# Patient Record
Sex: Male | Born: 1970
Health system: Southern US, Community
[De-identification: ages and names within clinical notes are randomized; demographics above are authoritative.]

## PROBLEM LIST (undated history)

## (undated) DIAGNOSIS — F32A Depression, unspecified: Secondary | ICD-10-CM

## (undated) DIAGNOSIS — K603 Anal fistula: Secondary | ICD-10-CM

## (undated) DIAGNOSIS — Z889 Allergy status to unspecified drugs, medicaments and biological substances status: Secondary | ICD-10-CM

## (undated) DIAGNOSIS — M1990 Unspecified osteoarthritis, unspecified site: Secondary | ICD-10-CM

## (undated) DIAGNOSIS — F329 Major depressive disorder, single episode, unspecified: Secondary | ICD-10-CM

## (undated) DIAGNOSIS — F1910 Other psychoactive substance abuse, uncomplicated: Secondary | ICD-10-CM

## (undated) DIAGNOSIS — F419 Anxiety disorder, unspecified: Secondary | ICD-10-CM

## (undated) DIAGNOSIS — R7302 Impaired glucose tolerance (oral): Secondary | ICD-10-CM

## (undated) DIAGNOSIS — R51 Headache: Secondary | ICD-10-CM

## (undated) HISTORY — DX: Impaired glucose tolerance (oral): R73.02

## (undated) HISTORY — DX: Unspecified osteoarthritis, unspecified site: M19.90

## (undated) HISTORY — DX: Anxiety disorder, unspecified: F41.9

## (undated) HISTORY — DX: Major depressive disorder, single episode, unspecified: F32.9

## (undated) HISTORY — DX: Depression, unspecified: F32.A

## (undated) HISTORY — DX: Other psychoactive substance abuse, uncomplicated: F19.10

## (undated) HISTORY — PX: WISDOM TOOTH EXTRACTION: SHX21

## (undated) HISTORY — DX: Anal fistula: K60.3

---

## 1998-11-21 HISTORY — PX: VASECTOMY: SHX75

## 1999-12-24 ENCOUNTER — Other Ambulatory Visit: Admission: RE | Admit: 1999-12-24 | Discharge: 1999-12-24 | Payer: Self-pay | Admitting: Urology

## 2003-03-04 ENCOUNTER — Encounter (INDEPENDENT_AMBULATORY_CARE_PROVIDER_SITE_OTHER): Payer: Self-pay | Admitting: *Deleted

## 2003-03-04 ENCOUNTER — Ambulatory Visit (HOSPITAL_BASED_OUTPATIENT_CLINIC_OR_DEPARTMENT_OTHER): Admission: RE | Admit: 2003-03-04 | Discharge: 2003-03-04 | Payer: Self-pay | Admitting: Oral Surgery

## 2004-09-03 ENCOUNTER — Emergency Department (HOSPITAL_COMMUNITY): Admission: EM | Admit: 2004-09-03 | Discharge: 2004-09-03 | Payer: Self-pay | Admitting: Emergency Medicine

## 2006-11-21 HISTORY — PX: ANTERIOR CRUCIATE LIGAMENT REPAIR: SHX115

## 2007-04-23 ENCOUNTER — Ambulatory Visit (HOSPITAL_COMMUNITY): Admission: RE | Admit: 2007-04-23 | Discharge: 2007-04-23 | Payer: Self-pay | Admitting: Specialist

## 2011-04-08 NOTE — Op Note (Signed)
NAME:  Danny [name], PACKETT NO.:  [id number]   MEDICAL RECORD NO.:  [id number]                   PATIENT TYPE:  AMB   LOCATION:  DSC                                  FACILITY:  [facility]   PHYSICIAN:  Hewitt Blade, D.D.S.             DATE OF BIRTH:  [birth year]   DATE OF PROCEDURE:  03/04/2003  DATE OF DISCHARGE:                                 OPERATIVE REPORT   PREOPERATIVE DIAGNOSIS:  4.0 cm intrabony cyst left posterior mandible.   POSTOPERATIVE DIAGNOSIS:  4.0 cm intrabony cyst left posterior mandible.   PROCEDURE:  Removal of 4.0 cm intrabony cyst and associated imbedded tooth  #17.   SURGEON:  Hewitt Blade, D.D.S.   ASSISTANT:  [place].   ANESTHESIA:  General via oral endotracheal intubation.   ESTIMATED BLOOD LOSS:  Less than 50 mL.   FLUIDS REPLACED:  Approximately 1000 mL crystalloid solution.   COMPLICATIONS:  None apparent.   INDICATIONS FOR PROCEDURE:  The patient is a 40 year old gentleman who was  referred to my office for evaluation of pain and swelling in the left  posterior mandible.  Panoramic photograph revealed a large intrabony cyst  approximating the left neurovascular bundle.  The cyst was seen to be  associated with an imbedded tooth that had been displaced lingually into the  ramus.  Due to the complex nature of removal of the cyst and the proximity  to the neurovascular bundle, it was recommended that the procedure be  performed under day surgical conditions.   DESCRIPTION OF PROCEDURE:  On March 04, 2003, the patient was taken to the  [facility] where he was placed on the operating  table in the supine position.  Following successful oral endotracheal  intubation and general anesthesia, the patient's face, neck, and oral cavity  were prepped and draped in the usual sterile operating room fashion.  The  hypopharynx was suctioned free of fluids and secretions and a moistened two  inch  vaginal pack was placed as a throat pack. Attention was then directed  intraorally, where approximately 8 mL of 0.5% Xylocaine containing 1:200,000  epinephrine were infiltrated in the soft tissues over the ascending ramus,  the right lateral soft tissues of the oblique ridge, and the left inferior  neurovascular region. A #15 Bard-Parker blade was then used to create a 2.5  curvilinear incision beginning in the soft tissues of the ramus and brought  through the soft tissues of the left mandibular oblique ridge. A #9 Malt  periosteal elevator was then used to reflect a full-thickness mucoperiosteal  flap laterally approximately 1.5 cm and lingually approximately 0.5 cm.  The  mandibular bone was then visualized and identified and an osseous window was  then created using a Stryker rotary osteotome with a #8 round bur and  copious irrigation.  The cystic cavity was then entered.  The imbedded tooth  was  then sectioned in its long and vertical axes using a Stryker rotary  osteotome and a 107 Fisher bur. The tooth was then subluxated from the ramus  and removed using Mosquito hemostats.   The cystic cavity was then gently teased from the margins of the mandible  and gently teased from the left neurovascular bundle. The cyst was submitted  for histologic examination.   The bony margins were then smoothed and contoured with a small osseous file.  The surgical area was thoroughly irrigated with sterile saline irrigating  solutions and suctioned. The mucoperiosteal margins were approximated and  sutured in a continuous interlocking fashion using 4-0 chromic suture  material on an FS2 needle. The throat pack was removed, and the hypopharynx  suctioned free of fluids and secretions.  The patient was then allowed to  awaken from the anesthesia and taken to the recovery room where he tolerated  the procedure well and without apparent complications.                                                Hewitt Blade, D.D.S.    DC/MEDQ  D:  03/04/2003  T:  03/04/2003  Job:  [id number]

## 2011-08-01 ENCOUNTER — Inpatient Hospital Stay (INDEPENDENT_AMBULATORY_CARE_PROVIDER_SITE_OTHER)
Admission: RE | Admit: 2011-08-01 | Discharge: 2011-08-01 | Disposition: A | Discharge status: Home / Self Care | Payer: 59 | Source: Ambulatory Visit | Attending: Emergency Medicine | Admitting: Emergency Medicine

## 2011-08-01 DIAGNOSIS — F1010 Alcohol abuse, uncomplicated: Secondary | ICD-10-CM

## 2011-08-01 DIAGNOSIS — F411 Generalized anxiety disorder: Secondary | ICD-10-CM

## 2011-12-27 ENCOUNTER — Telehealth (INDEPENDENT_AMBULATORY_CARE_PROVIDER_SITE_OTHER): Payer: Self-pay | Admitting: Surgery

## 2011-12-28 ENCOUNTER — Telehealth (INDEPENDENT_AMBULATORY_CARE_PROVIDER_SITE_OTHER): Payer: Self-pay

## 2011-12-28 NOTE — Telephone Encounter (Signed)
Returned pt's call about what to expect on day of appt with Dr Michaell Cowing. I explained to the pt that it will just be a consult visit to meet the doctor and go over his surgical options. I did say if the pt comes to the office this day with an pil.cyst infection that Dr Michaell Cowing would talk to pt about doing an I&D to try to relieve some of the infection b/c you cant' have surgery with an pil.cyst infection. The pt understands and will see [place] on 02-06-12.

## 2012-02-06 ENCOUNTER — Ambulatory Visit (INDEPENDENT_AMBULATORY_CARE_PROVIDER_SITE_OTHER): Payer: 59 | Admitting: Surgery

## 2012-02-06 ENCOUNTER — Encounter (INDEPENDENT_AMBULATORY_CARE_PROVIDER_SITE_OTHER): Payer: Self-pay | Admitting: Surgery

## 2012-02-06 VITALS — BP 136/88 | HR 80 | Temp 98.4°F | Resp 18 | Ht 70.0 in | Wt 230.2 lb

## 2012-02-06 DIAGNOSIS — F3181 Bipolar II disorder: Secondary | ICD-10-CM | POA: Insufficient documentation

## 2012-02-06 DIAGNOSIS — F39 Unspecified mood [affective] disorder: Secondary | ICD-10-CM | POA: Insufficient documentation

## 2012-02-06 DIAGNOSIS — K603 Anal fistula, unspecified: Secondary | ICD-10-CM

## 2012-02-06 DIAGNOSIS — R7302 Impaired glucose tolerance (oral): Secondary | ICD-10-CM | POA: Insufficient documentation

## 2012-02-06 HISTORY — DX: Anal fistula: K60.3

## 2012-02-06 HISTORY — DX: Anal fistula, unspecified: K60.30

## 2012-02-06 NOTE — Patient Instructions (Signed)
Anal Fistula An anal fistula is an abnormal tunnel that leads from the anal canal (which carries stool from the large intestine) to a hole in the skin near the anus (the opening through which stool passes out of your body).  CAUSES  Food you eat goes from your stomach into your intestine. As the food is digested, waste material (stool) forms. Stool passes through your large intestine, through the rectum and anal canal, and out of your body through the anus.  The anus has a number of tiny glands (clusters of specialized cells) that make lubricating fluid. Sometimes these glands can become infected. This type of infection may lead to the development of a pocket of pus (abscess). An anal fistula often develops after an infection or abscess; It is nearly always caused by a past anorectal abscess. You are at a higher risk of developing an anal fistula if you have:  Had an anal abscess.   Chronic inflammatory bowel disease, such as Crohn's disease or ulcerative colitis.   Conditions in which there are inflamed outpouchings of the intestinal wall (diverticulitis).   Colon or rectal cancer.   Sexually transmitted diseases involving the rectum, such as gonorrhea or chlamydia.   A history of anal radiation treatments, injury, or surgery.   An HIV infection.   A problem that has required treatment with steroid medicines for more than a short time.  SYMPTOMS   Anal pain, particularly around the area of a past abscess.   Drainage of pus, blood, stool or mucus from an opening in the skin.   Swelling around the skin opening.   Worn off skin around the opening.   A hot or red area near the anus.   Diarrhea.   Fever and chills.   Tiredness (fatigue).  DIAGNOSIS   In some cases, the opening of an anal fistula is easily seen during a physical exam.   A probe or scope may be used to help locate the opening of the fistula. In some cases, dye can be injected into the fistula opening, and X-rays  can be taken to find the exact location and path of the fistula.   A sample (biopsy) of the fistula tissue or anus may be taken to check for cancer.  TREATMENT   An anal fistula may need surgery to open it up and allow it to heal. This type of operation is called a fistulotomy.   A specialized kind of glue or plug to seal the fistula may be used.   An antibiotic may be prescribed to treat an existing infection.  HOME CARE INSTRUCTIONS   Take medications (such as antibiotics) as prescribed by your caregiver.   Only take over-the-counter or prescription medicine for pain, discomfort, or fever as directed by your caregiver.   Follow your prescribed diet. You may need a higher fiber diet to help avoid constipation.   Drink lots of water as directed.   Use a stool softener or laxative, if recommended.   A warm sitz bath several times a day may be soothing, as well as help with healing.   Follow excellent hygiene to keep the anal area as clean as possible. Consider using pre-moistened towelettes to keep the anal area clean after using the bathroom.  SEEK MEDICAL CARE IF:  You have increased pain not controlled with medications.   You notice new swelling, redness, or hotness in the anal area.   You develop any problems passing urine.   You develop a fever (more   than 100.5 F (38.1 C).  SEEK IMMEDIATE MEDICAL CARE IF:  You have severe, intolerable pain.   You have severe problems passing urine or cannot pass any urine at all.   You develop an unexplained oral temperature above 102.0 F (38.9 C).   You notice new or worsening leakage of blood, pus, mucus, or stool.  Document Released: 10/20/2008 Document Revised: 10/27/2011 Document Reviewed: 10/20/2008 [facility] Patient Information 2012 [place], [place].  GETTING TO GOOD BOWEL HEALTH. Irregular bowel habits such as constipation and diarrhea can lead to many problems over time.  Having one soft bowel movement a day is the most  important way to prevent further problems.  The anorectal canal is designed to handle stretching and feces to safely manage our ability to get rid of solid waste (feces, poop, stool) out of our body.  BUT, hard constipated stools can act like ripping concrete bricks and diarrhea can be a burning fire to this very sensitive area of our body, causing inflamed hemorrhoids, anal fissures, increasing risk is perirectal abscesses, abdominal pain/bloating, an making irritable bowel worse.     The goal: ONE SOFT BOWEL MOVEMENT A DAY!  To have soft, regular bowel movements:    Drink at least 8 tall glasses of water a day.     Take plenty of fiber.  Fiber is the undigested part of plant food that passes into the colon, acting s "natures broom" to encourage bowel motility and movement.  Fiber can absorb and hold large amounts of water. This results in a larger, bulkier stool, which is soft and easier to pass. Work gradually over several weeks up to 6 servings a day of fiber (25g a day even more if needed) in the form of: o Vegetables -- Root (potatoes, carrots, turnips), leafy green (lettuce, salad greens, celery, spinach), or cooked high residue (cabbage, broccoli, etc) o Fruit -- Fresh (unpeeled skin & pulp), Dried (prunes, apricots, cherries, etc ),  or stewed ( applesauce)  o Whole grain breads, pasta, etc (whole wheat)  o Bran cereals    Bulking Agents -- This type of water-retaining fiber generally is easily obtained each day by one of the following:  o Psyllium bran -- The psyllium plant is remarkable because its ground seeds can retain so much water. This product is available as Metamucil, Konsyl, Effersyllium, Per Diem Fiber, or the less expensive generic preparation in drug and health food stores. Although labeled a laxative, it really is not a laxative.  o Methylcellulose -- This is another fiber derived from wood which also retains water. It is available as Citrucel. o Polyethylene Glycol - and  "artificial" fiber commonly called Miralax or Glycolax.  It is helpful for people with gassy or bloated feelings with regular fiber o Flax Seed - a less gassy fiber than psyllium   No reading or other relaxing activity while on the toilet. If bowel movements take longer than 5 minutes, you are too constipated   AVOID CONSTIPATION.  High fiber and water intake usually takes care of this.  Sometimes a laxative is needed to stimulate more frequent bowel movements, but    Laxatives are not a good long-term solution as it can wear the colon out. o Osmotics (Milk of Magnesia, Fleets phosphosoda, Magnesium citrate, MiraLax, GoLytely) are safer than  o Stimulants (Senokot, Castor Oil, Dulcolax, Ex Lax)    o Do not take laxatives for more than 7days in a row.    IF SEVERELY CONSTIPATED, try a Bowel Retraining Program:  o Do not use laxatives.  o Eat a diet high in roughage, such as bran cereals and leafy vegetables.  o Drink six (6) ounces of prune or apricot juice each morning.  o Eat two (2) large servings of stewed fruit each day.  o Take one (1) heaping tablespoon of a psyllium-based bulking agent twice a day. Use sugar-free sweetener when possible to avoid excessive calories.  o Eat a normal breakfast.  o Set aside 15 minutes after breakfast to sit on the toilet, but do not strain to have a bowel movement.  o If you do not have a bowel movement by the third day, use an enema and repeat the above steps.    Controlling diarrhea o Switch to liquids and simpler foods for a few days to avoid stressing your intestines further. o Avoid dairy products (especially milk & ice cream) for a short time.  The intestines often can lose the ability to digest lactose when stressed. o Avoid foods that cause gassiness or bloating.  Typical foods include beans and other legumes, cabbage, broccoli, and dairy foods.  Every person has some sensitivity to other foods, so listen to our body and avoid those foods that trigger  problems for you. o Adding fiber (Citrucel, Metamucil, psyllium, Miralax) gradually can help thicken stools by absorbing excess fluid and retrain the intestines to act more normally.  Slowly increase the dose over a few weeks.  Too much fiber too soon can backfire and cause cramping & bloating. o Probiotics (such as active yogurt, Align, etc) may help repopulate the intestines and colon with normal bacteria and calm down a sensitive digestive tract.  Most studies show it to be of mild help, though, and such products can be costly. o Medicines:   Bismuth subsalicylate (ex. Kayopectate, Pepto Bismol) every 30 minutes for up to 6 doses can help control diarrhea.  Avoid if pregnant.   Loperamide (Immodium) can slow down diarrhea.  Start with two tablets (4mg  total) first and then try one tablet every 6 hours.  Avoid if you are having fevers or severe pain.  If you are not better or start feeling worse, stop all medicines and call your doctor for advice o Call your doctor if you are getting worse or not better.  Sometimes further testing (cultures, endoscopy, X-ray studies, bloodwork, etc) may be needed to help diagnose and treat the cause of the diarrhea. o

## 2012-02-06 NOTE — Progress Notes (Signed)
Subjective:     Patient ID: Danny [name], male   DOB: Dec 27, 1970, 41 y.o.   MRN: [id number]  HPI  FAHEEM ZIEMANN  Nov 07, 1971 [id number]  Patient Care Team: Berenda Morale, MD as PCP - General (Family Medicine)  This patient is a 41 y.o.male who presents today for surgical evaluation at the request of Dr. Larwance Sachs.   Reason for visit: Recurrent abscess he is near tailbone. Question of pilonidal disease.  The patient is a pleasant healthy male. 8 months ago, he noted a painful lump between his anus and tailbone. It came to a head and drained. It has continued to drain. It intermittently swells. He's never had anything like this before. No history of inflammatory bowel disease or Crohn's. No history of fall or trauma. No history of skin infections her MRSA.  Because it will not go away, he burned up to his primary care physician. They requested surgical evaluation. They were concerned about probable pilonidal disease  Patient Active Problem List  Diagnoses  . Anxiety and depression  . Impaired glucose tolerance    Past Medical History  Diagnosis Date  . Pilonidal cyst   . Anxiety and depression   . Impaired glucose tolerance     Past Surgical History  Procedure Date  . Vasectomy 2000  . Anterior cruciate ligament repair 2008    right    History   Social History  . Marital Status: Married    Spouse Name: N/A    Number of Children: N/A  . Years of Education: N/A   Occupational History  . Not on file.   Social History Main Topics  . Smoking status: Former Smoker    Quit date: 11/21/2009  . Smokeless tobacco: Not on file  . Alcohol Use: Yes  . Drug Use: No  . Sexually Active:    Other Topics Concern  . Not on file   Social History Narrative  . No narrative on file    Family History  Problem Relation Age of Onset  . Cancer Father     prostate  . Heart disease Maternal Grandfather   . Cancer Paternal Grandfather     lung    Current outpatient  prescriptions:ALPRAZolam (XANAX) 0.5 MG tablet, Take 0.5 mg by mouth at bedtime as needed., Disp: , Rfl: ;  buPROPion (WELLBUTRIN XL) 300 MG 24 hr tablet, Take 300 mg by mouth daily., Disp: , Rfl:   No Known Allergies  BP 136/88  Pulse 80  Temp(Src) 98.4 F (36.9 C) (Temporal)  Resp 18  Ht 5\' 10"  (1.778 m)  Wt 230 lb 3.2 oz (104.418 kg)  BMI 33.03 kg/m2     Review of Systems  Constitutional: Negative for fever, chills and diaphoresis.  HENT: Negative for nosebleeds, sore throat, facial swelling, mouth sores, trouble swallowing and ear discharge.   Eyes: Negative for photophobia, discharge and visual disturbance.  Respiratory: Negative for choking, chest tightness, shortness of breath and stridor.   Cardiovascular: Negative for chest pain and palpitations.       Patient walks 60 minutes for about 2 miles without difficulty.  No exertional chest/neck/shoulder/arm pain.   Gastrointestinal: Negative for nausea, vomiting, abdominal pain, diarrhea, constipation, blood in stool, abdominal distention, anal bleeding and rectal pain.       No personal nor family history of GI/colon cancer, inflammatory bowel disease, irritable bowel syndrome, allergy such as Celiac Sprue, dietary/dairy problems, colitis, ulcers nor gastritis.    No recent sick contacts/gastroenteritis.  No travel outside  the country.  No changes in diet.    Genitourinary: Negative for dysuria, urgency, difficulty urinating and testicular pain.  Musculoskeletal: Negative for myalgias, back pain, arthralgias and gait problem.  Skin: Negative for color change, pallor, rash and wound.  Neurological: Negative for dizziness, speech difficulty, weakness, numbness and headaches.  Hematological: Negative for adenopathy. Does not bruise/bleed easily.  Psychiatric/Behavioral: Negative for hallucinations, confusion and agitation.       Objective:   Physical Exam  Constitutional: He is oriented to person, place, and time. He  appears well-developed and well-nourished. No distress.  HENT:  Head: Normocephalic.  Mouth/Throat: Oropharynx is clear and moist. No oropharyngeal exudate.  Eyes: EOM and lids are normal. Pupils are equal, round, and reactive to light. Right conjunctiva is injected. Right conjunctiva has no hemorrhage. Left conjunctiva is injected. Left conjunctiva has no hemorrhage. No scleral icterus.  Neck: Normal range of motion. Neck supple. No tracheal deviation present.  Cardiovascular: Normal rate, regular rhythm and intact distal pulses.   Pulmonary/Chest: Effort normal and breath sounds normal. No respiratory distress.  Abdominal: Soft. He exhibits no distension. There is no tenderness. Hernia confirmed negative in the right inguinal area and confirmed negative in the left inguinal area.  Genitourinary: Penis normal. No penile tenderness.       Perianal skin clean with good hygiene.  No pruritis.  No pilonidal disease.  No fissure.    Posterior midline 3cm from anus is 1.5cm firm mass with pinhole - hard cord to the anus.  = Probable fistula.  No abscess.  No fluid out pinhole with DRE  Tolerates digital rectal exam.  Normal sphincter tone.  Prostate WNL. No rectal masses.  Hemorrhoidal piles WNL.     Musculoskeletal: Normal range of motion. He exhibits no tenderness.  Lymphadenopathy:    He has no cervical adenopathy.       Right: No inguinal adenopathy present.       Left: No inguinal adenopathy present.  Neurological: He is alert and oriented to person, place, and time. No cranial nerve deficit. He exhibits normal muscle tone. Coordination normal.  Skin: Skin is warm and dry. No rash noted. He is not diaphoretic. No erythema. No pallor.  Psychiatric: He has a normal mood and affect. His behavior is normal. Judgment and thought content normal.       Assessment:     Probable perirectal fistula    Plan:     It seems to close to the anus to be pilonidal disease. It is too far away. There  are no pinhole on the sacrum.  I think that this is an anal fistula. The rectum does point posteriorly. I do not feel an internal abscess.I think he would benefit from examination under anesthesia with probable fistulotomy / LIFT procedure:  The anatomy & physiology of the anorectal region was discussed.  The pathophysiology of anorectal abscess and fistula.  Differential diagnosis was discussed.  Natural history progression was discussed.   I stressed the importance of a bowel regimen to have daily soft bowel movements to minimize progression of disease.     The patient's symptoms are not adequately controlled.  Non-operative treatment has not healed the fistula.  Therefore, I recommended examination under anaesthesia to confirm the diagnosis and treat the fistula.  I discussed techniques that may be required such as fistulotomy, ligation by LIFT technique, and/or seton placement.  Benefits & alternatives discussed.  I noted a good likelihood this will help address the problem.  Risks such  as bleeding, pain, recurrence, reoperation, heart attack, death, and other risks were discussed.      Educational handouts further explaining the pathology, treatment options, and bowel regimen were given.  The patient expressed understanding & wishes to proceed.  We will work to coordinate surgery for a mutual convenient time.

## 2012-02-22 ENCOUNTER — Encounter (HOSPITAL_COMMUNITY): Payer: Self-pay | Admitting: Pharmacy Technician

## 2012-03-06 ENCOUNTER — Encounter (HOSPITAL_COMMUNITY)
Admission: RE | Admit: 2012-03-06 | Discharge: 2012-03-06 | Disposition: A | Discharge status: Home / Self Care | Payer: 59 | Source: Ambulatory Visit | Attending: Surgery | Admitting: Surgery

## 2012-03-06 ENCOUNTER — Encounter (HOSPITAL_COMMUNITY): Payer: Self-pay

## 2012-03-06 HISTORY — DX: Allergy status to unspecified drugs, medicaments and biological substances: Z88.9

## 2012-03-06 HISTORY — DX: Headache: R51

## 2012-03-06 HISTORY — DX: Anxiety disorder, unspecified: F41.9

## 2012-03-06 LAB — BASIC METABOLIC PANEL
BUN: 17 mg/dL (ref 6–23)
CO2: 26 mEq/L (ref 19–32)
Calcium: 9.2 mg/dL (ref 8.4–10.5)
Chloride: 101 mEq/L (ref 96–112)
Creatinine, Ser: 0.88 mg/dL (ref 0.50–1.35)
GFR calc Af Amer: >90 mL/min (ref >90)
GFR calc non Af Amer: >90 mL/min (ref >90)
Glucose, Bld: 95 mg/dL (ref 70–99)
Potassium: 4.4 mEq/L (ref 3.5–5.1)
Sodium: 135 mEq/L (ref 135–145)

## 2012-03-06 LAB — CBC
HCT: 45.4 % (ref 39.0–52.0)
Hemoglobin: 15.6 g/dL (ref 13.0–17.0)
MCH: 30.2 pg (ref 26.0–34.0)
MCHC: 34.4 g/dL (ref 30.0–36.0)
MCV: 88 fL (ref 78.0–100.0)
Platelets: 213 10*3/uL (ref 150–400)
RBC: 5.16 MIL/uL (ref 4.22–5.81)
RDW: 12.5 % (ref 11.5–15.5)
WBC: 4.4 10*3/uL (ref 4.0–10.5)

## 2012-03-06 LAB — SURGICAL PCR SCREEN
MRSA, PCR: NEGATIVE
Staphylococcus aureus: NEGATIVE

## 2012-03-06 NOTE — Patient Instructions (Addendum)
20 Danny [name]  03/06/2012   Your procedure is scheduled on: 03/08/12  Surgery 1351pm-1521pm   Report to [facility] at    1120   AM.  Call this number if you have problems the morning of surgery: [phone]     Or PST   [id number]  [facility]   Remember:              Stop antiinflammatories, herbal  meds 4 days before surgery.  Do not eat food:After Midnight. Wednesday NIGHT  NO LIQUIDS AFTER MIDNIGHT Wednesday NIGHT  Clear liquids include soda, tea, black coffee, apple or grape juice, broth.  Take these medicines the morning of surgery with A SIP OF WATER: wellbutrin, aprazolam   Do not wear jewelry, make-up or nail polish.  Do not wear lotions, powders, or perfumes. You may wear deodorant.  Do not shave 48 hours prior to surgery.  Do not bring valuables to the hospital.  Contacts, dentures or bridgework may not be worn into surgery.  Leave suitcase in the car. After surgery it may be brought to your room.  For patients admitted to the hospital, checkout time is 11:00 AM the day of discharge.   Patients discharged the day of surgery will not be allowed to drive home.  Name and phone number of your driver:                                                                      Special Instructions: CHG Shower Use Special Wash: 1/2 bottle night before surgery and 1/2 bottle morning of surgery. REGULAR SOAP FACE AND PRIVATES                        MEN-MAY SHAVE FACE MORNING OF SURGERY  Please read over the following fact sheets that you were given: MRSA Information

## 2012-03-08 ENCOUNTER — Ambulatory Visit (HOSPITAL_COMMUNITY): Payer: 59 | Admitting: Anesthesiology

## 2012-03-08 ENCOUNTER — Encounter (HOSPITAL_COMMUNITY)
Admission: RE | Disposition: A | Discharge status: Home / Self Care | Payer: Self-pay | Source: Ambulatory Visit | Attending: Surgery

## 2012-03-08 ENCOUNTER — Encounter (HOSPITAL_COMMUNITY): Payer: Self-pay | Admitting: *Deleted

## 2012-03-08 ENCOUNTER — Encounter (HOSPITAL_COMMUNITY): Payer: Self-pay | Admitting: Anesthesiology

## 2012-03-08 ENCOUNTER — Ambulatory Visit (HOSPITAL_COMMUNITY)
Admission: RE | Admit: 2012-03-08 | Discharge: 2012-03-08 | Disposition: A | Discharge status: Home / Self Care | Payer: 59 | Source: Ambulatory Visit | Attending: Surgery | Admitting: Surgery

## 2012-03-08 DIAGNOSIS — K603 Anal fistula, unspecified: Secondary | ICD-10-CM | POA: Insufficient documentation

## 2012-03-08 DIAGNOSIS — Z01812 Encounter for preprocedural laboratory examination: Secondary | ICD-10-CM | POA: Insufficient documentation

## 2012-03-08 HISTORY — PX: ANAL FISTULECTOMY: SHX1139

## 2012-03-08 SURGERY — EXAM UNDER ANESTHESIA WITH ANAL FISTULECTOMY
Anesthesia: General | Site: Rectum | Wound class: Dirty or Infected

## 2012-03-08 MED ORDER — ACETAMINOPHEN 10 MG/ML IV SOLN
INTRAVENOUS | Status: DC | PRN
  Administered 2012-03-08: 1000 mg via INTRAVENOUS

## 2012-03-08 MED ORDER — LABETALOL HCL 5 MG/ML IV SOLN
INTRAVENOUS | Status: DC | PRN
  Administered 2012-03-08: 5 mg via INTRAVENOUS

## 2012-03-08 MED ORDER — ONDANSETRON HCL 4 MG/2ML IJ SOLN
INTRAMUSCULAR | Status: DC | PRN
  Administered 2012-03-08: 4 mg via INTRAVENOUS

## 2012-03-08 MED ORDER — OXYCODONE HCL 5 MG PO TABS: 5.0000 mg | ORAL_TABLET | ORAL | Status: AC | PRN

## 2012-03-08 MED ORDER — BUPIVACAINE LIPOSOME 1.3 % IJ SUSP
20.0000 mL | INTRAMUSCULAR | Status: AC
  Administered 2012-03-08: 20 mL
  Filled 2012-03-08: qty 20

## 2012-03-08 MED ORDER — DROPERIDOL 2.5 MG/ML IJ SOLN
INTRAMUSCULAR | Status: DC | PRN
  Administered 2012-03-08: 0.625 mg via INTRAVENOUS

## 2012-03-08 MED ORDER — BUPIVACAINE-EPINEPHRINE PF 0.25-1:200000 % IJ SOLN
INTRAMUSCULAR | Status: AC
  Filled 2012-03-08: qty 30

## 2012-03-08 MED ORDER — MIDAZOLAM HCL 5 MG/5ML IJ SOLN
INTRAMUSCULAR | Status: DC | PRN
  Administered 2012-03-08: 1 mg via INTRAVENOUS
  Administered 2012-03-08: 2 mg via INTRAVENOUS

## 2012-03-08 MED ORDER — METHYLENE BLUE 1 % INJ SOLN
INTRAMUSCULAR | Status: AC
Start: 2012-03-08 — End: 2012-03-08
  Filled 2012-03-08: qty 10

## 2012-03-08 MED ORDER — OXYCODONE HCL 5 MG PO TABS
ORAL_TABLET | ORAL | Status: AC
  Administered 2012-03-08: 5 mg via ORAL
  Filled 2012-03-08: qty 1

## 2012-03-08 MED ORDER — SUCCINYLCHOLINE CHLORIDE 20 MG/ML IJ SOLN
INTRAMUSCULAR | Status: DC | PRN
  Administered 2012-03-08: 100 mg via INTRAVENOUS

## 2012-03-08 MED ORDER — PROPOFOL 10 MG/ML IV EMUL
INTRAVENOUS | Status: DC | PRN
  Administered 2012-03-08: 180 mg via INTRAVENOUS
  Administered 2012-03-08: 20 mg via INTRAVENOUS

## 2012-03-08 MED ORDER — DEXTROSE 5 % IV SOLN: 1.0000 g | INTRAVENOUS | Status: DC | PRN

## 2012-03-08 MED ORDER — HYDROMORPHONE HCL PF 1 MG/ML IJ SOLN
INTRAMUSCULAR | Status: DC | PRN
  Administered 2012-03-08: 1 mg via INTRAVENOUS

## 2012-03-08 MED ORDER — CEFOXITIN SODIUM-DEXTROSE 1-4 GM-% IV SOLR (PREMIX)
INTRAVENOUS | Status: AC
  Filled 2012-03-08: qty 100

## 2012-03-08 MED ORDER — ACETAMINOPHEN 10 MG/ML IV SOLN
INTRAVENOUS | Status: AC
  Filled 2012-03-08: qty 100

## 2012-03-08 MED ORDER — LIDOCAINE HCL (CARDIAC) 20 MG/ML IV SOLN
INTRAVENOUS | Status: DC | PRN
  Administered 2012-03-08: 80 mg via INTRAVENOUS

## 2012-03-08 MED ORDER — LIDOCAINE HCL 4 % MT SOLN
OROMUCOSAL | Status: DC | PRN
  Administered 2012-03-08: 4 mL via TOPICAL

## 2012-03-08 MED ORDER — DEXTROSE 5 % IV SOLN
2.0000 g | INTRAVENOUS | Status: AC
  Administered 2012-03-08: 2 g via INTRAVENOUS
  Filled 2012-03-08: qty 2

## 2012-03-08 MED ORDER — LACTATED RINGERS IV SOLN
INTRAVENOUS | Status: DC | PRN
  Administered 2012-03-08 (×2): via INTRAVENOUS

## 2012-03-08 MED ORDER — OXYCODONE HCL 5 MG PO TABS
5.0000 mg | ORAL_TABLET | ORAL | Status: DC | PRN
  Administered 2012-03-08: 5 mg via ORAL

## 2012-03-08 MED ORDER — SUFENTANIL CITRATE 50 MCG/ML IV SOLN
INTRAVENOUS | Status: DC | PRN
  Administered 2012-03-08: 10 ug via INTRAVENOUS
  Administered 2012-03-08: 5 ug via INTRAVENOUS
  Administered 2012-03-08: 10 ug via INTRAVENOUS

## 2012-03-08 MED ORDER — HYDROMORPHONE HCL PF 1 MG/ML IJ SOLN: 0.2500 mg | INTRAMUSCULAR | Status: DC | PRN

## 2012-03-08 SURGICAL SUPPLY — 34 items
BLADE HEX COATED 2.75 (ELECTRODE) ×2 IMPLANT
BLADE SURG 15 STRL LF DISP TIS (BLADE) ×1 IMPLANT
BLADE SURG 15 STRL SS (BLADE) ×2
BRIEF STRETCH FOR OB PAD LRG (UNDERPADS AND DIAPERS) ×2 IMPLANT
CANISTER SUCTION 2500CC (MISCELLANEOUS) ×2 IMPLANT
CLOTH BEACON ORANGE TIMEOUT ST (SAFETY) ×2 IMPLANT
DECANTER SPIKE VIAL GLASS SM (MISCELLANEOUS) ×2 IMPLANT
DRSG PAD ABDOMINAL 8X10 ST (GAUZE/BANDAGES/DRESSINGS) ×1 IMPLANT
ELECT REM PT RETURN 9FT ADLT (ELECTROSURGICAL) ×2
ELECTRODE REM PT RTRN 9FT ADLT (ELECTROSURGICAL) ×1 IMPLANT
GAUZE PACKING IODOFORM 1/2 (PACKING) ×2 IMPLANT
GAUZE SPONGE 4X4 16PLY XRAY LF (GAUZE/BANDAGES/DRESSINGS) ×2 IMPLANT
GLOVE BIOGEL PI IND STRL 7.0 (GLOVE) ×1 IMPLANT
GLOVE BIOGEL PI INDICATOR 7.0 (GLOVE) ×1
GOWN STRL NON-REIN LRG LVL3 (GOWN DISPOSABLE) ×2 IMPLANT
GOWN STRL REIN XL XLG (GOWN DISPOSABLE) ×4 IMPLANT
KIT BASIN OR (CUSTOM PROCEDURE TRAY) ×2 IMPLANT
LUBRICANT JELLY K Y 4OZ (MISCELLANEOUS) ×2 IMPLANT
NDL HYPO 25X1 1.5 SAFETY (NEEDLE) ×1 IMPLANT
NDL SAFETY ECLIPSE 18X1.5 (NEEDLE) IMPLANT
NEEDLE HYPO 18GX1.5 SHARP (NEEDLE)
NEEDLE HYPO 25X1 1.5 SAFETY (NEEDLE) ×2 IMPLANT
NS IRRIG 1000ML POUR BTL (IV SOLUTION) ×2 IMPLANT
PACK LITHOTOMY IV (CUSTOM PROCEDURE TRAY) ×2 IMPLANT
PENCIL BUTTON HOLSTER BLD 10FT (ELECTRODE) ×2 IMPLANT
SPONGE GAUZE 4X4 12PLY (GAUZE/BANDAGES/DRESSINGS) ×2 IMPLANT
SPONGE SURGIFOAM ABS GEL 100 (HEMOSTASIS) IMPLANT
SPONGE SURGIFOAM ABS GEL 12-7 (HEMOSTASIS) IMPLANT
SUT CHROMIC 2 0 SH (SUTURE) IMPLANT
SUT CHROMIC 3 0 SH 27 (SUTURE) IMPLANT
SYR CONTROL 10ML LL (SYRINGE) ×2 IMPLANT
TOWEL OR 17X26 10 PK STRL BLUE (TOWEL DISPOSABLE) ×3 IMPLANT
UNDERPAD 30X30 INCONTINENT (UNDERPADS AND DIAPERS) ×2 IMPLANT
YANKAUER SUCT BULB TIP 10FT TU (MISCELLANEOUS) ×2 IMPLANT

## 2012-03-08 NOTE — Discharge Instructions (Signed)
ANORECTAL SURGERY: POST OP INSTRUCTIONS  1. Take your usually prescribed home medications unless otherwise directed. 2. DIET: Follow a light bland diet the first 24 hours after arrival home, such as soup, liquids, crackers, etc.  Be sure to include lots of fluids daily.  Avoid fast food or heavy meals as your are more likely to get nauseated.  Eat a low fat the next few days after surgery.   3. PAIN CONTROL: a. Pain is best controlled by a usual combination of three different methods TOGETHER: i. Ice/Heat ii. Over the counter pain medication iii. Prescription pain medication b. Most patients will experience some swelling and discomfort in the anus/rectal area. and incisions.  Ice packs or heat (30-60 minutes up to 6 times a day) will help. Use ice for the first few days to help decrease swelling and bruising, then switch to heat such as warm towels, sitz baths, warm baths, etc to help relax tight/sore spots and speed recovery.  Some people prefer to use ice alone, heat alone, alternating between ice & heat.  Experiment to what works for you.  Swelling and bruising can take several weeks to resolve.   c. It is helpful to take an over-the-counter pain medication regularly for the first few weeks.  Choose one of the following that works best for you: i. Naproxen (Aleve, etc)  Two 220mg tabs twice a day ii. Ibuprofen (Advil, etc) Three 200mg tabs four times a day (every meal & bedtime) iii. Acetaminophen (Tylenol, etc) 500-650mg four times a day (every meal & bedtime) d. A  prescription for pain medication (such as oxycodone, hydrocodone, etc) should be given to you upon discharge.  Take your pain medication as prescribed.  i. If you are having problems/concerns with the prescription medicine (does not control pain, nausea, vomiting, rash, itching, etc), please call us [phone] to see if we need to switch you to a different pain medicine that will work better for you and/or control your side effect  better. ii. If you need a refill on your pain medication, please contact your pharmacy.  They will contact our office to request authorization. Prescriptions will not be filled after 5 pm or on week-ends. 4. KEEP YOUR BOWELS REGULAR a. The goal is one bowel movement a day b. Avoid getting constipated.  Between the surgery and the pain medications, it is common to experience some constipation.  Increasing fluid intake and taking a fiber supplement (such as Metamucil, Citrucel, FiberCon, MiraLax, etc) 1-2 times a day regularly will usually help prevent this problem from occurring.  A mild laxative (prune juice, Milk of Magnesia, MiraLax, etc) should be taken according to package directions if there are no bowel movements after 48 hours. c. Watch out for diarrhea.  If you have many loose bowel movements, simplify your diet to bland foods & liquids for a few days.  Stop any stool softeners and decrease your fiber supplement.  Switching to mild anti-diarrheal medications (Kayopectate, Pepto Bismol) can help.  If this worsens or does not improve, please call us.  5. Wound Care a. Remove your bandages the day after surgery.  Unless discharge instructions indicate otherwise, leave your bandage dry and in place overnight.  Remove the bandage during your first bowel movement.   b. Allow the wound packing to fall out over the next few days.  You can trim exposed gauze / ribbon as it falls out.  You do not need to repack the wound unless instructed otherwise.  Wear an   absorbent pad or soft cotton gauze in your underwear as needed to catch any drainage and help keep the area  c. Keep the area clean and dry.  Bathe / shower every day.  Keep the area clean by showering / bathing over the incision / wound.   It is okay to soak an open wound to help wash it.  Wet wipes or showers / gentle washing after bowel movements is often less traumatic than regular toilet paper. d. You may have some styrofoam-like soft packing in  the rectum which will come out with the first bowel movement.  e. You will often notice bleeding with bowel movements.  This should slow down by the end of the first week of surgery f. Expect some drainage.  This should slow down, too, by the end of the first week of surgery.  Wear an absorbent pad or soft cotton gauze in your underwear until the drainage stops. 6. ACTIVITIES as tolerated:   a. You may resume regular (light) daily activities beginning the next day--such as daily self-care, walking, climbing stairs--gradually increasing activities as tolerated.  If you can walk 30 minutes without difficulty, it is safe to try more intense activity such as jogging, treadmill, bicycling, low-impact aerobics, swimming, etc. b. Save the most intensive and strenuous activity for last such as sit-ups, heavy lifting, contact sports, etc  Refrain from any heavy lifting or straining until you are off narcotics for pain control.   c. DO NOT PUSH THROUGH PAIN.  Let pain be your guide: If it hurts to do something, don't do it.  Pain is your body warning you to avoid that activity for another week until the pain goes down. d. You may drive when you are no longer taking prescription pain medication, you can comfortably sit for long periods of time, and you can safely maneuver your car and apply brakes. e. You may have sexual intercourse when it is comfortable.  7. FOLLOW UP in our office a. Please call [facility] at [phone] to set up an appointment to see your surgeon in the office for a follow-up appointment approximately 2 weeks after your surgery. b. Make sure that you call for this appointment the day you arrive home to insure a convenient appointment time. 10. IF YOU HAVE DISABILITY OR FAMILY LEAVE FORMS, BRING THEM TO THE OFFICE FOR PROCESSING.  DO NOT GIVE THEM TO YOUR DOCTOR.        WHEN TO CALL US [phone]: 1. Poor pain control 2. Reactions / problems with new medications (rash/itching, nausea,  etc)  3. Fever over 101.5 F (38.5 C) 4. Inability to urinate 5. Nausea and/or vomiting 6. Worsening swelling or bruising 7. Continued bleeding from incision. 8. Increased pain, redness, or drainage from the incision  The clinic staff is available to answer your questions during regular business hours (8:30am-5pm).  Please don't hesitate to call and ask to speak to one of our nurses for clinical concerns.   A surgeon from [facility] is always on call at the hospitals   If you have a medical emergency, go to the nearest emergency room or call 911.    [facility] [address], [address], [place], [place]  [address] ? MAIN: [phone] ? TOLL FREE: [phone] ? FAX [phone] [email or web address]    

## 2012-03-08 NOTE — Op Note (Addendum)
03/08/2012  4:57 PM  PATIENT:  Danny [name]  41 y.o. male  Patient Care Team: Kandyce Rud, MD as PCP - General (Family Medicine)  PRE-OPERATIVE DIAGNOSIS:  Perianal fistula, posterior midline  POST-OPERATIVE DIAGNOSIS:  Perirectal intersphincteric fistula, posterior midline  PROCEDURE:  Procedure(s): EXAM UNDER ANESTHESIA WITH ANAL FISTULECTOMY by LIFT (Ligation of Intersphincteric Tract)  SURGEON:  Surgeon(s): Ardeth Sportsman, MD  ASSISTANT: none   ANESTHESIA:   local and general  EBL:  Total I/O In: 1000 [I.V.:1000] Out: -   Delay start of Pharmacological VTE agent (>24hrs) due to surgical blood loss or risk of bleeding:  no  DRAINS: none   SPECIMEN:  Source of Specimen:  Perirectal fistulous tact (stitch at skin, other end intersphincteric)  DISPOSITION OF SPECIMEN:  PATHOLOGY  COUNTS:  YES  PLAN OF CARE: Discharge to home after PACU  PATIENT DISPOSITION:  PACU - hemodynamically stable.  INDICATION: Patient is a pleasant male with recurrent perirectal abscesses. He was sent to me with concerns. I'm concerned about a chronic fistulous tract. I recommended examination under anesthesia with possible hysterectomy/fistulotomy:  The anatomy & physiology of the anorectal region was discussed.  We discussed the pathophysiology of anorectal abscess and fistula.  Differential diagnosis was discussed.  Natural history progression was discussed.   I stressed the importance of a bowel regimen to have daily soft bowel movements to minimize progression of disease.     The patient's condition is not adequately controlled.  Non-operative treatment has not healed the fistula.  Therefore, I recommended examination under anaesthesia to confirm the diagnosis and treat the fistula.  I discussed techniques that may be required such as fistulotomy, ligation by LIFT technique, and/or seton placement.  Benefits & alternatives discussed.  I noted a good likelihood this will help address the  problem, but sometimes repeat operations and prolonged healing times may occur.  Risks such as bleeding, pain, recurrence, reoperation, incontinence, heart attack, death, and other risks were discussed.      The patient expressed understanding & wishes to proceed.    OR FINDINGS: He had a posterior midline intersphincteric fistula. It was rather scarred down. It went to a right posterior anal crypt.  DESCRIPTION: Informed consent was confirmed the patient underwent general anesthesia without any difficulty position. He was positioned supine. His glutei were taped laterally. His perianal region was prepped and draped in sterile fashion. Surgical tunnel confirmed or plan.  I began with digital and anoscopic examination. Did see the fibrous nodular scar in the posterior midline about 3 cm from the anal verge consistent with the location of the prior recurrent perirectal abscesses. I could feel a ropelike subcutaneous cord continuing up towards the sphincter making it suspicious for a chronic fistula I did place various a fistulous probes into the opening of the scar. I could cannulate into the scar for about a centimeter and then it stopped. I injected with 50-50 methylene blue and peroxide. I could not get it to exit all the way into the rectum.  I decided to proceed with excision of the nodular scar. I placed a 2-0 Prolene figure-of-eight stitch at the exit point of the scar were the chronic recurrent pararectal abscesses were. I used cautery and sharp dissection around the inflamed scarred area. Was 1 x 1 cm spherical. It began to come down on a stalk. The stalk continued to go proximally up in in between the internal and external anal sphincters.   I reexamined into the rectum. I did  use a reverse shepherd's hook fistulous probe and was able to cannulate the tract from the rectal side through the intersphincteric portion of the tract through an anal crypt that was right posterior midline. It seem  consistent with a intersphincteric fistulous tract that had been partially obliterated in the subcutaneous tissues. I went ahead and did further dissection on the fistula tract between the sphincters. I bluntly & sharply freed off the internal and external sphincters off the fistulous tract. I took a 2-0 Vicryl stitch and ligated the fistulous tract in the intersphincteric component transversely. I transected the fistula tract using cautery. I ligated the stump of the fistulous tract in the intersphincteric component with another 2-0 Vicryl stitch sagittal he to have double ligated the fistulous tract.  I opened up the anoderm superficial to the sphincters up to the opening in the anal crypt. I debrided anoderm back to have a clean opening.  I ligated the entrance of the anal crypt of the proximal rectal side of the fistulous tract using a 2-0 Vicryl stitch. I cauterized the edges of the open wound from the anal canal out to the gluteal regions. Hemostasis was excellent. Avoided cautery around the sphincters. The sphincter complex was good and intact. I packed the wound with Nu Gauze.   The patient is being extubated in the recovery room. I have discussed postop care with the patient and his wife in the holding area just prior to surgery. I am about to discuss postoperative findings with the patient's wife.

## 2012-03-08 NOTE — Anesthesia Preprocedure Evaluation (Signed)
Anesthesia Evaluation  Patient identified by MRN, date of birth, ID band Patient awake    Reviewed: Allergy & Precautions, H&P , NPO status , Patient's Chart, lab work & pertinent test results, reviewed documented beta blocker date and time   Airway Mallampati: II TM Distance: >3 FB Neck ROM: Full    Dental  (+) Teeth Intact and Dental Advisory Given   Pulmonary neg pulmonary ROS,  breath sounds clear to auscultation        Cardiovascular negative cardio ROS  Rhythm:Regular Rate:Normal  Denies cardiac symptoms   Neuro/Psych negative neurological ROS  negative psych ROS   GI/Hepatic Neg liver ROS, Rectal fistula   Endo/Other  negative endocrine ROS  Renal/GU negative Renal ROS  negative genitourinary   Musculoskeletal negative musculoskeletal ROS (+)   Abdominal   Peds negative pediatric ROS (+)  Hematology negative hematology ROS (+)   Anesthesia Other Findings   Reproductive/Obstetrics negative OB ROS                           Anesthesia Physical Anesthesia Plan  ASA: I  Anesthesia Plan: General   Post-op Pain Management:    Induction: Intravenous  Airway Management Planned: Oral ETT  Additional Equipment:   Intra-op Plan:   Post-operative Plan: Extubation in OR  Informed Consent: I have reviewed the patients History and Physical, chart, labs and discussed the procedure including the risks, benefits and alternatives for the proposed anesthesia with the patient or authorized representative who has indicated his/her understanding and acceptance.   Dental advisory given  Plan Discussed with: CRNA and Surgeon  Anesthesia Plan Comments:         Anesthesia Quick Evaluation

## 2012-03-08 NOTE — Anesthesia Postprocedure Evaluation (Signed)
  Anesthesia Post-op Note  Patient: Danny [name]  Procedure(s) Performed: Procedure(s) (LRB): EXAM UNDER ANESTHESIA WITH ANAL FISTULECTOMY (N/A)  Patient Location: PACU  Anesthesia Type: General  Level of Consciousness: awake, alert , oriented and patient cooperative  Airway and Oxygen Therapy: Patient Spontanous Breathing  Post-op Pain: none  Post-op Assessment: Post-op Vital signs reviewed, Patient's Cardiovascular Status Stable, Respiratory Function Stable, Patent Airway, No signs of Nausea or vomiting, Adequate PO intake and Pain level controlled  Post-op Vital Signs: Reviewed and stable  Complications: No apparent anesthesia complications

## 2012-03-08 NOTE — Anesthesia Postprocedure Evaluation (Signed)
  Anesthesia Post-op Note  Patient: Danny [name]  Procedure(s) Performed: Procedure(s) (LRB): EXAM UNDER ANESTHESIA WITH ANAL FISTULECTOMY (N/A)  Patient Location: PACU  Anesthesia Type: General  Level of Consciousness: oriented and sedated  Airway and Oxygen Therapy: Patient Spontanous Breathing  Post-op Pain: mild  Post-op Assessment: Post-op Vital signs reviewed, Patient's Cardiovascular Status Stable, Respiratory Function Stable and Patent Airway  Post-op Vital Signs: stable  Complications: No apparent anesthesia complications

## 2012-03-08 NOTE — H&P (Signed)
Danny [name]  08/24/1971 [id number]  Patient Care Team: Kandyce Rud, MD as PCP - General (Family Medicine)  This patient is a 41 y.o.male who presents today for surgical evaluation.   Reason for visit: Recurrent perirectal abscesses, probable fistula.   The patient is a pleasant healthy male. 8 months ago, he noted a painful lump between his anus and tailbone. It came to a head and drained. It has continued to drain. It intermittently swells. He's never had anything like this before. No history of inflammatory bowel disease or Crohn's. No history of fall or trauma. No history of skin infections her MRSA.   Because it will not go away, he went to his primary care physician. They requested surgical evaluation. They were concerned about probable pilonidal disease.  Exam was c/w perirectal abscess w fistula  No new events    Patient Active Problem List  Diagnoses  . Anxiety and depression  . Impaired glucose tolerance  . Perianal fistula, posterior midline    Past Medical History  Diagnosis Date  . Pilonidal cyst   . Anxiety and depression     states controlled with meds  . Impaired glucose tolerance     denies diabetes- states glucose elevated x 1 in past  . Perianal fistula, posterior midline 02/06/2012  . Headache     sinus headaches  . Multiple allergies     seansonal,environmental  . Anxiety     Past Surgical History  Procedure Date  . Vasectomy 2000  . Anterior cruciate ligament repair 2008    right  . Wisdom tooth extraction     right  lower    History   Social History  . Marital Status: Married    Spouse Name: N/A    Number of Children: N/A  . Years of Education: N/A   Occupational History  . Not on file.   Social History Main Topics  . Smoking status: Former Smoker    Quit date: 11/21/2009  . Smokeless tobacco: Not on file  . Alcohol Use: Yes     12 pk week  . Drug Use: No  . Sexually Active: Not on file   Other Topics Concern  . Not on  file   Social History Narrative  . No narrative on file    Family History  Problem Relation Age of Onset  . Cancer Father     prostate  . Heart disease Maternal Grandfather   . Cancer Paternal Grandfather     lung    Current Facility-Administered Medications  Medication Dose Route Frequency Provider Last Rate Last Dose  . cefOXitin (MEFOXIN) 2 g in dextrose 5 % 50 mL IVPB  2 g Intravenous 60 min Pre-Op Ardeth Sportsman, MD         No Known Allergies  BP 134/74  Pulse 86  Temp 97.5 F (36.4 C)  Resp 20  SpO2 97%   Review of Systems  Constitutional: Negative for fever, chills and diaphoresis.  HENT: Negative for nosebleeds, sore throat, facial swelling, mouth sores, trouble swallowing and ear discharge.  Eyes: Negative for photophobia, discharge and visual disturbance.  Respiratory: Negative for choking, chest tightness, shortness of breath and stridor.  Cardiovascular: Negative for chest pain and palpitations.  Patient walks 60 minutes for about 2 miles without difficulty. No exertional chest/neck/shoulder/arm pain.  Gastrointestinal: Negative for nausea, vomiting, abdominal pain, diarrhea, constipation, blood in stool, abdominal distention, anal bleeding and rectal pain.  No personal nor family history of GI/colon cancer,  inflammatory bowel disease, irritable bowel syndrome, allergy such as Celiac Sprue, dietary/dairy problems, colitis, ulcers nor gastritis.  No recent sick contacts/gastroenteritis. No travel outside the country. No changes in diet.  Genitourinary: Negative for dysuria, urgency, difficulty urinating and testicular pain.  Musculoskeletal: Negative for myalgias, back pain, arthralgias and gait problem.  Skin: Negative for color change, pallor, rash and wound.  Neurological: Negative for dizziness, speech difficulty, weakness, numbness and headaches.  Hematological: Negative for adenopathy. Does not bruise/bleed easily.  Psychiatric/Behavioral: Negative for  hallucinations, confusion and agitation.   Objective:   Physical Exam  Constitutional: He is oriented to person, place, and time. He appears well-developed and well-nourished. No distress.  HENT:  Head: Normocephalic.  Mouth/Throat: Oropharynx is clear and moist. No oropharyngeal exudate.  Eyes: EOM and lids are normal. Pupils are equal, round, and reactive to light. Right conjunctiva is injected. Right conjunctiva has no hemorrhage. Left conjunctiva is injected. Left conjunctiva has no hemorrhage. No scleral icterus.  Neck: Normal range of motion. Neck supple. No tracheal deviation present.  Cardiovascular: Normal rate, regular rhythm and intact distal pulses.  Pulmonary/Chest: Effort normal and breath sounds normal. No respiratory distress.  Abdominal: Soft. He exhibits no distension. There is no tenderness. Hernia confirmed negative in the right inguinal area and confirmed negative in the left inguinal area.  Genitourinary: Penis normal. No penile tenderness.  Perianal skin clean with good hygiene. No pruritis. No pilonidal disease. No fissure.   Posterior midline 3cm from anus is 1.5cm firm mass with pinhole - hard cord to the anus. = Probable fistula. No abscess.  Musculoskeletal: Normal range of motion. He exhibits no tenderness.  Lymphadenopathy:  He has no cervical adenopathy.  Right: No inguinal adenopathy present.  Left: No inguinal adenopathy present.  Neurological: He is alert and oriented to person, place, and time. No cranial nerve deficit. He exhibits normal muscle tone. Coordination normal.  Skin: Skin is warm and dry. No rash noted. He is not diaphoretic. No erythema. No pallor.  Psychiatric: He has a normal mood and affect. His behavior is normal. Judgment and thought content normal.  Assessment:   Probable perirectal fistula  Plan:    I think that this is an anal fistula. The rectum does point posteriorly. I do not feel an internal abscess.I think he would benefit from  examination under anesthesia with probable fistulotomy / LIFT procedure:   The anatomy & physiology of the anorectal region was discussed. The pathophysiology of anorectal abscess and fistula. Differential diagnosis was discussed. Natural history progression was discussed. I stressed the importance of a bowel regimen to have daily soft bowel movements to minimize progression of disease.   The patient's symptoms are not adequately controlled. Non-operative treatment has not healed the fistula. Therefore, I recommended examination under anaesthesia to confirm the diagnosis and treat the fistula. I discussed techniques that may be required such as fistulotomy, ligation by LIFT technique, and/or seton placement. Benefits & alternatives discussed. I noted a good likelihood this will help address the problem. Risks such as bleeding, pain, recurrence, reoperation, heart attack, death, and other risks were discussed.   I have re-reviewed the the patient's records, history, medications, and allergies.  I have re-examined the patient.  I again discussed intraoperative plans and goals of post-operative recovery.  The patient agrees to proceed.    Ardeth Sportsman, M.D., F.A.C.S. Gastrointestinal and Minimally Invasive Surgery [facility] [address] [place], [place] [address] [phone] Main / Paging [phone])  [phone] Voice Mail   03/08/2012

## 2012-03-08 NOTE — Transfer of Care (Signed)
Immediate Anesthesia Transfer of Care Note  Patient: Danny [name]  Procedure(s) Performed: Procedure(s) (LRB): EXAM UNDER ANESTHESIA WITH ANAL FISTULECTOMY (N/A)  Patient Location: PACU  Anesthesia Type: General  Level of Consciousness: awake, alert , oriented and patient cooperative    Post-op Assessment: Report given to PACU RN, Post -op Vital signs reviewed and stable and Patient moving all extremities X 4  Post vital signs: stable  Complications: No apparent anesthesia complications

## 2012-03-13 ENCOUNTER — Telehealth (INDEPENDENT_AMBULATORY_CARE_PROVIDER_SITE_OTHER): Payer: Self-pay | Admitting: Surgery

## 2012-03-13 ENCOUNTER — Encounter (INDEPENDENT_AMBULATORY_CARE_PROVIDER_SITE_OTHER): Payer: Self-pay | Admitting: General Surgery

## 2012-03-13 NOTE — Telephone Encounter (Signed)
LETTER SENT

## 2012-03-13 NOTE — Telephone Encounter (Signed)
Pt calling to request MD send letter to his employer, explaining he had surgery on 03/08/12 and would be out of work until released by Careers adviser.  His post op appt is in early May.  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 )Please FAX letter to : Sydnee Levans at [phone].

## 2012-03-15 ENCOUNTER — Telehealth (INDEPENDENT_AMBULATORY_CARE_PROVIDER_SITE_OTHER): Payer: Self-pay | Admitting: General Surgery

## 2012-03-15 NOTE — Telephone Encounter (Signed)
Received request for pain meds.  Hydrocodone 5/325 mg; # 30, 1 po Q4-6H prn pain, NO refill.  Called to pharmacist at [facility] [phone].

## 2012-03-27 ENCOUNTER — Ambulatory Visit (INDEPENDENT_AMBULATORY_CARE_PROVIDER_SITE_OTHER): Payer: 59 | Admitting: Surgery

## 2012-03-27 ENCOUNTER — Encounter (INDEPENDENT_AMBULATORY_CARE_PROVIDER_SITE_OTHER): Payer: Self-pay | Admitting: Surgery

## 2012-03-27 VITALS — BP 156/88 | HR 70 | Temp 97.2°F | Resp 14 | Ht 70.5 in | Wt 223.0 lb

## 2012-03-27 DIAGNOSIS — K603 Anal fistula, unspecified: Secondary | ICD-10-CM

## 2012-03-27 NOTE — Patient Instructions (Signed)
ANORECTAL SURGERY: POST OP INSTRUCTIONS  1. Take your usually prescribed home medications unless otherwise directed. 2. DIET: Follow a light bland diet the first 24 hours after arrival home, such as soup, liquids, crackers, etc.  Be sure to include lots of fluids daily.  Avoid fast food or heavy meals as your are more likely to get nauseated.  Eat a low fat the next few days after surgery.   3. PAIN CONTROL: a. Pain is best controlled by a usual combination of three different methods TOGETHER: i. Ice/Heat ii. Over the counter pain medication iii. Prescription pain medication b. Most patients will experience some swelling and discomfort in the anus/rectal area. and incisions.  Ice packs or heat (30-60 minutes up to 6 times a day) will help. Use ice for the first few days to help decrease swelling and bruising, then switch to heat such as warm towels, sitz baths, warm baths, etc to help relax tight/sore spots and speed recovery.  Some people prefer to use ice alone, heat alone, alternating between ice & heat.  Experiment to what works for you.  Swelling and bruising can take several weeks to resolve.   c. It is helpful to take an over-the-counter pain medication regularly for the first few weeks.  Choose one of the following that works best for you: i. Naproxen (Aleve, etc)  Two 220mg tabs twice a day ii. Ibuprofen (Advil, etc) Three 200mg tabs four times a day (every meal & bedtime) iii. Acetaminophen (Tylenol, etc) 500-650mg four times a day (every meal & bedtime) d. A  prescription for pain medication (such as oxycodone, hydrocodone, etc) should be given to you upon discharge.  Take your pain medication as prescribed.  i. If you are having problems/concerns with the prescription medicine (does not control pain, nausea, vomiting, rash, itching, etc), please call us [phone] to see if we need to switch you to a different pain medicine that will work better for you and/or control your side effect  better. ii. If you need a refill on your pain medication, please contact your pharmacy.  They will contact our office to request authorization. Prescriptions will not be filled after 5 pm or on week-ends. 4. KEEP YOUR BOWELS REGULAR a. The goal is one bowel movement a day b. Avoid getting constipated.  Between the surgery and the pain medications, it is common to experience some constipation.  Increasing fluid intake and taking a fiber supplement (such as Metamucil, Citrucel, FiberCon, MiraLax, etc) 1-2 times a day regularly will usually help prevent this problem from occurring.  A mild laxative (prune juice, Milk of Magnesia, MiraLax, etc) should be taken according to package directions if there are no bowel movements after 48 hours. c. Watch out for diarrhea.  If you have many loose bowel movements, simplify your diet to bland foods & liquids for a few days.  Stop any stool softeners and decrease your fiber supplement.  Switching to mild anti-diarrheal medications (Kayopectate, Pepto Bismol) can help.  If this worsens or does not improve, please call us.  5. Wound Care a. Remove your bandages the day after surgery.  Unless discharge instructions indicate otherwise, leave your bandage dry and in place overnight.  Remove the bandage during your first bowel movement.   b. Allow the wound packing to fall out over the next few days.  You can trim exposed gauze / ribbon as it falls out.  You do not need to repack the wound unless instructed otherwise.  Wear an   absorbent pad or soft cotton gauze in your underwear as needed to catch any drainage and help keep the area  c. Keep the area clean and dry.  Bathe / shower every day.  Keep the area clean by showering / bathing over the incision / wound.   It is okay to soak an open wound to help wash it.  Wet wipes or showers / gentle washing after bowel movements is often less traumatic than regular toilet paper. d. You may have some styrofoam-like soft packing in  the rectum which will come out with the first bowel movement.  e. You will often notice bleeding with bowel movements.  This should slow down by the end of the first week of surgery f. Expect some drainage.  This should slow down, too, by the end of the first week of surgery.  Wear an absorbent pad or soft cotton gauze in your underwear until the drainage stops. 6. ACTIVITIES as tolerated:   a. You may resume regular (light) daily activities beginning the next day--such as daily self-care, walking, climbing stairs--gradually increasing activities as tolerated.  If you can walk 30 minutes without difficulty, it is safe to try more intense activity such as jogging, treadmill, bicycling, low-impact aerobics, swimming, etc. b. Save the most intensive and strenuous activity for last such as sit-ups, heavy lifting, contact sports, etc  Refrain from any heavy lifting or straining until you are off narcotics for pain control.   c. DO NOT PUSH THROUGH PAIN.  Let pain be your guide: If it hurts to do something, don't do it.  Pain is your body warning you to avoid that activity for another week until the pain goes down. d. You may drive when you are no longer taking prescription pain medication, you can comfortably sit for long periods of time, and you can safely maneuver your car and apply brakes. e. You may have sexual intercourse when it is comfortable.  7. FOLLOW UP in our office a. Please call [facility] at [phone] to set up an appointment to see your surgeon in the office for a follow-up appointment approximately 2 weeks after your surgery. b. Make sure that you call for this appointment the day you arrive home to insure a convenient appointment time. 10. IF YOU HAVE DISABILITY OR FAMILY LEAVE FORMS, BRING THEM TO THE OFFICE FOR PROCESSING.  DO NOT GIVE THEM TO YOUR DOCTOR.        WHEN TO CALL US [phone]: 1. Poor pain control 2. Reactions / problems with new medications (rash/itching, nausea,  etc)  3. Fever over 101.5 F (38.5 C) 4. Inability to urinate 5. Nausea and/or vomiting 6. Worsening swelling or bruising 7. Continued bleeding from incision. 8. Increased pain, redness, or drainage from the incision  The clinic staff is available to answer your questions during regular business hours (8:30am-5pm).  Please don't hesitate to call and ask to speak to one of our nurses for clinical concerns.   A surgeon from [facility] is always on call at the hospitals   If you have a medical emergency, go to the nearest emergency room or call 911.    [facility] [address], [address], [place], [place]  [address] ? MAIN: [phone] ? TOLL FREE: [phone] ? FAX [phone] [email or web address]    

## 2012-03-27 NOTE — Progress Notes (Signed)
Subjective:     Patient ID: Danny [name], male   DOB: 17-Jul-1971, 41 y.o.   MRN: [id number]  HPI  Danny [name]  09/27/1971 [id number]  Patient Care Team: Kandyce Rud, MD as PCP - General (Family Medicine)  This patient is a 41 y.o.male who presents today for surgical evaluation.   Procedure: Examination under anesthesia there is ligation of intersphincteric fistulous tract for chronic intersphincteric perirectal fistula 03/08/2012  Patient comes in today feeling better. Was rather sore with the first few bowel movements. Having a bowel movement one to 2 times a day. Eating better. Pain down. Using gauze for the open wound. Gradually improving. No fevers or chills. No major blood nor purulent drainage.  No incontinence to flatus or stool  Patient Active Problem List  Diagnoses  . Anxiety and depression  . Impaired glucose tolerance  . Perianal fistula, posterior midline    Past Medical History  Diagnosis Date  . Pilonidal cyst   . Anxiety and depression     states controlled with meds  . Impaired glucose tolerance     denies diabetes- states glucose elevated x 1 in past  . Perianal fistula, posterior midline 02/06/2012  . Headache     sinus headaches  . Multiple allergies     seansonal,environmental  . Anxiety     Past Surgical History  Procedure Date  . Vasectomy 2000  . Anterior cruciate ligament repair 2008    right  . Wisdom tooth extraction     right  lower  . Anal fistulectomy 03/08/12    EUA    History   Social History  . Marital Status: Married    Spouse Name: N/A    Number of Children: N/A  . Years of Education: N/A   Occupational History  . Not on file.   Social History Main Topics  . Smoking status: Former Smoker    Quit date: 11/21/2009  . Smokeless tobacco: Not on file  . Alcohol Use: Yes     12 pk week  . Drug Use: No  . Sexually Active: Not on file   Other Topics Concern  . Not on file   Social History Narrative  . No narrative  on file    Family History  Problem Relation Age of Onset  . Cancer Father     prostate  . Heart disease Maternal Grandfather   . Cancer Paternal Grandfather     lung    Current Outpatient Prescriptions  Medication Sig Dispense Refill  . ALPRAZolam (XANAX) 0.5 MG tablet Take 0.5 mg by mouth 3 (three) times daily as needed. Anxiety       . buPROPion (WELLBUTRIN XL) 300 MG 24 hr tablet Take 300 mg by mouth daily with breakfast.       . pseudoephedrine-acetaminophen (TYLENOL SINUS) 30-500 MG TABS Take 1 tablet by mouth every 4 (four) hours as needed.         No Known Allergies  BP 156/88  Pulse 70  Temp(Src) 97.2 F (36.2 C) (Temporal)  Resp 14  Ht 5' 10.5" (1.791 m)  Wt 223 lb (101.152 kg)  BMI 31.54 kg/m2     Review of Systems  Constitutional: Negative for fever, chills and diaphoresis.  HENT: Negative for sore throat, trouble swallowing and neck pain.   Eyes: Negative for photophobia and visual disturbance.  Respiratory: Negative for choking and shortness of breath.   Cardiovascular: Negative for chest pain and palpitations.  Gastrointestinal: Negative for nausea, vomiting,  abdominal distention, anal bleeding and rectal pain.  Genitourinary: Negative for dysuria, urgency, difficulty urinating and testicular pain.  Musculoskeletal: Negative for myalgias, arthralgias and gait problem.  Skin: Negative for color change and rash.  Neurological: Negative for dizziness, speech difficulty, weakness and numbness.  Hematological: Negative for adenopathy.  Psychiatric/Behavioral: Negative for hallucinations, confusion and agitation.       Objective:   Physical Exam  Constitutional: He is oriented to person, place, and time. He appears well-developed and well-nourished. No distress.  HENT:  Head: Normocephalic.  Mouth/Throat: Oropharynx is clear and moist. No oropharyngeal exudate.  Eyes: Conjunctivae and EOM are normal. Pupils are equal, round, and reactive to light. No  scleral icterus.  Neck: Normal range of motion. No tracheal deviation present.  Cardiovascular: Normal rate, normal heart sounds and intact distal pulses.   Pulmonary/Chest: Effort normal. No respiratory distress.  Abdominal: Soft. He exhibits no distension. There is no tenderness. Hernia confirmed negative in the right inguinal area and confirmed negative in the left inguinal area.       Incisions clean with normal healing ridges.  No hernias  Genitourinary:       Perianal skin clean with good hygiene.  Open posterior midline wound with granulation  No pruritis.  No external skin tags / hemorrhoids of significance.  No pilonidal disease.  No fissure.  No abscess/fistula.  Normal sphincter tone.    Musculoskeletal: Normal range of motion. He exhibits no tenderness.  Neurological: He is alert and oriented to person, place, and time. No cranial nerve deficit. He exhibits normal muscle tone. Coordination normal.  Skin: Skin is warm and dry. No rash noted. He is not diaphoretic.  Psychiatric: He has a normal mood and affect. His behavior is normal.       Assessment:     2.5 weeks s/p LIFT procedure for intersphincteric perirectal fistula    Plan:     Increase activity as tolerated.  Do not push through pain.  Advanced on diet as tolerated. Bowel regimen to avoid problems.  Return to clinic 2 weeks to follow the wound. The patient expressed understanding and appreciation  OK to RTW next Monday

## 2013-11-27 NOTE — H&P (Signed)
Danny CampbellPeter Whitfield, MD   Danny CodeBrian Gurnie Duris, PA-C [address], [place], [place]  [id number]                             [phone]   ORTHOPAEDIC HISTORY & PHYSICAL  Danny CallJames N [name] MRN:  [id number] DOB/SEX:  03-21-1971/male  CHIEF COMPLAINT:  Painful right shoulder  HISTORY: [name] is a very pleasant 43 year old old white male who is seen back today for followup of his right shoulder.  He was initially seen on 07/29/2013 and was evaluated at that time for a problem with his right shoulder.  The history is that in February he had fallen on some ice and his right arm was taken up onto a stone wall and he is really not sure of the mechanism of injury, but had significant pain and discomfort.  [name], had an injection which was extremely painful for him.  He also had plain films and a MRI scan.  He was told by Danny [name] he did not know any reason why he should have his pain, apparently gave him no return evaluation.  He states the injection did last several months and then started coming back.  He has used Aleve and Tylenol without much help.  His biggest complaints and the fact that his pain is from his shoulder to his elbow especially at nighttime and is having difficulty with sleep.  During the day he is able to tolerate it.  He has no pain in the elbow or cervical spine pain.  I did do a corticosteroid injection back on 07/29/2013 and it helped for about 7 weeks. MRI performed and revealed a partial thickness bursal surface supraspinatus tear observed with an adjacent supraspinatus tendinopathy.  Partial thickness bursal surface tearing and also the anterior portion of the infraspinatus tendon with mild distal tendon delamination.  No extension to the articular surface of this area.  He does have some mild degenerative arthropathy of the [facility] joint.  He does not have any bony abnormalities   PAST MEDICAL HISTORY: Patient Active Problem List   Diagnosis  Date Noted  . Perianal fistula, posterior midline 02/06/2012  . Anxiety and depression   . Impaired glucose tolerance    Past Medical History  Diagnosis Date  . Pilonidal cyst   . Anxiety and depression     states controlled with meds  . Impaired glucose tolerance     denies diabetes- states glucose elevated x 1 in past  . Perianal fistula, posterior midline 02/06/2012  . Headache(784.0)     sinus headaches  . Multiple allergies     seansonal,environmental  . Anxiety    Past Surgical History  Procedure Laterality Date  . Vasectomy  2000  . Anterior cruciate ligament repair  2008    right  . Wisdom tooth extraction      right  lower  . Anal fistulectomy  03/08/12    EUA     MEDICATIONS:   Prescriptions prior to admission  Medication Sig Dispense Refill  . ALPRAZolam (XANAX) 0.5 MG tablet Take 0.5 mg by mouth 3 (three) times daily as needed. Anxiety       . buPROPion (WELLBUTRIN XL) 300 MG 24 hr tablet Take 300 mg by mouth daily with breakfast.       . HYDROcodone-acetaminophen (NORCO/VICODIN) 5-325 MG per tablet Take 1 tablet by mouth every 6 (six) hours as  needed for moderate pain.      . pseudoephedrine-acetaminophen (TYLENOL SINUS) 30-500 MG TABS Take 1 tablet by mouth every 4 (four) hours as needed.        ALLERGIES:  No Known Allergies  REVIEW OF SYSTEMS:  A comprehensive review of systems was negative.   FAMILY HISTORY:   Family History  Problem Relation Age of Onset  . Cancer Father     prostate  . Heart disease Maternal Grandfather   . Cancer Paternal Grandfather     lung    SOCIAL HISTORY:   History  Substance Use Topics  . Smoking status: Former Smoker    Quit date: 11/21/2009  . Smokeless tobacco: Not on file  . Alcohol Use: Yes     Comment: 12 pk week      EXAMINATION: Vital signs in last 24 hours: Temp:  [98.2 F (36.8 C)] 98.2 F (36.8 C) (01/20 1109) Pulse Rate:  [87-101] 101 (01/20 1210) Resp:  [9-20] 9 (01/20 1210) BP:  (117-138)/(74-84) 117/74 mmHg (01/20 1205) SpO2:  [96 %-100 %] 100 % (01/20 1210) Weight:  [104.327 kg (230 lb)] 104.327 kg (230 lb) (01/20 1109)  Head is normocephalic.   Eyes:  Pupils equal, round and reactive to light and accommodation.  Extraocular intact. ENT: Ears, nose, and throat were benign.   Neck: supple, no bruits were noted.   Chest: good expansion.   Lungs: essentially clear.   Cardiac: regular rhythm and rate, normal S1, S2.  No murmurs appreciated. Pulses :  1+ bilateral and symmetric in upper extremities. Abdomen is scaphoid, soft, nontender, no masses palpable, normal bowel sounds                  present. CNS:  He is oriented x3 and cranial nerves II-XII grossly intact. Breast, rectal, and genital exams: not performed and not indicated for an orthopedic evaluation. Musculoskeletal: .  He has decreased range of motion secondary to pain.  He can almost get to full abduction and flexion, but this is extremely painful.  External rotation about 60 degrees.  Internal rotation to about 30 degrees with the arm at 90 degrees of abduction.  He is weak in external rotation and a little bit in abduction also.  Markedly positive empty can test.   Imaging Review MRI reveals a partial thickness bursal surface supraspinatus tear observed with an adjacent supraspinatus tendinopathy.  Partial thickness bursal surface tearing and also the anterior portion of the infraspinatus tendon with mild distal tendon delamination.  No extension to the articular surface of this area.  He does have some mild degenerative arthropathy of the [facility] joint.  He does not have any bony abnormalities  ASSESSMENT: Partial thickness bursal surface supraspinatus tear observed with an adjacent supraspinatus tendinopathy.  Partial thickness bursal surface tearing and also the anterior portion of the infraspinatus tendon with mild distal tendon delamination.  Past Medical History  Diagnosis Date  . Pilonidal cyst   .  Anxiety and depression     states controlled with meds  . Impaired glucose tolerance     denies diabetes- states glucose elevated x 1 in past  . Perianal fistula, posterior midline 02/06/2012  . Headache(784.0)     sinus headaches  . Multiple allergies     seansonal,environmental  . Anxiety     PLAN: RIGHT SHOULDER ARTHROSCOPY WITH SUBACROMIAL DECOMPRESSION, DISTAL CLAVICLE RESECTION, POSSIBLE MINI OPEN ROTATOR CUFF REPAIR Plan for   The procedure,  risks, and benefits of total knee  arthroplasty were presented and reviewed. The risks including but not limited to infection, blood clots, vascular and nerve injury, stiffness,  among others were discussed. The patient acknowledged the explanation, agreed to proceed.   Grabiela Wohlford 12/10/2013, 12:37 PM

## 2013-12-04 ENCOUNTER — Encounter (HOSPITAL_BASED_OUTPATIENT_CLINIC_OR_DEPARTMENT_OTHER): Payer: Self-pay | Admitting: *Deleted

## 2013-12-04 NOTE — Progress Notes (Signed)
No labs needed-no cardiac or resp problems  

## 2013-12-10 ENCOUNTER — Encounter (HOSPITAL_BASED_OUTPATIENT_CLINIC_OR_DEPARTMENT_OTHER): Payer: Self-pay

## 2013-12-10 ENCOUNTER — Encounter (HOSPITAL_BASED_OUTPATIENT_CLINIC_OR_DEPARTMENT_OTHER): Payer: 59 | Admitting: Anesthesiology

## 2013-12-10 ENCOUNTER — Ambulatory Visit (HOSPITAL_BASED_OUTPATIENT_CLINIC_OR_DEPARTMENT_OTHER): Payer: 59 | Admitting: Anesthesiology

## 2013-12-10 ENCOUNTER — Ambulatory Visit (HOSPITAL_BASED_OUTPATIENT_CLINIC_OR_DEPARTMENT_OTHER)
Admission: RE | Admit: 2013-12-10 | Discharge: 2013-12-10 | Disposition: A | Discharge status: Home / Self Care | Payer: 59 | Source: Ambulatory Visit | Attending: Orthopaedic Surgery | Admitting: Orthopaedic Surgery

## 2013-12-10 ENCOUNTER — Encounter (HOSPITAL_BASED_OUTPATIENT_CLINIC_OR_DEPARTMENT_OTHER)
Admission: RE | Disposition: A | Discharge status: Home / Self Care | Payer: Self-pay | Source: Ambulatory Visit | Attending: Orthopaedic Surgery

## 2013-12-10 DIAGNOSIS — M7580 Other shoulder lesions, unspecified shoulder: Principal | ICD-10-CM

## 2013-12-10 DIAGNOSIS — F3289 Other specified depressive episodes: Secondary | ICD-10-CM | POA: Insufficient documentation

## 2013-12-10 DIAGNOSIS — M75110 Incomplete rotator cuff tear or rupture of unspecified shoulder, not specified as traumatic: Secondary | ICD-10-CM | POA: Insufficient documentation

## 2013-12-10 DIAGNOSIS — M7541 Impingement syndrome of right shoulder: Secondary | ICD-10-CM

## 2013-12-10 DIAGNOSIS — F411 Generalized anxiety disorder: Secondary | ICD-10-CM | POA: Insufficient documentation

## 2013-12-10 DIAGNOSIS — M25819 Other specified joint disorders, unspecified shoulder: Secondary | ICD-10-CM | POA: Insufficient documentation

## 2013-12-10 DIAGNOSIS — Z87891 Personal history of nicotine dependence: Secondary | ICD-10-CM | POA: Insufficient documentation

## 2013-12-10 DIAGNOSIS — F329 Major depressive disorder, single episode, unspecified: Secondary | ICD-10-CM | POA: Insufficient documentation

## 2013-12-10 DIAGNOSIS — M19019 Primary osteoarthritis, unspecified shoulder: Secondary | ICD-10-CM | POA: Insufficient documentation

## 2013-12-10 HISTORY — PX: SHOULDER ARTHROSCOPY WITH ROTATOR CUFF REPAIR AND SUBACROMIAL DECOMPRESSION: SHX5686

## 2013-12-10 SURGERY — SHOULDER ARTHROSCOPY WITH ROTATOR CUFF REPAIR AND SUBACROMIAL DECOMPRESSION
Anesthesia: Regional | Site: Shoulder | Laterality: Right

## 2013-12-10 MED ORDER — BUPIVACAINE HCL (PF) 0.25 % IJ SOLN
INTRAMUSCULAR | Status: AC
  Filled 2013-12-10: qty 30

## 2013-12-10 MED ORDER — LACTATED RINGERS IV SOLN
INTRAVENOUS | Status: DC
  Administered 2013-12-10: 11:00:00 via INTRAVENOUS

## 2013-12-10 MED ORDER — LIDOCAINE HCL (CARDIAC) 20 MG/ML IV SOLN
INTRAVENOUS | Status: DC | PRN
  Administered 2013-12-10: 80 mg via INTRAVENOUS

## 2013-12-10 MED ORDER — ARTIFICIAL TEARS OP OINT
TOPICAL_OINTMENT | OPHTHALMIC | Status: DC | PRN
  Administered 2013-12-10: 1 via OPHTHALMIC

## 2013-12-10 MED ORDER — CHLORHEXIDINE GLUCONATE 4 % EX LIQD: 60.0000 mL | Freq: Once | CUTANEOUS | Status: DC

## 2013-12-10 MED ORDER — ONDANSETRON HCL 4 MG/2ML IJ SOLN
INTRAMUSCULAR | Status: DC | PRN
  Administered 2013-12-10: 4 mg via INTRAVENOUS

## 2013-12-10 MED ORDER — FENTANYL CITRATE 0.05 MG/ML IJ SOLN
INTRAMUSCULAR | Status: AC
  Filled 2013-12-10: qty 6

## 2013-12-10 MED ORDER — FENTANYL CITRATE 0.05 MG/ML IJ SOLN
INTRAMUSCULAR | Status: AC
  Filled 2013-12-10: qty 2

## 2013-12-10 MED ORDER — DEXAMETHASONE SODIUM PHOSPHATE 4 MG/ML IJ SOLN
INTRAMUSCULAR | Status: DC | PRN
  Administered 2013-12-10: 10 mg via INTRAVENOUS

## 2013-12-10 MED ORDER — MIDAZOLAM HCL 2 MG/2ML IJ SOLN
INTRAMUSCULAR | Status: AC
  Filled 2013-12-10: qty 2

## 2013-12-10 MED ORDER — OXYCODONE HCL 5 MG/5ML PO SOLN: 5.0000 mg | Freq: Once | ORAL | Status: DC | PRN

## 2013-12-10 MED ORDER — OXYCODONE-ACETAMINOPHEN 5-325 MG PO TABS: 1.0000 | ORAL_TABLET | ORAL | Status: DC | PRN

## 2013-12-10 MED ORDER — OXYCODONE HCL 5 MG PO TABS: 5.0000 mg | ORAL_TABLET | Freq: Once | ORAL | Status: DC | PRN

## 2013-12-10 MED ORDER — MIDAZOLAM HCL 2 MG/2ML IJ SOLN
1.0000 mg | INTRAMUSCULAR | Status: DC | PRN
  Administered 2013-12-10: 1 mg via INTRAVENOUS

## 2013-12-10 MED ORDER — BUPIVACAINE-EPINEPHRINE PF 0.5-1:200000 % IJ SOLN
INTRAMUSCULAR | Status: DC | PRN
  Administered 2013-12-10: 30 mL

## 2013-12-10 MED ORDER — SODIUM CHLORIDE 0.9 % IR SOLN
Status: DC | PRN
  Administered 2013-12-10 (×12): 1000 mL

## 2013-12-10 MED ORDER — PROPOFOL 10 MG/ML IV BOLUS
INTRAVENOUS | Status: DC | PRN
  Administered 2013-12-10: 200 mg via INTRAVENOUS
  Administered 2013-12-10: 30 mg via INTRAVENOUS

## 2013-12-10 MED ORDER — LIDOCAINE HCL 4 % MT SOLN
OROMUCOSAL | Status: DC | PRN
  Administered 2013-12-10: 2 mL via TOPICAL

## 2013-12-10 MED ORDER — SUCCINYLCHOLINE CHLORIDE 20 MG/ML IJ SOLN
INTRAMUSCULAR | Status: DC | PRN
  Administered 2013-12-10: 140 mg via INTRAVENOUS

## 2013-12-10 MED ORDER — ONDANSETRON HCL 4 MG/2ML IJ SOLN: 4.0000 mg | Freq: Four times a day (QID) | INTRAMUSCULAR | Status: DC | PRN

## 2013-12-10 MED ORDER — FENTANYL CITRATE 0.05 MG/ML IJ SOLN
INTRAMUSCULAR | Status: DC | PRN
  Administered 2013-12-10 (×3): 50 ug via INTRAVENOUS

## 2013-12-10 MED ORDER — HYDROMORPHONE HCL PF 1 MG/ML IJ SOLN: 0.2500 mg | INTRAMUSCULAR | Status: DC | PRN

## 2013-12-10 MED ORDER — SODIUM CHLORIDE 0.9 % IV SOLN: INTRAVENOUS | Status: DC

## 2013-12-10 MED ORDER — FENTANYL CITRATE 0.05 MG/ML IJ SOLN
50.0000 ug | INTRAMUSCULAR | Status: DC | PRN
  Administered 2013-12-10: 50 ug via INTRAVENOUS

## 2013-12-10 SURGICAL SUPPLY — 74 items
APL SKNCLS STERI-STRIP NONHPOA (GAUZE/BANDAGES/DRESSINGS)
BAG DECANTER FOR FLEXI CONT (MISCELLANEOUS) IMPLANT
BENZOIN TINCTURE PRP APPL 2/3 (GAUZE/BANDAGES/DRESSINGS) IMPLANT
BLADE 4.2CUDA (BLADE) IMPLANT
BLADE AVERAGE 25X9 (BLADE) IMPLANT
BLADE CUDA 5.5 (BLADE) IMPLANT
BLADE GREAT WHITE 4.2 (BLADE) IMPLANT
BLADE SURG 15 STRL LF DISP TIS (BLADE) IMPLANT
BLADE SURG 15 STRL SS (BLADE) ×2
BUR OVAL 6.0 (BURR) ×2 IMPLANT
CANISTER SUCT 3000ML (MISCELLANEOUS) IMPLANT
CANISTER SUCT LVC 12 LTR MEDI- (MISCELLANEOUS) ×1 IMPLANT
CANNULA 5.75X71 LONG (CANNULA) IMPLANT
CANNULA ACUFLEX KIT 5X76 (CANNULA) ×2 IMPLANT
CANNULA TWIST IN 8.25X7CM (CANNULA) IMPLANT
CLEANER CAUTERY TIP 5X5 PAD (MISCELLANEOUS) IMPLANT
CUTTER MENISCUS  4.2MM (BLADE)
CUTTER MENISCUS 4.2MM (BLADE) IMPLANT
DECANTER SPIKE VIAL GLASS SM (MISCELLANEOUS) IMPLANT
DRAPE SHOULDER BEACH CHAIR (DRAPES) ×2 IMPLANT
DRAPE SURG 17X23 STRL (DRAPES) ×2 IMPLANT
DRAPE U-SHAPE 47X51 STRL (DRAPES) ×2 IMPLANT
DRSG EMULSION OIL 3X3 NADH (GAUZE/BANDAGES/DRESSINGS) ×3 IMPLANT
DURAPREP 26ML APPLICATOR (WOUND CARE) ×2 IMPLANT
ELECT NDL TIP 2.8 STRL (NEEDLE) IMPLANT
ELECT NEEDLE TIP 2.8 STRL (NEEDLE) ×2 IMPLANT
ELECT REM PT RETURN 9FT ADLT (ELECTROSURGICAL) ×2
ELECTRODE REM PT RTRN 9FT ADLT (ELECTROSURGICAL) ×1 IMPLANT
GAUZE SPONGE 4X4 16PLY XRAY LF (GAUZE/BANDAGES/DRESSINGS) IMPLANT
GLOVE BIOGEL PI IND STRL 7.0 (GLOVE) IMPLANT
GLOVE BIOGEL PI IND STRL 8 (GLOVE) ×1 IMPLANT
GLOVE BIOGEL PI INDICATOR 7.0 (GLOVE) ×1
GLOVE BIOGEL PI INDICATOR 8 (GLOVE) ×1
GLOVE ECLIPSE 7.0 STRL STRAW (GLOVE) ×1 IMPLANT
GLOVE ECLIPSE 8.0 STRL XLNG CF (GLOVE) ×4 IMPLANT
GOWN STRL REUS W/ TWL LRG LVL3 (GOWN DISPOSABLE) ×1 IMPLANT
GOWN STRL REUS W/ TWL XL LVL3 (GOWN DISPOSABLE) IMPLANT
GOWN STRL REUS W/TWL LRG LVL3 (GOWN DISPOSABLE) ×2
GOWN STRL REUS W/TWL XL LVL3 (GOWN DISPOSABLE) ×4
NDL 1/2 CIR CATGUT .05X1.09 (NEEDLE) IMPLANT
NDL SCORPION MULTI FIRE (NEEDLE) IMPLANT
NEEDLE 1/2 CIR CATGUT .05X1.09 (NEEDLE) IMPLANT
NEEDLE SCORPION MULTI FIRE (NEEDLE) IMPLANT
NS IRRIG 1000ML POUR BTL (IV SOLUTION) IMPLANT
PACK ARTHROSCOPY DSU (CUSTOM PROCEDURE TRAY) ×2 IMPLANT
PACK BASIN DAY SURGERY FS (CUSTOM PROCEDURE TRAY) ×2 IMPLANT
PAD ABD 8X10 STRL (GAUZE/BANDAGES/DRESSINGS) ×4 IMPLANT
PAD CLEANER CAUTERY TIP 5X5 (MISCELLANEOUS) ×1
PENCIL BUTTON HOLSTER BLD 10FT (ELECTRODE) ×1 IMPLANT
SET ARTHROSCOPY TUBING (MISCELLANEOUS) ×2
SET ARTHROSCOPY TUBING LN (MISCELLANEOUS) ×1 IMPLANT
SLING ARM LRG ADULT FOAM STRAP (SOFTGOODS) IMPLANT
SLING ARM MED ADULT FOAM STRAP (SOFTGOODS) IMPLANT
SPONGE GAUZE 4X4 12PLY (GAUZE/BANDAGES/DRESSINGS) ×2 IMPLANT
SPONGE LAP 4X18 X RAY DECT (DISPOSABLE) ×4 IMPLANT
STAPLER VISISTAT 35W (STAPLE) IMPLANT
STRIP CLOSURE SKIN 1/2X4 (GAUZE/BANDAGES/DRESSINGS) IMPLANT
SUCTION FRAZIER TIP 10 FR DISP (SUCTIONS) IMPLANT
SUT BONE WAX W31G (SUTURE) IMPLANT
SUT ETHILON 3 0 PS 1 (SUTURE) IMPLANT
SUT ETHILON 4 0 PS 2 18 (SUTURE) IMPLANT
SUT MNCRL AB 3-0 PS2 18 (SUTURE) ×1 IMPLANT
SUT PROLENE 3 0 PS 2 (SUTURE) IMPLANT
SUT VIC AB 0 CT1 27 (SUTURE)
SUT VIC AB 0 CT1 27XBRD ANBCTR (SUTURE) IMPLANT
SUT VIC AB 0 SH 27 (SUTURE) ×1 IMPLANT
SUT VIC AB 2-0 PS2 27 (SUTURE) IMPLANT
SUT VIC AB 2-0 SH 27 (SUTURE)
SUT VIC AB 2-0 SH 27XBRD (SUTURE) IMPLANT
SYR BULB 3OZ (MISCELLANEOUS) IMPLANT
TOWEL OR 17X24 6PK STRL BLUE (TOWEL DISPOSABLE) ×2 IMPLANT
WAND STAR VAC 90 (SURGICAL WAND) ×2 IMPLANT
WATER STERILE IRR 1000ML POUR (IV SOLUTION) ×2 IMPLANT
YANKAUER SUCT BULB TIP NO VENT (SUCTIONS) IMPLANT

## 2013-12-10 NOTE — Transfer of Care (Signed)
Immediate Anesthesia Transfer of Care Note  Patient: Danny [name]  Procedure(s) Performed: Procedure(s): SHOULDER ARTHROSCOPY WITH SUBACROMIAL DECOMPRESSION, DISTAL CALVICLE RESECTION, OPEN ROTATOR CUFF REPAIR. (Right)  Patient Location: PACU  Anesthesia Type:GA combined with regional for post-op pain  Level of Consciousness: awake, alert  and oriented  Airway & Oxygen Therapy: Patient Spontanous Breathing and Patient connected to face mask oxygen  Post-op Assessment: Report given to PACU RN, Post -op Vital signs reviewed and stable and Patient moving all extremities  Post vital signs: Reviewed and stable  Complications: No apparent anesthesia complications

## 2013-12-10 NOTE — Progress Notes (Signed)
Assisted Dr. Hodierne with right, ultrasound guided, interscalene  block. Side rails up, monitors on throughout procedure. See vital signs in flow sheet. Tolerated Procedure well. 

## 2013-12-10 NOTE — Anesthesia Procedure Notes (Addendum)
Anesthesia Regional Block:  Interscalene brachial plexus block  Pre-Anesthetic Checklist: ,, timeout performed, Correct Patient, Correct Site, Correct Laterality, Correct Procedure, Correct Position, site marked, Risks and benefits discussed,  Surgical consent,  Pre-op evaluation,  At surgeon's request and post-op pain management  Laterality: Right  Prep: chloraprep       Needles:  Injection technique: Single-shot  Needle Type: Echogenic Stimulator Needle     Needle Length: 5cm 5 cm Needle Gauge: 22 and 22 G    Additional Needles:  Procedures: ultrasound guided (picture in chart) and nerve stimulator Interscalene brachial plexus block  Nerve Stimulator or Paresthesia:  Response: biceps flexion, 0.45 mA,   Additional Responses:   Narrative:  Start time: 12/10/2013 11:52 AM End time: 12/10/2013 12:07 PM Injection made incrementally with aspirations every 5 mL.  Performed by: Personally  Anesthesiologist: Dr Marcie Bal  Additional Notes: Functioning IV was confirmed and monitors were applied.  A 45m 22ga Arrow echogenic stimulator needle was used. Sterile prep and drape,hand hygiene and sterile gloves were used.  Negative aspiration and negative test dose prior to incremental administration of local anesthetic. The patient tolerated the procedure well.  Ultrasound guidance: relevent anatomy identified, needle position confirmed, local anesthetic spread visualized around nerve(s), vascular puncture avoided.  Image printed for medical record.    Procedure Name: Intubation Date/Time: 12/10/2013 1:13 PM Performed by: WGarald BaldingPre-anesthesia Checklist: Patient identified, Emergency Drugs available, Suction available and Patient being monitored Patient Re-evaluated:Patient Re-evaluated prior to inductionOxygen Delivery Method: Circle System Utilized Preoxygenation: Pre-oxygenation with 100% oxygen Intubation Type: IV induction Ventilation: Mask ventilation without  difficulty Laryngoscope Size: Miller and 2 Grade View: Grade I Tube type: Oral Tube size: 8.0 mm Number of attempts: 1 Airway Equipment and Method: stylet,  oral airway and LTA kit utilized Placement Confirmation: ETT inserted through vocal cords under direct vision,  positive ETCO2 and breath sounds checked- equal and bilateral Secured at: 23 cm Tube secured with: Tape Dental Injury: Teeth and Oropharynx as per pre-operative assessment

## 2013-12-10 NOTE — Op Note (Signed)
PATIENT ID:      Danny [name]  MRN:     [id number] DOB/AGE:    26-Jul-1971 / 43 y.o.       OPERATIVE REPORT    DATE OF PROCEDURE:  12/10/2013       PREOPERATIVE DIAGNOSIS:   RIGHT SHOULDER IMPINGEMENT SYNDROME, AC O-A, PARTIAL ROTATOR CUFF TEAR                                                       Estimated body mass index is 33 kg/(m^2) as calculated from the following:   Height as of this encounter: 5\' 10"  (1.778 m).   Weight as of this encounter: 104.327 kg (230 lb).     POSTOPERATIVE DIAGNOSIS:   right shoulder impingement, partial rotator cuff tear, AC Arthitis                                                                     Estimated body mass index is 33 kg/(m^2) as calculated from the following:   Height as of this encounter: 5\' 10"  (1.778 m).   Weight as of this encounter: 104.327 kg (230 lb).     PROCEDURE:  Procedure(s): RIGHT SHOULDER ARTHROSCOPY WITH DEBRIDEMENT OF PARTIAL ROTATOR CUFF TEAR, SAD, OPEN BURSAL SURFACE RCT REPAIR     SURGEON:  Norlene CampbellPeter Whitfield, MD    ASSISTANT:   Jacqualine CodeBrian Petrarca, PA-C   (Present and scrubbed throughout the case, critical for assistance with exposure, retraction, instrumentation, and closure.)          ANESTHESIA: regional and general     DRAINS: none :      TOURNIQUET TIME: * No tourniquets in log *    COMPLICATIONS:  None   CONDITION:  stable  PROCEDURE IN DETAIL: [id number]   WHITFIELD, PETER W 12/10/2013, 2:21 PM

## 2013-12-10 NOTE — Discharge Instructions (Signed)
°  Post Anesthesia Home Care Instructions ° °Activity: °Get plenty of rest for the remainder of the day. A responsible adult should stay with you for 24 hours following the procedure.  °For the next 24 hours, DO NOT: °-Drive a car °-Operate machinery °-Drink alcoholic beverages °-Take any medication unless instructed by your physician °-Make any legal decisions or sign important papers. ° °Meals: °Start with liquid foods such as gelatin or soup. Progress to regular foods as tolerated. Avoid greasy, spicy, heavy foods. If nausea and/or vomiting occur, drink only clear liquids until the nausea and/or vomiting subsides. Call your physician if vomiting continues. ° °Special Instructions/Symptoms: °Your throat may feel dry or sore from the anesthesia or the breathing tube placed in your throat during surgery. If this causes discomfort, gargle with warm salt water. The discomfort should disappear within 24 hours. ° °Regional Anesthesia Blocks ° °1. Numbness or the inability to move the "blocked" extremity may last from 3-48 hours after placement. The length of time depends on the medication injected and your individual response to the medication. If the numbness is not going away after 48 hours, call your surgeon. ° °2. The extremity that is blocked will need to be protected until the numbness is gone and the  Strength has returned. Because you cannot feel it, you will need to take extra care to avoid injury. Because it may be weak, you may have difficulty moving it or using it. You may not know what position it is in without looking at it while the block is in effect. ° °3. For blocks in the legs and feet, returning to weight bearing and walking needs to be done carefully. You will need to wait until the numbness is entirely gone and the strength has returned. You should be able to move your leg and foot normally before you try and bear weight or walk. You will need someone to be with you when you first try to ensure you  do not fall and possibly risk injury. ° °4. Bruising and tenderness at the needle site are common side effects and will resolve in a few days. ° °5. Persistent numbness or new problems with movement should be communicated to the surgeon or the [facility] ([phone])/ [facility] ([phone]). °

## 2013-12-10 NOTE — H&P (Signed)
  The recent History & Physical has been reviewed. I have personally examined the patient today. There is no interval change to the documented History & Physical. The patient would like to proceed with the procedure.  [name], [name] 12/10/2013,  12:52 PM  The recent History & Physical has been reviewed. I have personally examined the patient today. There is no interval change to the documented History & Physical. The patient would like to proceed with the procedure.  [name], [name] 12/10/2013,  12:52 PM

## 2013-12-10 NOTE — Anesthesia Preprocedure Evaluation (Signed)
Anesthesia Evaluation  Patient identified by MRN, date of birth, ID band Patient awake    Reviewed: Allergy & Precautions, H&P , NPO status , Patient's Chart, lab work & pertinent test results  Airway Mallampati: II  Neck ROM: full    Dental   Pulmonary former smoker,          Cardiovascular negative cardio ROS      Neuro/Psych  Headaches, Anxiety    GI/Hepatic   Endo/Other  obese  Renal/GU      Musculoskeletal   Abdominal   Peds  Hematology   Anesthesia Other Findings   Reproductive/Obstetrics                           Anesthesia Physical Anesthesia Plan  ASA: II  Anesthesia Plan: General and Regional   Post-op Pain Management: MAC Combined w/ Regional for Post-op pain   Induction: Intravenous  Airway Management Planned: Oral ETT  Additional Equipment:   Intra-op Plan:   Post-operative Plan: Extubation in OR  Informed Consent: I have reviewed the patients History and Physical, chart, labs and discussed the procedure including the risks, benefits and alternatives for the proposed anesthesia with the patient or authorized representative who has indicated his/her understanding and acceptance.     Plan Discussed with: CRNA, Anesthesiologist and Surgeon  Anesthesia Plan Comments:         Anesthesia Quick Evaluation

## 2013-12-11 LAB — POCT HEMOGLOBIN-HEMACUE: Hemoglobin: 17.2 g/dL — ABNORMAL HIGH (ref 13.0–17.0)

## 2013-12-11 NOTE — Op Note (Signed)
NAME:  Danny [name], Danny [name] NO.:  [id number]  MEDICAL RECORD NO.:  [id number]  LOCATION:                               FACILITY:  [facility]  PHYSICIAN:  Vonna Kotyk. Quinci Gavidia, M.D.DATE OF BIRTH:  August 02, 1971  DATE OF PROCEDURE:  12/10/2013 DATE OF DISCHARGE:  12/10/2013                              OPERATIVE REPORT   PREOPERATIVE DIAGNOSES: 1. Partial rotator cuff tear right shoulder with impingement. 2. Mild osteoarthritis acromioclavicular joint.  POSTOPERATIVE DIAGNOSES: 1. Partial rotator cuff tear right shoulder with impingement. 2. Mild osteoarthritis acromioclavicular joint.  PROCEDURES: 1. Examination of right shoulder under anesthesia. 2. Diagnostic arthroscopy, right shoulder with debridement of a     partial rotator cuff tear involving supraspinatus. 3. Arthroscopic subacromial decompression. 4. Mini-open bursal surface rotator cuff tear repair.  SURGEON:  Vonna Kotyk. Durward Fortes, M.D.  ASSISTANT:  Aaron Edelman D. Petrarca, PA-C  ANESTHESIA:  General supplemental interscalene nerve block.  COMPLICATIONS:  None.  HISTORY:  A 43 year old, physically active gentleman has had pain with his right shoulder off and on for well over 6 months.  He has had at least 2 cortisone injections that have given him some temporary relief of his pain in the subacromial region, only to have recurrence.  She has had an MRI scan revealing partial thickness bursal surface supraspinatus tear as well as partial tearing of the infraspinatus.  There was no evidence of extension to the articular surface.  He had mild AC joint arthritis, biceps, long head was intact, as well as the glenohumeral joint and the labrum.  Because of his poor response to cortisone and persistent pain with overhead discomfort, he is now to have an arthroscopic evaluation.  DESCRIPTION OF PROCEDURE:  Danny [name] was met with his wife in the holding area, identified the right shoulder as the appropriate operative site  and marked it accordingly.  Anesthesia performed interscalene nerve block to the right upper extremity.  The patient was then transported to room #6 and placed under general anesthesia without difficulty, was placed in a semi-sitting position with the shoulder frame.  The right shoulder was then examined under anesthesia without evidence of instability or adhesive capsulitis.  The shoulder was then prepped with DuraPrep from the base of the neck circumferentially below the elbow.  Sterile draping was performed.  Time-out was called prior to the procedure.  Marking pen was used to outline the [facility] joint, the coracoid and the acromion at a point of a fingerbreadth posterior medial to the posterior angle acromion, a small stab wound was made.  The arthroscope was easily placed into the shoulder joint.  Diagnostic arthroscopy revealed an intact labrum, biceps tendon, and subscap tendons were intact.  I did not see any loose bodies.  No evidence of chondromalacia.  There was a fairly fixed, yet localized partial tear of the supraspinatus just lateral to the biceps tendon.  The second portal was established anteriorly with the ribbed cannula.  I debrided the partial cuff tear, probably measured 5 x 5 mm.  I then performed an arthroscopic subacromial decompression.  Arthroscope was placed in subacromial space posteriorly, the cannula in the subacromial space anteriorly, and a third portal was  established in the lateral subacromial space.  There was abundant bursal tissue that I debrided.  There were some areas of inflammatory bursal tissue.  These were resected with a Cuda shaver and the ArthroCare wand.  The [facility] joint capsule appeared to be intact.  The distal clavicle did not appear to be prominent according I left it alone.  I did perform a bony acromioplasty both anteriorly and laterally with a nice flat decompression.  There was obvious impingement.  I could barely insert the rib cannula  without abutting both the acromion as well as the cuff.  I thought there was an area that looked pretty thin on the cuff at bursal surface and that with the intra-articular findings, I reluctant to perform a mini open evaluation of the cuff.  This was performed with about a 2 cm incision via sharp dissection occur in the subcutaneous tissue.  Bleeding was controlled with the Bovie.  A raphe within the deltoid fascia was identified and incised.  A subacromial space was easily entered.  Self-retaining retractor was inserted.  There was an area that seemed to be thin that there was supraspinatus right near the biceps groove, so I just over sewed that with 0 Vicryl.  Otherwise, I did not see any evidence of a full-thickness tear.  By finger palpation, I had a very nice subacromial decompression, so I irrigated the wound. I closed the deltoid interval with a running 0 Vicryl, subcu with 3-0 Monocryl, and skin with Steri-Strips.  Sterile bulky dressing was applied, followed by a sling.  PLAN:  Oxycodone for pain.  Office 1 week.     Vonna Kotyk. Durward Fortes, M.D.     PWW/MEDQ  D:  12/10/2013  T:  12/11/2013  Job:  [id number]

## 2013-12-11 NOTE — Anesthesia Postprocedure Evaluation (Signed)
Anesthesia Post Note  Patient: [name]  Procedure(s) Performed: Procedure(s) (LRB): SHOULDER ARTHROSCOPY WITH SUBACROMIAL DECOMPRESSION, DISTAL CALVICLE RESECTION, OPEN ROTATOR CUFF REPAIR. (Right)  Anesthesia type: General  Patient location: PACU  Post pain: Pain level controlled and Adequate analgesia  Post assessment: Post-op Vital signs reviewed, Patient's Cardiovascular Status Stable, Respiratory Function Stable, Patent Airway and Pain level controlled  Last Vitals:  Filed Vitals:   12/10/13 1536  BP: 147/83  Pulse: 98  Temp: 36.8 C  Resp: 20    Post vital signs: Reviewed and stable  Level of consciousness: awake, alert  and oriented  Complications: No apparent anesthesia complications

## 2013-12-12 ENCOUNTER — Encounter (HOSPITAL_BASED_OUTPATIENT_CLINIC_OR_DEPARTMENT_OTHER): Payer: Self-pay | Admitting: Orthopaedic Surgery

## 2014-03-20 ENCOUNTER — Ambulatory Visit (INDEPENDENT_AMBULATORY_CARE_PROVIDER_SITE_OTHER): Payer: 59 | Admitting: Family Medicine

## 2014-03-20 ENCOUNTER — Encounter: Payer: Self-pay | Admitting: Family Medicine

## 2014-03-20 VITALS — BP 132/78 | HR 76 | Temp 98.4°F | Ht 70.5 in | Wt 230.5 lb

## 2014-03-20 DIAGNOSIS — R41840 Attention and concentration deficit: Secondary | ICD-10-CM

## 2014-03-20 DIAGNOSIS — F419 Anxiety disorder, unspecified: Secondary | ICD-10-CM

## 2014-03-20 DIAGNOSIS — F341 Dysthymic disorder: Secondary | ICD-10-CM

## 2014-03-20 DIAGNOSIS — F32A Depression, unspecified: Secondary | ICD-10-CM

## 2014-03-20 DIAGNOSIS — F329 Major depressive disorder, single episode, unspecified: Secondary | ICD-10-CM

## 2014-03-20 NOTE — Patient Instructions (Signed)
Shirlee LimerickMarion will call about your referral. We'll request your records in the meantime.  Take care.

## 2014-03-20 NOTE — Assessment & Plan Note (Addendum)
Had been on mult meds prev.   [facility]- [facility].   He admits to frequent checking, OCD tendencies.  It affects his concentration.   Had been on xanax and wellbutrin prev.  Xanax was making him hungry and would help him sleep.   Had been on SSRI prev w/o relief.  Never evaluated for ADD.  He admits to being "scatterbrained." Off wellbutrin, rare xanax use.  Unclear if anxiety is causing attention deficits or the other way around.  D/w pt.  No SI/HI.  Reasonable to get outside records, have him f/u with psych for testing for ADD.  We'll go from there.  He agrees.  >30 minutes spent in face to face time with patient, >50% spent in counselling or coordination of care

## 2014-03-20 NOTE — Progress Notes (Signed)
Pre visit review using our clinic review tool, if applicable. No additional management support is needed unless otherwise documented below in the visit note.  H/o R shoulder surgery, ROM much better.  Less pain than prev, but not fully resolved yet (as expected given the timeline).  Compliant with exercises.   H/o MDD.  Had been on mult meds.  [facility].   He admits to frequent checking, OCD tendencies.  It affects his concentration.   Had been on xanax and wellbutrin prev.  Xanax was making him hungry and would help him sleep.   Had been on SSRI prev.   Never evaluated for ADD.   He admits to being "scatterbrained." Off wellbutrin, rare xanax use.   PMH and SH reviewed  ROS: See HPI, otherwise noncontributory.  Meds, vitals, and allergies reviewed.   GEN: nad, alert and oriented HEENT: mucous membranes moist NECK: supple w/o LA CV: rrr PULM: ctab, no inc wob ABD: soft, +bs EXT: no edema SKIN: no acute rash CN 2-12 wnl B, S/S/DTR wnl x4

## 2014-04-02 ENCOUNTER — Ambulatory Visit (INDEPENDENT_AMBULATORY_CARE_PROVIDER_SITE_OTHER): Payer: 59 | Admitting: Psychology

## 2014-04-02 DIAGNOSIS — F988 Other specified behavioral and emotional disorders with onset usually occurring in childhood and adolescence: Secondary | ICD-10-CM

## 2014-04-03 ENCOUNTER — Telehealth: Payer: Self-pay | Admitting: Family Medicine

## 2014-04-03 NOTE — Telephone Encounter (Signed)
[facility]. Wants to know if you have received the results from the test?  Thank you

## 2014-04-04 NOTE — Telephone Encounter (Signed)
Not yet

## 2014-04-04 NOTE — Telephone Encounter (Signed)
Patient advised.

## 2014-04-08 ENCOUNTER — Telehealth: Payer: Self-pay | Admitting: Family Medicine

## 2014-04-08 NOTE — Telephone Encounter (Signed)
Please call pt.  I talked with psychology- pt needs extra testing his initial testing was atypical.  Have him call [phone].   We shouldn't try any intervention with meds until his f/u testing is done and reviewed.

## 2014-04-08 NOTE — Telephone Encounter (Signed)
Left detailed message on voicemail of cell phone.  Spoke with patient at this number earlier today.

## 2014-04-08 NOTE — Telephone Encounter (Signed)
Patient says he can't go through that again.  He says he knew that he didn't do well on that testing but that it made him very uncomfortable and he couldn't do it again.  Patient says the psychologist told him that he would speak with Dr. Para Marchuncan and suggest some medication.  Patient says he can't keep taking time off work and paying copays so if Dr. Para Marchuncan cannot help him, he will have to just keep dealing with it.  Patient says the problem is that he cannot focus well and that's why he needs medication.  Patient was not upset in any way, just straight-forward with what he could do.

## 2014-04-08 NOTE — Telephone Encounter (Signed)
The issue here is that we don't have a firm dx to direct treatment.  We can't really do much safely until the testing is done to shed more light on this.  I'll defer to the patient at this point.  I don't have anything else to offer currently.  He may get a phone call from the psychology clinic in the meantime about testing (that was a result of my conversation with psychology).  He can d/w them.

## 2014-08-26 ENCOUNTER — Ambulatory Visit: Payer: 59 | Admitting: Family Medicine

## 2015-04-16 ENCOUNTER — Other Ambulatory Visit: Payer: Self-pay | Admitting: Orthopaedic Surgery

## 2015-04-16 DIAGNOSIS — M25561 Pain in right knee: Secondary | ICD-10-CM

## 2015-04-30 ENCOUNTER — Other Ambulatory Visit: Payer: Self-pay | Admitting: Orthopaedic Surgery

## 2015-04-30 ENCOUNTER — Ambulatory Visit
Admission: RE | Admit: 2015-04-30 | Discharge: 2015-04-30 | Disposition: A | Discharge status: Home / Self Care | Payer: 59 | Source: Ambulatory Visit | Attending: Orthopaedic Surgery | Admitting: Orthopaedic Surgery

## 2015-04-30 DIAGNOSIS — M25561 Pain in right knee: Secondary | ICD-10-CM

## 2015-04-30 DIAGNOSIS — Z77018 Contact with and (suspected) exposure to other hazardous metals: Secondary | ICD-10-CM

## 2015-07-30 ENCOUNTER — Telehealth: Payer: Self-pay | Admitting: Family Medicine

## 2015-07-30 NOTE — Telephone Encounter (Signed)
No DPR signed to speak with pts mother. Danny [name] has appt scheduled to see Dr Reece Agar on 08/06/15 at 5:45 pm.

## 2015-07-30 NOTE — Telephone Encounter (Signed)
Noted. I assume scheduling issue and that's why he scheduled with me? (Pt of Dr Para March). Mother states I said I would see patient - I don't remember this - but will see him next week.

## 2015-07-30 NOTE — Telephone Encounter (Signed)
Patient Name: Danny [name] DOB: [birth year] Initial Comment Caller states her son has anger issues, may be due to depression and drinking, able to work but affects his family life, also OCD tendencies, may need to be medicated, mother wants call to be confidential so he doesn't find out, Call back [phone] or [phone] Nurse Assessment Nurse: Yetta Barre, RN, Miranda Date/Time (Eastern Time): 07/30/2015 9:36:27 AM Confirm and document reason for call. If symptomatic, describe symptoms. ---Caller states she has already made an appt for her son. She states he does not know that she is calling and he will get upset if he finds out she has called. Has the patient traveled out of the country within the last 30 days? ---Not Applicable Does the patient require triage? ---No Please document clinical information provided and list any resource used. ---Told caller that we are unable to discuss her son with her without his permission. Guidelines Guideline Title Affirmed Question Affirmed Notes Final Disposition User Clinical Call Yetta Barre, RN, [employer]

## 2015-07-31 ENCOUNTER — Ambulatory Visit: Payer: 59 | Admitting: Family Medicine

## 2015-08-06 ENCOUNTER — Ambulatory Visit (INDEPENDENT_AMBULATORY_CARE_PROVIDER_SITE_OTHER): Payer: Commercial Managed Care - HMO | Admitting: Family Medicine

## 2015-08-06 ENCOUNTER — Encounter: Payer: Self-pay | Admitting: Family Medicine

## 2015-08-06 VITALS — BP 120/88 | HR 76 | Temp 98.1°F | Wt 244.0 lb

## 2015-08-06 DIAGNOSIS — F42 Obsessive-compulsive disorder: Secondary | ICD-10-CM | POA: Diagnosis not present

## 2015-08-06 DIAGNOSIS — F39 Unspecified mood [affective] disorder: Secondary | ICD-10-CM

## 2015-08-06 DIAGNOSIS — F429 Obsessive-compulsive disorder, unspecified: Secondary | ICD-10-CM | POA: Insufficient documentation

## 2015-08-06 DIAGNOSIS — J329 Chronic sinusitis, unspecified: Secondary | ICD-10-CM | POA: Diagnosis not present

## 2015-08-06 MED ORDER — CITALOPRAM HYDROBROMIDE 10 MG PO TABS: 10.0000 mg | ORAL_TABLET | Freq: Every day | ORAL | Status: DC

## 2015-08-06 NOTE — Assessment & Plan Note (Signed)
Anticipate main trouble is irritability and trouble controlling anger along with some OCD tendencies. Doubt ADD. Does have fmhx depression/anxiety. Discussed possible bipolar diagnosis. Provided with pt education handout for patient to read to see if sxs resonate with his experience. Will further discuss at f/u in 1 mo. Discussed possibility of sole treatment with antidepressant pushing him into a manic episode - but highly doubt this will happen. Regardless, discussed with patient signs to watch for. Start celexa  daily - discussed common side effects as well as possible increased suicidality. RTC 1 mo f/u visit. Pt and wife agree with plan.  PHQ9 = 15/27, somewhat difficult to function GAD7 = 16/21 MDQ = positive screen (7 positive questions).

## 2015-08-06 NOTE — Progress Notes (Signed)
Pre visit review using our clinic review tool, if applicable. No additional management support is needed unless otherwise documented below in the visit note. 

## 2015-08-06 NOTE — Assessment & Plan Note (Signed)
Has OCD tendencies.

## 2015-08-06 NOTE — Assessment & Plan Note (Signed)
Longstanding issue - recommend flonase daily for 2 wks. Call me with update if no improvement noted, start singulair. If no better, consider prednisone course vs CT sinuses for further evaluation. Discussed possible ENT referral. Pt agrees with plan.

## 2015-08-06 NOTE — Patient Instructions (Addendum)
Restart flonase for possible sinus congestion. Call me in 2 weeks with an update. Return as needed or in 1 month for physical and f/u. Fasting labs prior to physical. Start celexa  once daily for mood. Work on healthy stress relieving strategies - incorporate regular exercise regimen, take time off to read something you like, listen to music, go fishing - whatever works for you to help relieve stress. Nice to meet you today, call us with quesitons. Look at info below about bipolar to see if it resonates with you. We will further discuss next month.  Bipolar Disorder Bipolar disorder is a mental illness. The term bipolar disorder actually is used to describe a group of disorders that all share varying degrees of emotional highs and lows that can interfere with daily functioning, such as work, school, or relationships. Bipolar disorder also can lead to drug abuse, hospitalization, and suicide. The emotional highs of bipolar disorder are periods of elation or irritability and high energy. These highs can range from a mild form (hypomania) to a severe form (mania). People experiencing episodes of hypomania may appear energetic, excitable, and highly productive. People experiencing mania may behave impulsively or erratically. They often make poor decisions. They may have difficulty sleeping. The most severe episodes of mania can involve having very distorted beliefs or perceptions about the world and seeing or hearing things that are not real (psychotic delusions and hallucinations).  The emotional lows of bipolar disorder (depression) also can range from mild to severe. Severe episodes of bipolar depression can involve psychotic delusions and hallucinations. Sometimes people with bipolar disorder experience a state of mixed mood. Symptoms of hypomania or mania and depression are both present during this mixed-mood episode. SIGNS AND SYMPTOMS There are signs and symptoms of the episodes of hypomania and  mania as well as the episodes of depression. The signs and symptoms of hypomania and mania are similar but vary in severity. They include:  Inflated self-esteem or feeling of increased self-confidence.  Decreased need for sleep.  Unusual talkativeness (rapid or pressured speech) or the feeling of a need to keep talking.  Sensation of racing thoughts or constant talking, with quick shifts between topics that may or may not be related (flight of ideas).  Decreased ability to focus or concentrate.  Increased purposeful activity, such as work, studies, or social activity, or nonproductive activity, such as pacing, squirming and fidgeting, or finger and toe tapping.  Impulsive behavior and use of poor judgment, resulting in high-risk activities, such as having unprotected sex or spending excessive amounts of money. Signs and symptoms of depression include the following:   Feelings of sadness, hopelessness, or helplessness.  Frequent or uncontrollable episodes of crying.  Lack of feeling anything or caring about anything.  Difficulty sleeping or sleeping too much.  Inability to enjoy the things you used to enjoy.   Desire to be alone all the time.   Feelings of guilt or worthlessness.  Lack of energy or motivation.   Difficulty concentrating, remembering, or making decisions.  Change in appetite or weight beyond normal fluctuations.  Thoughts of death or the desire to harm yourself. DIAGNOSIS  Bipolar disorder is diagnosed through an assessment by your caregiver. Your caregiver will ask questions about your emotional episodes. There are two main types of bipolar disorder. People with type I bipolar disorder have manic episodes with or without depressive episodes. People with type II bipolar disorder have hypomanic episodes and major depressive episodes, which are more serious than mild depression.  The type of bipolar disorder you have can make an important difference in how  your illness is monitored and treated. Your caregiver may ask questions about your medical history and use of alcohol or drugs, including prescription medication. Certain medical conditions and substances also can cause emotional highs and lows that resemble bipolar disorder (secondary bipolar disorder).  TREATMENT  Bipolar disorder is a long-term illness. It is best controlled with continuous treatment rather than treatment only when symptoms occur. The following treatments can be prescribed for bipolar disorders:  Medication--Medication can be prescribed by a doctor that is an expert in treating mental disorders (psychiatrists). Medications called mood stabilizers are usually prescribed to help control the illness. Other medications are sometimes added if symptoms of mania, depression, or psychotic delusions and hallucinations occur despite the use of a mood stabilizer.  Talk therapy--Some forms of talk therapy are helpful in providing support, education, and guidance. A combination of medication and talk therapy is best for managing the disorder over time. A procedure in which electricity is applied to your brain through your scalp (electroconvulsive therapy) is used in cases of severe mania when medication and talk therapy do not work or work too slowly. Document Released: 02/13/2001 Document Revised: 03/04/2013 Document Reviewed: 12/03/2012 [facility] Patient Information 2015 [place], [place]. This information is not intended to replace advice given to you by your health care provider. Make sure you discuss any questions you have with your health care provider.

## 2015-08-06 NOTE — Progress Notes (Addendum)
BP 120/88 mmHg  Pulse 76  Temp(Src) 98.1 F (36.7 C) (Oral)  Wt 244 lb (110.678 kg)  SpO2 98%   CC: discuss mood  Subjective:    Patient ID: Danny [name], male    DOB: 02/12/1971, 44 y.o.   MRN: [id number]  HPI: KYREL LEIGHTON is a 44 y.o. male presenting on 08/06/2015 for Depression   Last saw Dr Para March 02/2014.   Presents with wife. "I think I'm having depression again"   Staying unhappy, making ppl around him unhappy. Easily angry, irritable. Ongoing issue over last few months. Increased stress at work, increased stress at home (teenagers).   Mild depressed mood - not as bad as previously in his life. - ok sleep.  - no anhedonia. + guilt - "my fault spending money where I shouldn't" + possibly decreased energy level + trouble with concentration - appetite normal + psychomotor agitation - no SI/HI.   Sometimes excessively worries. Some OCD tendencies - towels need to be folded perfectly, checking oven and fridge 3-4 times before bedtime every night. More irritable. Some mood swings. No significant manic symptoms but he does spend money that he doesn't have. No anxiety or panic attacks.   Last saw counselor 15 yrs ago - doesn't like being psychoanalyzed. Does not want to return to see psychology.   No smoking. Chewing tobacco.  Drinks 12 pack a week - 6-8 beer/sitting.  No recreational drugs.   fmhx depression/anxiety and irritability/anger. No fmhx bipolar disorder.  H/o MDD with OCD tendencies affecting concentration. Prior on xanax and wellbutrin (?overactivating) and paxil, effexor. Low dose xanax caused oversedation.   Underwent ADD testing - atypical results overall very low testing throughout. rec rpt testing. Pt refused - "made him uncomfortable".   Grade school performance - average. No concerns while growing up of attention deficit or hyperactivity. Did not enjoy school until middle school. No developmental concerns identified.  Chronic sinus pressure  headaches ongoing. Recently treated for sinusitis with plain amoxicillin course and flonase. Only temporary improvement.   Relevant past medical, surgical, family and social history reviewed and updated as indicated. Interim medical history since our last visit reviewed. Allergies and medications reviewed and updated. No current outpatient prescriptions on file prior to visit.   No current facility-administered medications on file prior to visit.    Review of Systems Per HPI unless specifically indicated above     Objective:    BP 120/88 mmHg  Pulse 76  Temp(Src) 98.1 F (36.7 C) (Oral)  Wt 244 lb (110.678 kg)  SpO2 98%  Wt Readings from Last 3 Encounters:  08/06/15 244 lb (110.678 kg)  03/20/14 230 lb 8 oz (104.554 kg)  12/10/13 230 lb (104.327 kg)    Physical Exam  Constitutional: He appears well-developed and well-nourished. No distress.  HENT:  Head: Normocephalic and atraumatic.  Right Ear: Hearing, tympanic membrane, external ear and ear canal normal.  Left Ear: Hearing, tympanic membrane, external ear and ear canal normal.  Nose: Nose normal. No mucosal edema or rhinorrhea. Right sinus exhibits no maxillary sinus tenderness and no frontal sinus tenderness. Left sinus exhibits no maxillary sinus tenderness and no frontal sinus tenderness.  Mouth/Throat: Uvula is midline, oropharynx is clear and moist and mucous membranes are normal. No oropharyngeal exudate, posterior oropharyngeal edema, posterior oropharyngeal erythema or tonsillar abscesses.  Eyes: Conjunctivae and EOM are normal. Pupils are equal, round, and reactive to light. No scleral icterus.  Neck: Normal range of motion. Neck supple.  Cardiovascular:  Normal rate, regular rhythm, normal heart sounds and intact distal pulses.   No murmur heard. Pulmonary/Chest: Effort normal and breath sounds normal. No respiratory distress. He has no wheezes. He has no rales.  Lymphadenopathy:    He has no cervical adenopathy.    Skin: Skin is warm and dry. No rash noted.  Psychiatric: He has a normal mood and affect. His speech is normal and behavior is normal. Judgment and thought content normal. His mood appears not anxious. He does not exhibit a depressed mood.  Nursing note and vitals reviewed.  Results for orders placed or performed during the hospital encounter of 12/10/13  Hemoglobin-hemacue, POC  Result Value Ref Range   Hemoglobin 17.2 (H) 13.0 - 17.0 g/dL      Assessment & Plan:   Problem List Items Addressed This Visit    Mood disorder - Primary    Anticipate main trouble is irritability and trouble controlling anger along with some OCD tendencies. Doubt ADD. Does have fmhx depression/anxiety. Discussed possible bipolar diagnosis. Provided with pt education handout for patient to read to see if sxs resonate with his experience. Will further discuss at f/u in 1 mo. Discussed possibility of sole treatment with antidepressant pushing him into a manic episode - but highly doubt this will happen. Regardless, discussed with patient signs to watch for. Start celexa 10mg  daily - discussed common side effects as well as possible increased suicidality. RTC 1 mo f/u visit. Pt and wife agree with plan.  PHQ9 = 15/27, somewhat difficult to function GAD7 = 16/21 MDQ = positive screen (7 positive questions).      OCD (obsessive compulsive disorder)    Has OCD tendencies.      Chronic congestion of paranasal sinus    Longstanding issue - recommend flonase daily for 2 wks. Call me with update if no improvement noted, start singulair. If no better, consider prednisone course vs CT sinuses for further evaluation. Discussed possible ENT referral. Pt agrees with plan.          Follow up plan: Return in about 1 month (around 09/05/2015), or as needed, for annual exam, prior fasting for blood work.

## 2015-08-12 ENCOUNTER — Ambulatory Visit: Payer: 59 | Admitting: Family Medicine

## 2015-09-07 ENCOUNTER — Encounter: Payer: Self-pay | Admitting: Family Medicine

## 2015-09-08 ENCOUNTER — Other Ambulatory Visit: Payer: Self-pay | Admitting: Family Medicine

## 2015-09-08 MED ORDER — CITALOPRAM HYDROBROMIDE 10 MG PO TABS: 20.0000 mg | ORAL_TABLET | Freq: Every day | ORAL | Status: DC

## 2015-09-09 MED ORDER — CITALOPRAM HYDROBROMIDE 20 MG PO TABS: 20.0000 mg | ORAL_TABLET | Freq: Every day | ORAL | Status: DC

## 2015-09-09 NOTE — Telephone Encounter (Signed)
fyi Dr Para Marchuncan

## 2015-09-15 ENCOUNTER — Ambulatory Visit: Payer: Commercial Managed Care - HMO | Admitting: Family Medicine

## 2015-09-18 ENCOUNTER — Encounter: Payer: Self-pay | Admitting: Family Medicine

## 2015-09-18 ENCOUNTER — Ambulatory Visit (INDEPENDENT_AMBULATORY_CARE_PROVIDER_SITE_OTHER): Payer: Commercial Managed Care - HMO | Admitting: Family Medicine

## 2015-09-18 VITALS — BP 124/82 | HR 80 | Temp 98.5°F | Wt 240.8 lb

## 2015-09-18 DIAGNOSIS — J329 Chronic sinusitis, unspecified: Secondary | ICD-10-CM

## 2015-09-18 DIAGNOSIS — F39 Unspecified mood [affective] disorder: Secondary | ICD-10-CM | POA: Diagnosis not present

## 2015-09-18 DIAGNOSIS — F429 Obsessive-compulsive disorder, unspecified: Secondary | ICD-10-CM

## 2015-09-18 MED ORDER — CITALOPRAM HYDROBROMIDE 10 MG PO TABS: 10.0000 mg | ORAL_TABLET | Freq: Every day | ORAL | Status: DC

## 2015-09-18 MED ORDER — DIVALPROEX SODIUM 500 MG PO DR TAB: 500.0000 mg | DELAYED_RELEASE_TABLET | Freq: Two times a day (BID) | ORAL | Status: DC

## 2015-09-18 NOTE — Assessment & Plan Note (Addendum)
Again reviewed with patient and family. He and wife state that handout on bipolar provided last visit did resonate with them some. Sxs have deteriorated on higher celexa dose. Recommend decrease to 10mg  daily, add on valproate 500mg  nightly x 4d then increase to BID dosing. Refer to psychiatry for further eval of bipolar II disorder. Pt and family agree with plan.  PQH9 = 15 --> 13 GADy = 16 --> 21 MDQ positive last time, did not repeat today.

## 2015-09-18 NOTE — Patient Instructions (Signed)
Let's decrease celexa to 10mg  daily. Start depakote 500mg  nightly for 4 days then increase to twice daily. We will refer you to psychatrist for an evaluation.  questionairres today.

## 2015-09-18 NOTE — Progress Notes (Signed)
Pre visit review using our clinic review tool, if applicable. No additional management support is needed unless otherwise documented below in the visit note. 

## 2015-09-18 NOTE — Assessment & Plan Note (Signed)
flonase has been helpful.

## 2015-09-18 NOTE — Progress Notes (Signed)
   BP 124/82 mmHg  Pulse 80  Temp(Src) 98.5 F (36.9 C) (Oral)  Wt 240 lb 12 oz (109.203 kg)   CC: f/u visit  Subjective:    Patient ID: Danny [name], male    DOB: [birth year], 44 y.o.   MRN: [id number]  HPI: Danny [name] is a 44 y.o. male presenting on 09/18/2015 for Follow-up   Presents with mother and wife today. See prior note for details. Seen here 6 wks ago with dx mood disorder along with OCD, suspicion for bipolar II, started on celexa 10mg  then increased to 20mg  daily. Initially noted improvement, but with higher dose noticing worsening irritability, anxiety, avoidant behavior. Increased stress at work. Started drinking more due to stress. Prior up to 12 pack/wk, more over the past week. He does not want to resort to alcohol when he feels bad.   Relevant past medical, surgical, family and social history reviewed and updated as indicated. Interim medical history since our last visit reviewed. Allergies and medications reviewed and updated. No current outpatient prescriptions on file prior to visit.   No current facility-administered medications on file prior to visit.    Review of Systems Per HPI unless specifically indicated in ROS section     Objective:    BP 124/82 mmHg  Pulse 80  Temp(Src) 98.5 F (36.9 C) (Oral)  Wt 240 lb 12 oz (109.203 kg)  Wt Readings from Last 3 Encounters:  09/18/15 240 lb 12 oz (109.203 kg)  08/06/15 244 lb (110.678 kg)  03/20/14 230 lb 8 oz (104.554 kg)    Physical Exam  Constitutional: He appears well-developed and well-nourished. No distress.  Psychiatric: He has a normal mood and affect. His speech is normal and behavior is normal. Thought content normal.  Nursing note and vitals reviewed.     Assessment & Plan:   Problem List Items Addressed This Visit    OCD (obsessive compulsive disorder)   Relevant Orders   Ambulatory referral to Psychiatry   Mood disorder (HCC) - Primary    Again reviewed with patient and family. He  and wife state that handout on bipolar provided last visit did resonate with them some. Sxs have deteriorated on higher celexa dose. Recommend decrease to 10mg  daily, add on valproate 500mg  nightly x 4d then increase to BID dosing. Refer to psychiatry for further eval of bipolar II disorder. Pt and family agree with plan.  PQH9 = 15 --> 13 GADy = 16 --> 21 MDQ positive last time, did not repeat today.      Relevant Orders   Ambulatory referral to Psychiatry   Chronic congestion of paranasal sinus    flonase has been helpful.          Follow up plan: Return in about 4 weeks (around 10/16/2015), or as needed, for follow up visit.

## 2015-10-07 ENCOUNTER — Other Ambulatory Visit (INDEPENDENT_AMBULATORY_CARE_PROVIDER_SITE_OTHER): Payer: Commercial Managed Care - HMO

## 2015-10-07 ENCOUNTER — Other Ambulatory Visit: Payer: Self-pay | Admitting: Family Medicine

## 2015-10-07 DIAGNOSIS — Z13220 Encounter for screening for lipoid disorders: Secondary | ICD-10-CM

## 2015-10-07 DIAGNOSIS — F39 Unspecified mood [affective] disorder: Secondary | ICD-10-CM | POA: Diagnosis not present

## 2015-10-07 DIAGNOSIS — R7302 Impaired glucose tolerance (oral): Secondary | ICD-10-CM

## 2015-10-08 LAB — LIPID PANEL
Cholesterol: 192 mg/dL (ref 0–200)
HDL: 34.8 mg/dL — ABNORMAL LOW (ref >39.00)
LDL CALC: 126 mg/dL — AB (ref 0–99)
NONHDL: 157.02
Total CHOL/HDL Ratio: 6
Triglycerides: 157 mg/dL — ABNORMAL HIGH (ref 0.0–149.0)
VLDL: 31.4 mg/dL (ref 0.0–40.0)

## 2015-10-08 LAB — COMPREHENSIVE METABOLIC PANEL
ALT: 27 U/L (ref 0–53)
AST: 20 U/L (ref 0–37)
Albumin: 4.3 g/dL (ref 3.5–5.2)
Alkaline Phosphatase: 57 U/L (ref 39–117)
BUN: 16 mg/dL (ref 6–23)
CHLORIDE: 104 meq/L (ref 96–112)
CO2: 28 meq/L (ref 19–32)
Calcium: 9.2 mg/dL (ref 8.4–10.5)
Creatinine, Ser: 0.9 mg/dL (ref 0.40–1.50)
GFR: 97.3 mL/min (ref >60.00)
GLUCOSE: 73 mg/dL (ref 70–99)
Potassium: 4.5 mEq/L (ref 3.5–5.1)
SODIUM: 141 meq/L (ref 135–145)
Total Bilirubin: 0.4 mg/dL (ref 0.2–1.2)
Total Protein: 6.8 g/dL (ref 6.0–8.3)

## 2015-10-08 LAB — CBC WITH DIFFERENTIAL/PLATELET
BASOS PCT: 0.5 % (ref 0.0–3.0)
Basophils Absolute: 0 10*3/uL (ref 0.0–0.1)
EOS ABS: 0.1 10*3/uL (ref 0.0–0.7)
Eosinophils Relative: 1.4 % (ref 0.0–5.0)
HEMATOCRIT: 47 % (ref 39.0–52.0)
Hemoglobin: 16 g/dL (ref 13.0–17.0)
LYMPHS ABS: 1.9 10*3/uL (ref 0.7–4.0)
LYMPHS PCT: 35.4 % (ref 12.0–46.0)
MCHC: 34 g/dL (ref 30.0–36.0)
MCV: 91.1 fl (ref 78.0–100.0)
Monocytes Absolute: 0.9 10*3/uL (ref 0.1–1.0)
Monocytes Relative: 16.1 % — ABNORMAL HIGH (ref 3.0–12.0)
NEUTROS ABS: 2.5 10*3/uL (ref 1.4–7.7)
Neutrophils Relative %: 46.6 % (ref 43.0–77.0)
PLATELETS: 190 10*3/uL (ref 150.0–400.0)
RBC: 5.16 Mil/uL (ref 4.22–5.81)
RDW: 12.4 % (ref 11.5–15.5)
WBC: 5.3 10*3/uL (ref 4.0–10.5)

## 2015-10-08 NOTE — Addendum Note (Signed)
Addended by: Baldomero LamyHAVERS, Dynasia Kercheval C on: 10/08/2015 08:38 AM   Modules accepted: Orders

## 2015-10-09 LAB — VALPROIC ACID LEVEL: Valproic Acid Lvl: 58.2 ug/mL (ref 50.0–100.0)

## 2015-10-12 ENCOUNTER — Ambulatory Visit (INDEPENDENT_AMBULATORY_CARE_PROVIDER_SITE_OTHER): Payer: Commercial Managed Care - HMO | Admitting: Family Medicine

## 2015-10-12 ENCOUNTER — Encounter: Payer: Self-pay | Admitting: Family Medicine

## 2015-10-12 VITALS — BP 122/82 | HR 72 | Temp 98.0°F | Ht 70.5 in | Wt 242.5 lb

## 2015-10-12 DIAGNOSIS — F3181 Bipolar II disorder: Secondary | ICD-10-CM

## 2015-10-12 DIAGNOSIS — F429 Obsessive-compulsive disorder, unspecified: Secondary | ICD-10-CM

## 2015-10-12 DIAGNOSIS — J329 Chronic sinusitis, unspecified: Secondary | ICD-10-CM

## 2015-10-12 DIAGNOSIS — Z0000 Encounter for general adult medical examination without abnormal findings: Secondary | ICD-10-CM | POA: Diagnosis not present

## 2015-10-12 DIAGNOSIS — E669 Obesity, unspecified: Secondary | ICD-10-CM | POA: Diagnosis not present

## 2015-10-12 MED ORDER — DIVALPROEX SODIUM 500 MG PO DR TAB
500.0000 mg | DELAYED_RELEASE_TABLET | Freq: Two times a day (BID) | ORAL | Status: DC
Start: 2015-10-12 — End: 2016-06-20

## 2015-10-12 MED ORDER — CITALOPRAM HYDROBROMIDE 10 MG PO TABS: 10.0000 mg | ORAL_TABLET | Freq: Every day | ORAL | Status: DC

## 2015-10-12 NOTE — Assessment & Plan Note (Signed)
Continue flonase 

## 2015-10-12 NOTE — Assessment & Plan Note (Signed)
Marked improvement on valproate and lower celexa dose - anticipate he does have bipolar II. Discussed with patient. Ok to continue current regimen. If starts struggling again, will recommend psychiatry referral. RTC 6 mo f/u visit.

## 2015-10-12 NOTE — Assessment & Plan Note (Signed)
Preventative protocols reviewed and updated unless pt declined. Discussed healthy diet and lifestyle.  

## 2015-10-12 NOTE — Assessment & Plan Note (Signed)
OCD tendencies stable.

## 2015-10-12 NOTE — Assessment & Plan Note (Signed)
Continue to encourage healthy diet changes to affect sustainable weight loss. 

## 2015-10-12 NOTE — Progress Notes (Signed)
Pre visit review using our clinic review tool, if applicable. No additional management support is needed unless otherwise documented below in the visit note. 

## 2015-10-12 NOTE — Progress Notes (Signed)
BP 122/82 mmHg  Pulse 72  Temp(Src) 98 F (36.7 C) (Oral)  Ht 5' 10.5" (1.791 m)  Wt 242 lb 8 oz (109.997 kg)  BMI 34.29 kg/m2   CC: CPE  Subjective:    Patient ID: Danny [name], male    DOB: Sep 29, 1971, 44 y.o.   MRN: [id number]  HPI: Danny [name] is a 44 y.o. male presenting on 10/12/2015 for Annual Exam   See prior notes for details about mood disorder management. Last visit concern for bipolar II - referred to psychiatry. Valproate was started as a mood stabilizer, celexa  continued. He has not seen psychiatrist. Declines referral today - feels medicines are working well.   Has not drank any alcohol since last visit.  Preventative: Flu shot can receive at work. Tdap 2010 Seat belt use discussed Sunscreen use discussed. No changing moles on skin.  From [place] Married, 1992 Lives with wife and 2 kids (daughter [name], son [name]) Occ: Works for [employer] (contracting) Activity: stays active at work Diet: lots of milk, some water, some fruits/vegetables  Relevant past medical, surgical, family and social history reviewed and updated as indicated. Interim medical history since our last visit reviewed. Allergies and medications reviewed and updated. No current outpatient prescriptions on file prior to visit.   No current facility-administered medications on file prior to visit.    Review of Systems  Constitutional: Negative for fever, chills, activity change, appetite change, fatigue and unexpected weight change.  HENT: Negative for hearing loss.   Eyes: Negative for visual disturbance.  Respiratory: Negative for cough, chest tightness, shortness of breath and wheezing.   Cardiovascular: Negative for chest pain, palpitations and leg swelling.  Gastrointestinal: Negative for nausea, vomiting, abdominal pain, diarrhea, constipation, blood in stool and abdominal distention.  Genitourinary: Negative for hematuria and difficulty urinating.    Musculoskeletal: Positive for arthralgias. Negative for myalgias and neck pain.  Skin: Negative for rash.  Neurological: Negative for dizziness, seizures, syncope and headaches.  Hematological: Negative for adenopathy. Does not bruise/bleed easily.  Psychiatric/Behavioral: Negative for dysphoric mood. The patient is not nervous/anxious.    Per HPI unless specifically indicated in ROS section     Objective:    BP 122/82 mmHg  Pulse 72  Temp(Src) 98 F (36.7 C) (Oral)  Ht 5' 10.5" (1.791 m)  Wt 242 lb 8 oz (109.997 kg)  BMI 34.29 kg/m2  Wt Readings from Last 3 Encounters:  10/12/15 242 lb 8 oz (109.997 kg)  09/18/15 240 lb 12 oz (109.203 kg)  08/06/15 244 lb (110.678 kg)    Physical Exam  Constitutional: He is oriented to person, place, and time. He appears well-developed and well-nourished. No distress.  HENT:  Head: Normocephalic and atraumatic.  Right Ear: Hearing, tympanic membrane, external ear and ear canal normal.  Left Ear: Hearing, tympanic membrane, external ear and ear canal normal.  Nose: Nose normal.  Mouth/Throat: Uvula is midline, oropharynx is clear and moist and mucous membranes are normal. No oropharyngeal exudate, posterior oropharyngeal edema or posterior oropharyngeal erythema.  Eyes: Conjunctivae and EOM are normal. Pupils are equal, round, and reactive to light. No scleral icterus.  Neck: Normal range of motion. Neck supple. No thyromegaly present.  Cardiovascular: Normal rate, regular rhythm, normal heart sounds and intact distal pulses.   No murmur heard. Pulses:      Radial pulses are 2+ on the right side, and 2+ on the left side.  Pulmonary/Chest: Effort normal and breath sounds normal. No respiratory  distress. He has no wheezes. He has no rales.  Abdominal: Soft. Bowel sounds are normal. He exhibits no distension and no mass. There is no tenderness. There is no rebound and no guarding.  Musculoskeletal: Normal range of motion. He exhibits no edema.   Lymphadenopathy:    He has no cervical adenopathy.  Neurological: He is alert and oriented to person, place, and time.  CN grossly intact, station and gait intact  Skin: Skin is warm and dry. No rash noted.  Psychiatric: He has a normal mood and affect. His behavior is normal. Judgment and thought content normal.  Nursing note and vitals reviewed.  Results for orders placed or performed in visit on 10/07/15  Lipid panel  Result Value Ref Range   Cholesterol 192 0 - 200 mg/dL   Triglycerides 782.9 (H) 0.0 - 149.0 mg/dL   HDL 56.21 (L) >30.86 mg/dL   VLDL 57.8 0.0 - 46.9 mg/dL   LDL Cholesterol 629 (H) 0 - 99 mg/dL   Total CHOL/HDL Ratio 6    NonHDL 157.02   Comprehensive metabolic panel  Result Value Ref Range   Sodium 141 135 - 145 mEq/L   Potassium 4.5 3.5 - 5.1 mEq/L   Chloride 104 96 - 112 mEq/L   CO2 28 19 - 32 mEq/L   Glucose, Bld 73 70 - 99 mg/dL   BUN 16 6 - 23 mg/dL   Creatinine, Ser 5.28 0.40 - 1.50 mg/dL   Total Bilirubin 0.4 0.2 - 1.2 mg/dL   Alkaline Phosphatase 57 39 - 117 U/L   AST 20 0 - 37 U/L   ALT 27 0 - 53 U/L   Total Protein 6.8 6.0 - 8.3 g/dL   Albumin 4.3 3.5 - 5.2 g/dL   Calcium 9.2 8.4 - 41.3 mg/dL   GFR 24.40 >10.27 mL/min  CBC with Differential/Platelet  Result Value Ref Range   WBC 5.3 4.0 - 10.5 K/uL   RBC 5.16 4.22 - 5.81 Mil/uL   Hemoglobin 16.0 13.0 - 17.0 g/dL   HCT 25.3 66.4 - 40.3 %   MCV 91.1 78.0 - 100.0 fl   MCHC 34.0 30.0 - 36.0 g/dL   RDW 47.4 25.9 - 56.3 %   Platelets 190.0 150.0 - 400.0 K/uL   Neutrophils Relative % 46.6 43.0 - 77.0 %   Lymphocytes Relative 35.4 12.0 - 46.0 %   Monocytes Relative 16.1 (H) 3.0 - 12.0 %   Eosinophils Relative 1.4 0.0 - 5.0 %   Basophils Relative 0.5 0.0 - 3.0 %   Neutro Abs 2.5 1.4 - 7.7 K/uL   Lymphs Abs 1.9 0.7 - 4.0 K/uL   Monocytes Absolute 0.9 0.1 - 1.0 K/uL   Eosinophils Absolute 0.1 0.0 - 0.7 K/uL   Basophils Absolute 0.0 0.0 - 0.1 K/uL  Valproic acid level  Result Value Ref Range    Valproic Acid Lvl 58.2 50.0 - 100.0 ug/mL      Assessment & Plan:   Problem List Items Addressed This Visit    OCD (obsessive compulsive disorder)    OCD tendencies stable.      Obesity, Class I, BMI 30-34.9    Continue to encourage healthy diet changes to affect sustainable weight loss.      Health maintenance examination - Primary    Preventative protocols reviewed and updated unless pt declined. Discussed healthy diet and lifestyle.       Chronic congestion of paranasal sinus    Continue flonase.      Bipolar  2 disorder (HCC)    Marked improvement on valproate and lower celexa dose - anticipate he does have bipolar II. Discussed with patient. Ok to continue current regimen. If starts struggling again, will recommend psychiatry referral. RTC 6 mo f/u visit.          Follow up plan: Return in about 6 months (around 04/10/2016), or as needed, for follow up visit.

## 2015-10-12 NOTE — Patient Instructions (Signed)
You are doing well today. Return as needed or in 6 months for follow up.  Health Maintenance, Male A healthy lifestyle and preventative care can promote health and wellness.  Maintain regular health, dental, and eye exams.  Eat a healthy diet. Foods like vegetables, fruits, whole grains, low-fat dairy products, and lean protein foods contain the nutrients you need and are low in calories. Decrease your intake of foods high in solid fats, added sugars, and salt. Get information about a proper diet from your health care Yania Bogie, if necessary.  Regular physical exercise is one of the most important things you can do for your health. Most adults should get at least 150 minutes of moderate-intensity exercise (any activity that increases your heart rate and causes you to sweat) each week. In addition, most adults need muscle-strengthening exercises on 2 or more days a week.   Maintain a healthy weight. The body mass index (BMI) is a screening tool to identify possible weight problems. It provides an estimate of body fat based on height and weight. Your health care Jonica Bickhart can find your BMI and can help you achieve or maintain a healthy weight. For males 20 years and older:  A BMI below 18.5 is considered underweight.  A BMI of 18.5 to 24.9 is normal.  A BMI of 25 to 29.9 is considered overweight.  A BMI of 30 and above is considered obese.  Maintain normal blood lipids and cholesterol by exercising and minimizing your intake of saturated fat. Eat a balanced diet with plenty of fruits and vegetables. Blood tests for lipids and cholesterol should begin at age 44 and be repeated every 5 years. If your lipid or cholesterol levels are high, you are over age 44, or you are at high risk for heart disease, you may need your cholesterol levels checked more frequently.Ongoing high lipid and cholesterol levels should be treated with medicines if diet and exercise are not working.  If you smoke, find out  from your health care Tauna Macfarlane how to quit. If you do not use tobacco, do not start.  Lung cancer screening is recommended for adults aged 55-80 years who are at high risk for developing lung cancer because of a history of smoking. A yearly low-dose CT scan of the lungs is recommended for people who have at least a 30-pack-year history of smoking and are current smokers or have quit within the past 15 years. A pack year of smoking is smoking an average of 1 pack of cigarettes a day for 1 year (for example, a 30-pack-year history of smoking could mean smoking 1 pack a day for 30 years or 2 packs a day for 15 years). Yearly screening should continue until the smoker has stopped smoking for at least 15 years. Yearly screening should be stopped for people who develop a health problem that would prevent them from having lung cancer treatment.  If you choose to drink alcohol, do not have more than 2 drinks per day. One drink is considered to be 12 oz (360 mL) of beer, 5 oz (150 mL) of wine, or 1.5 oz (45 mL) of liquor.  Avoid the use of street drugs. Do not share needles with anyone. Ask for help if you need support or instructions about stopping the use of drugs.  High blood pressure causes heart disease and increases the risk of stroke. High blood pressure is more likely to develop in:  People who have blood pressure in the end of the normal range (100-139/85-89  mm Hg).  People who are overweight or obese.  People who are African American.  If you are 34-44 years of age, have your blood pressure checked every 3-5 years. If you are 31 years of age or older, have your blood pressure checked every year. You should have your blood pressure measured twice--once when you are at a hospital or clinic, and once when you are not at a hospital or clinic. Record the average of the two measurements. To check your blood pressure when you are not at a hospital or clinic, you can use:  An automated blood pressure  machine at a pharmacy.  A home blood pressure monitor.  If you are 45-36 years old, ask your health care Carle Dargan if you should take aspirin to prevent heart disease.  Diabetes screening involves taking a blood sample to check your fasting blood sugar level. This should be done once every 3 years after age 41 if you are at a normal weight and without risk factors for diabetes. Testing should be considered at a younger age or be carried out more frequently if you are overweight and have at least 1 risk factor for diabetes.  Colorectal cancer can be detected and often prevented. Most routine colorectal cancer screening begins at the age of 8 and continues through age 72. However, your health care Talyah Seder may recommend screening at an earlier age if you have risk factors for colon cancer. On a yearly basis, your health care Jlynn Langille may provide home test kits to check for hidden blood in the stool. A small camera at the end of a tube may be used to directly examine the colon (sigmoidoscopy or colonoscopy) to detect the earliest forms of colorectal cancer. Talk to your health care Ebert Forrester about this at age 26 when routine screening begins. A direct exam of the colon should be repeated every 5-10 years through age 71, unless early forms of precancerous polyps or small growths are found.  People who are at an increased risk for hepatitis B should be screened for this virus. You are considered at high risk for hepatitis B if:  You were born in a country where hepatitis B occurs often. Talk with your health care Scott Vanderveer about which countries are considered high risk.  Your parents were born in a high-risk country and you have not received a shot to protect against hepatitis B (hepatitis B vaccine).  You have HIV or AIDS.  You use needles to inject street drugs.  You live with, or have sex with, someone who has hepatitis B.  You are a man who has sex with other men (MSM).  You get hemodialysis  treatment.  You take certain medicines for conditions like cancer, organ transplantation, and autoimmune conditions.  Hepatitis C blood testing is recommended for all people born from [birth year] through 1965 and any individual with known risk factors for hepatitis C.  Healthy men should no longer receive prostate-specific antigen (PSA) blood tests as part of routine cancer screening. Talk to your health care Nahmir Zeidman about prostate cancer screening.  Testicular cancer screening is not recommended for adolescents or adult males who have no symptoms. Screening includes self-exam, a health care Maegen Wigle exam, and other screening tests. Consult with your health care Lexys Milliner about any symptoms you have or any concerns you have about testicular cancer.  Practice safe sex. Use condoms and avoid high-risk sexual practices to reduce the spread of sexually transmitted infections (STIs).  You should be screened for STIs, including gonorrhea  and chlamydia if:  You are sexually active and are younger than 24 years.  You are older than 24 years, and your health care Vlasta Baskin tells you that you are at risk for this type of infection.  Your sexual activity has changed since you were last screened, and you are at an increased risk for chlamydia or gonorrhea. Ask your health care Zuzanna Maroney if you are at risk.  If you are at risk of being infected with HIV, it is recommended that you take a prescription medicine daily to prevent HIV infection. This is called pre-exposure prophylaxis (PrEP). You are considered at risk if:  You are a man who has sex with other men (MSM).  You are a heterosexual man who is sexually active with multiple partners.  You take drugs by injection.  You are sexually active with a partner who has HIV.  Talk with your health care Adetokunbo Mccadden about whether you are at high risk of being infected with HIV. If you choose to begin PrEP, you should first be tested for HIV. You should then be tested  every 3 months for as long as you are taking PrEP.  Use sunscreen. Apply sunscreen liberally and repeatedly throughout the day. You should seek shade when your shadow is shorter than you. Protect yourself by wearing long sleeves, pants, a wide-brimmed hat, and sunglasses year round whenever you are outdoors.  Tell your health care Jaylenn Altier of new moles or changes in moles, especially if there is a change in shape or color. Also, tell your health care Haidan Nhan if a mole is larger than the size of a pencil eraser.  A one-time screening for abdominal aortic aneurysm (AAA) and surgical repair of large AAAs by ultrasound is recommended for men aged 8-75 years who are current or former smokers.  Stay current with your vaccines (immunizations).   This information is not intended to replace advice given to you by your health care Taysha Majewski. Make sure you discuss any questions you have with your health care Halimah Bewick.   Document Released: 05/05/2008 Document Revised: 11/28/2014 Document Reviewed: 04/04/2011 Elsevier Interactive Patient Education [employer].

## 2016-01-05 ENCOUNTER — Ambulatory Visit: Payer: Commercial Managed Care - HMO | Admitting: Family Medicine

## 2016-04-11 ENCOUNTER — Encounter: Payer: Self-pay | Admitting: Family Medicine

## 2016-04-11 ENCOUNTER — Ambulatory Visit (INDEPENDENT_AMBULATORY_CARE_PROVIDER_SITE_OTHER): Payer: Commercial Managed Care - HMO | Admitting: Family Medicine

## 2016-04-11 VITALS — BP 136/86 | HR 78 | Temp 98.3°F | Ht 71.0 in | Wt 251.5 lb

## 2016-04-11 DIAGNOSIS — F3181 Bipolar II disorder: Secondary | ICD-10-CM

## 2016-04-11 DIAGNOSIS — E669 Obesity, unspecified: Secondary | ICD-10-CM

## 2016-04-11 NOTE — Patient Instructions (Addendum)
You are doing well today - continue current medicines. Labs today and we will call you with results. Return in 6 months for physical

## 2016-04-11 NOTE — Assessment & Plan Note (Signed)
Weight gain discussed - possible depakote effect. Discussed healthy lifestyle changes to affect weight loss.

## 2016-04-11 NOTE — Progress Notes (Signed)
BP 136/86 mmHg  Pulse 78  Temp(Src) 98.3 F (36.8 C) (Oral)  Ht 5\' 11"  (1.803 m)  Wt 251 lb 8 oz (114.08 kg)  BMI 35.09 kg/m2   CC: 36mo f/u visit  Subjective:    Patient ID: Danny CallJames N Marineau, male    DOB: 10-Jun-1971, 45 y.o.   MRN: [id number]  HPI: Danny [name] is a 45 y.o. male presenting on 04/11/2016 for Follow-up   See prior notes for details about mood disorder management. Concern for bipolar II - declined psych referral. Valproate was started as a mood stabilizer, celexa 10mg  continued. Sleeping well. Appetite increasing. Denies SI/HI.   Taking celexa 10mg  bid and depakote 500mg  bid.  Overall pretty stable, but occasional irritability noted.  Wife feels he's doing well.  Minimal alcohol use now.   Weight up 9 lbs. Stays active at work and with yardwork at home.   Relevant past medical, surgical, family and social history reviewed and updated as indicated. Interim medical history since our last visit reviewed. Allergies and medications reviewed and updated. Current Outpatient Prescriptions on File Prior to Visit  Medication Sig  . citalopram (CELEXA) 10 MG tablet Take 1 tablet (10 mg total) by mouth daily. (Patient taking differently: Take 10 mg by mouth 2 (two) times daily. )  . divalproex (DEPAKOTE) 500 MG DR tablet Take 1 tablet (500 mg total) by mouth 2 (two) times daily.   No current facility-administered medications on file prior to visit.    Review of Systems Per HPI unless specifically indicated in ROS section     Objective:    BP 136/86 mmHg  Pulse 78  Temp(Src) 98.3 F (36.8 C) (Oral)  Ht 5\' 11"  (1.803 m)  Wt 251 lb 8 oz (114.08 kg)  BMI 35.09 kg/m2  Wt Readings from Last 3 Encounters:  04/11/16 251 lb 8 oz (114.08 kg)  10/12/15 242 lb 8 oz (109.997 kg)  09/18/15 240 lb 12 oz (109.203 kg)    Physical Exam  Constitutional: He appears well-developed and well-nourished. No distress.  HENT:  Mouth/Throat: Oropharynx is clear and moist. No  oropharyngeal exudate.  Cardiovascular: Normal rate, regular rhythm, normal heart sounds and intact distal pulses.   No murmur heard. Pulmonary/Chest: Effort normal and breath sounds normal. No respiratory distress. He has no wheezes. He has no rales.  Musculoskeletal: He exhibits no edema.  Skin: Skin is warm and dry. No rash noted.  Psychiatric: He has a normal mood and affect. His behavior is normal. Judgment and thought content normal.  Nursing note and vitals reviewed.  Results for orders placed or performed in visit on 10/07/15  Lipid panel  Result Value Ref Range   Cholesterol 192 0 - 200 mg/dL   Triglycerides 409.8157.0 (H) 0.0 - 149.0 mg/dL   HDL 11.9134.80 (L) >47.82>39.00 mg/dL   VLDL 95.631.4 0.0 - 21.340.0 mg/dL   LDL Cholesterol 086126 (H) 0 - 99 mg/dL   Total CHOL/HDL Ratio 6    NonHDL 157.02   Comprehensive metabolic panel  Result Value Ref Range   Sodium 141 135 - 145 mEq/L   Potassium 4.5 3.5 - 5.1 mEq/L   Chloride 104 96 - 112 mEq/L   CO2 28 19 - 32 mEq/L   Glucose, Bld 73 70 - 99 mg/dL   BUN 16 6 - 23 mg/dL   Creatinine, Ser 5.780.90 0.40 - 1.50 mg/dL   Total Bilirubin 0.4 0.2 - 1.2 mg/dL   Alkaline Phosphatase 57 39 - 117 U/L  AST 20 0 - 37 U/L   ALT 27 0 - 53 U/L   Total Protein 6.8 6.0 - 8.3 g/dL   Albumin 4.3 3.5 - 5.2 g/dL   Calcium 9.2 8.4 - 16.1 mg/dL   GFR 09.60 >45.40 mL/min  CBC with Differential/Platelet  Result Value Ref Range   WBC 5.3 4.0 - 10.5 K/uL   RBC 5.16 4.22 - 5.81 Mil/uL   Hemoglobin 16.0 13.0 - 17.0 g/dL   HCT 98.1 19.1 - 47.8 %   MCV 91.1 78.0 - 100.0 fl   MCHC 34.0 30.0 - 36.0 g/dL   RDW 29.5 62.1 - 30.8 %   Platelets 190.0 150.0 - 400.0 K/uL   Neutrophils Relative % 46.6 43.0 - 77.0 %   Lymphocytes Relative 35.4 12.0 - 46.0 %   Monocytes Relative 16.1 (H) 3.0 - 12.0 %   Eosinophils Relative 1.4 0.0 - 5.0 %   Basophils Relative 0.5 0.0 - 3.0 %   Neutro Abs 2.5 1.4 - 7.7 K/uL   Lymphs Abs 1.9 0.7 - 4.0 K/uL   Monocytes Absolute 0.9 0.1 - 1.0 K/uL    Eosinophils Absolute 0.1 0.0 - 0.7 K/uL   Basophils Absolute 0.0 0.0 - 0.1 K/uL  Valproic acid level  Result Value Ref Range   Valproic Acid Lvl 58.2 50.0 - 100.0 ug/mL      Assessment & Plan:   Problem List Items Addressed This Visit    Bipolar 2 disorder (HCC) - Primary    Chronic, stable. Declined psych referral. Continue celexa  bid as well as depakote  DR BID. Monitoring labs today.       Relevant Orders   CBC with Differential/Platelet   Hepatic function panel   Valproic acid level   Basic metabolic panel   Obesity, Class II, BMI 35-39.9, no comorbidity (HCC)    Weight gain discussed - possible depakote effect. Discussed healthy lifestyle changes to affect weight loss.          Follow up plan: Return in about 6 months (around 10/12/2016) for annual exam, prior fasting for blood work.  Eustaquio Boyden, MD

## 2016-04-11 NOTE — Progress Notes (Signed)
Pre visit review using our clinic review tool, if applicable. No additional management support is needed unless otherwise documented below in the visit note. 

## 2016-04-11 NOTE — Assessment & Plan Note (Signed)
Chronic, stable. Declined psych referral. Continue celexa 10mg  bid as well as depakote 500mg  DR BID. Monitoring labs today.

## 2016-04-12 LAB — HEPATIC FUNCTION PANEL
ALBUMIN: 4.4 g/dL (ref 3.5–5.2)
ALT: 20 U/L (ref 0–53)
AST: 21 U/L (ref 0–37)
Alkaline Phosphatase: 55 U/L (ref 39–117)
BILIRUBIN TOTAL: 0.3 mg/dL (ref 0.2–1.2)
Bilirubin, Direct: 0 mg/dL (ref 0.0–0.3)
Total Protein: 6.8 g/dL (ref 6.0–8.3)

## 2016-04-12 LAB — BASIC METABOLIC PANEL
BUN: 17 mg/dL (ref 6–23)
CO2: 30 meq/L (ref 19–32)
CREATININE: 1.13 mg/dL (ref 0.40–1.50)
Calcium: 9.7 mg/dL (ref 8.4–10.5)
Chloride: 103 mEq/L (ref 96–112)
GFR: 74.65 mL/min (ref >60.00)
Glucose, Bld: 88 mg/dL (ref 70–99)
Potassium: 4.6 mEq/L (ref 3.5–5.1)
SODIUM: 140 meq/L (ref 135–145)

## 2016-04-12 LAB — CBC WITH DIFFERENTIAL/PLATELET
Basophils Absolute: 0 10*3/uL (ref 0.0–0.1)
Basophils Relative: 0.4 % (ref 0.0–3.0)
EOS PCT: 2.4 % (ref 0.0–5.0)
Eosinophils Absolute: 0.1 10*3/uL (ref 0.0–0.7)
HCT: 45.5 % (ref 39.0–52.0)
Hemoglobin: 15.8 g/dL (ref 13.0–17.0)
Lymphocytes Relative: 36.3 % (ref 12.0–46.0)
Lymphs Abs: 2.2 10*3/uL (ref 0.7–4.0)
MCHC: 34.7 g/dL (ref 30.0–36.0)
MCV: 89.6 fl (ref 78.0–100.0)
MONO ABS: 0.6 10*3/uL (ref 0.1–1.0)
Monocytes Relative: 10.7 % (ref 3.0–12.0)
NEUTROS ABS: 3 10*3/uL (ref 1.4–7.7)
NEUTROS PCT: 50.2 % (ref 43.0–77.0)
Platelets: 236 10*3/uL (ref 150.0–400.0)
RBC: 5.08 Mil/uL (ref 4.22–5.81)
RDW: 13.2 % (ref 11.5–15.5)
WBC: 5.9 10*3/uL (ref 4.0–10.5)

## 2016-04-12 LAB — VALPROIC ACID LEVEL: VALPROIC ACID LVL: 34.5 ug/mL — AB (ref 50.0–100.0)

## 2016-06-20 ENCOUNTER — Other Ambulatory Visit: Payer: Self-pay | Admitting: Family Medicine

## 2016-06-22 ENCOUNTER — Other Ambulatory Visit: Payer: Self-pay

## 2016-06-22 MED ORDER — CITALOPRAM HYDROBROMIDE 10 MG PO TABS: 10.0000 mg | ORAL_TABLET | Freq: Two times a day (BID) | ORAL | 1 refills | Status: DC

## 2016-06-22 NOTE — Telephone Encounter (Signed)
Sent in higher dose to pharmacy.

## 2016-06-22 NOTE — Telephone Encounter (Signed)
Tammy left /vm; pt has been taking citalopram 10 mg bid; pt cannot get refilled at [facility] being too early to refill. Med list has take one daily but 04/11/16 office visit noted pt is taking citalopram 10 mg bid. Is it OK to change instructions on med list to bid. Tammy request cb because pt is out of med and is going out of town. Please advise.

## 2016-06-23 ENCOUNTER — Other Ambulatory Visit: Payer: Self-pay | Admitting: *Deleted

## 2016-07-14 ENCOUNTER — Ambulatory Visit: Payer: Commercial Managed Care - HMO | Admitting: Family Medicine

## 2016-07-15 ENCOUNTER — Ambulatory Visit (INDEPENDENT_AMBULATORY_CARE_PROVIDER_SITE_OTHER): Payer: Commercial Managed Care - HMO | Admitting: Family Medicine

## 2016-07-15 ENCOUNTER — Encounter: Payer: Self-pay | Admitting: Family Medicine

## 2016-07-15 VITALS — BP 126/82 | HR 84 | Temp 98.4°F | Wt 249.5 lb

## 2016-07-15 DIAGNOSIS — F1011 Alcohol abuse, in remission: Secondary | ICD-10-CM | POA: Insufficient documentation

## 2016-07-15 DIAGNOSIS — F1020 Alcohol dependence, uncomplicated: Secondary | ICD-10-CM | POA: Insufficient documentation

## 2016-07-15 DIAGNOSIS — Z789 Other specified health status: Secondary | ICD-10-CM | POA: Diagnosis not present

## 2016-07-15 DIAGNOSIS — F3181 Bipolar II disorder: Secondary | ICD-10-CM

## 2016-07-15 DIAGNOSIS — Z7289 Other problems related to lifestyle: Secondary | ICD-10-CM

## 2016-07-15 DIAGNOSIS — F1091 Alcohol use, unspecified, in remission: Secondary | ICD-10-CM | POA: Insufficient documentation

## 2016-07-15 DIAGNOSIS — F1010 Alcohol abuse, uncomplicated: Secondary | ICD-10-CM | POA: Insufficient documentation

## 2016-07-15 MED ORDER — DIVALPROEX SODIUM 500 MG PO DR TAB: DELAYED_RELEASE_TABLET | ORAL | 1 refills | Status: DC

## 2016-07-15 MED ORDER — HYDROXYZINE HCL 25 MG PO TABS: 12.5000 mg | ORAL_TABLET | Freq: Two times a day (BID) | ORAL | 0 refills | Status: DC | PRN

## 2016-07-15 NOTE — Assessment & Plan Note (Signed)
Persistent irritability and some stress in relationship with wife. Discussed I think it's a good idea to consider couple's counseling, # provided. Will place referral as well.  Pt asks for PRN med for anxiety - will prescribe hydroxyzine. Discussed sedation precautions.  Will increase depakote to 500/1000mg  given last blood level was low. Will continue celexa 10mg  bid.  Pt has f/u in 3 mo for CPE, to f/u sooner if needed. Pt agrees with plan.

## 2016-07-15 NOTE — Progress Notes (Signed)
Pre visit review using our clinic review tool, if applicable. No additional management support is needed unless otherwise documented below in the visit note. 

## 2016-07-15 NOTE — Patient Instructions (Addendum)
Try hydroxyzine as needed for anxiety/stress.  Continue celexa 10mg  twice daily Increase depakote to 500mg  in morning and 1000mg  in evening.  Let me know how this does.  We will check depakote levels next visit. Look into scheduling couples counseling with Salomon Fickerri Bauert or Dr Evalina FieldJane Perrin.

## 2016-07-15 NOTE — Progress Notes (Signed)
BP 126/82   Pulse 84   Temp 98.4 F (36.9 C) (Oral)   Wt 249 lb 8 oz (113.2 kg)   BMI 34.80 kg/m    CC: discuss mood Subjective:    Patient ID: Danny [name], male    DOB: 26-Aug-1971, 45 y.o.   MRN: [id number]  HPI: Danny CallJames N Denson is a 45 y.o. male presenting on 07/15/2016 for Alcohol Problem   Here with mother today.  See prior notes for details about mood disorder management. Concern for bipolar II - declined psych referral. Valproate was started as a mood stabilizer, celexa 10mg  continued. He feels depakote has helped him stay level but notes increased sedation. Remaining easily irritable. Some trouble with relationship with wife - states she's very hyper. Interested in couple's counseling.  He has started drinking intermittently - occasional 12 pack in 1 sitting, then goes several months without drinking.   Sleeps well. Denies SI/HI.   Relevant past medical, surgical, family and social history reviewed and updated as indicated. Interim medical history since our last visit reviewed. Allergies and medications reviewed and updated. Current Outpatient Prescriptions on File Prior to Visit  Medication Sig  . acetaminophen (TYLENOL) 500 MG tablet Take 500 mg by mouth every 6 (six) hours as needed.  . citalopram (CELEXA) 10 MG tablet Take 1 tablet (10 mg total) by mouth 2 (two) times daily.   No current facility-administered medications on file prior to visit.     Review of Systems Per HPI unless specifically indicated in ROS section     Objective:    BP 126/82   Pulse 84   Temp 98.4 F (36.9 C) (Oral)   Wt 249 lb 8 oz (113.2 kg)   BMI 34.80 kg/m   Wt Readings from Last 3 Encounters:  07/15/16 249 lb 8 oz (113.2 kg)  04/11/16 251 lb 8 oz (114.1 kg)  10/12/15 242 lb 8 oz (110 kg)    Physical Exam  Constitutional: He appears well-developed and well-nourished. No distress.  Psychiatric: He has a normal mood and affect. His behavior is normal. Thought content  normal.  Nursing note and vitals reviewed.  Results for orders placed or performed in visit on 04/11/16  CBC with Differential/Platelet  Result Value Ref Range   WBC 5.9 4.0 - 10.5 K/uL   RBC 5.08 4.22 - 5.81 Mil/uL   Hemoglobin 15.8 13.0 - 17.0 g/dL   HCT 40.945.5 81.139.0 - 91.452.0 %   MCV 89.6 78.0 - 100.0 fl   MCHC 34.7 30.0 - 36.0 g/dL   RDW 78.213.2 95.611.5 - 21.315.5 %   Platelets 236.0 150.0 - 400.0 K/uL   Neutrophils Relative % 50.2 43.0 - 77.0 %   Lymphocytes Relative 36.3 12.0 - 46.0 %   Monocytes Relative 10.7 3.0 - 12.0 %   Eosinophils Relative 2.4 0.0 - 5.0 %   Basophils Relative 0.4 0.0 - 3.0 %   Neutro Abs 3.0 1.4 - 7.7 K/uL   Lymphs Abs 2.2 0.7 - 4.0 K/uL   Monocytes Absolute 0.6 0.1 - 1.0 K/uL   Eosinophils Absolute 0.1 0.0 - 0.7 K/uL   Basophils Absolute 0.0 0.0 - 0.1 K/uL  Hepatic function panel  Result Value Ref Range   Total Bilirubin 0.3 0.2 - 1.2 mg/dL   Bilirubin, Direct 0.0 0.0 - 0.3 mg/dL   Alkaline Phosphatase 55 39 - 117 U/L   AST 21 0 - 37 U/L   ALT 20 0 - 53 U/L   Total Protein  6.8 6.0 - 8.3 g/dL   Albumin 4.4 3.5 - 5.2 g/dL  Valproic acid level  Result Value Ref Range   Valproic Acid Lvl 34.5 (L) 50.0 - 100.0 ug/mL  Basic metabolic panel  Result Value Ref Range   Sodium 140 135 - 145 mEq/L   Potassium 4.6 3.5 - 5.1 mEq/L   Chloride 103 96 - 112 mEq/L   CO2 30 19 - 32 mEq/L   Glucose, Bld 88 70 - 99 mg/dL   BUN 17 6 - 23 mg/dL   Creatinine, Ser 1.61 0.40 - 1.50 mg/dL   Calcium 9.7 8.4 - 09.6 mg/dL   GFR 04.54 >09.81 mL/min      Assessment & Plan:   Problem List Items Addressed This Visit    Alcohol use (HCC)    Encouraged minimizing alcohol use. Discussed possible naltrexone 50mg  daily if increased alcohol consumption noted.       Relevant Orders   Ambulatory referral to Psychology   Bipolar 2 disorder (HCC) - Primary    Persistent irritability and some stress in relationship with wife. Discussed I think it's a good idea to consider couple's  counseling, # provided. Will place referral as well.  Pt asks for PRN med for anxiety - will prescribe hydroxyzine. Discussed sedation precautions.  Will increase depakote to 500/1000mg  given last blood level was low. Will continue celexa 10mg  bid.  Pt has f/u in 3 mo for CPE, to f/u sooner if needed. Pt agrees with plan.      Relevant Orders   Ambulatory referral to Psychology    Other Visit Diagnoses   None.      Follow up plan: No Follow-up on file.  Eustaquio Boyden, MD

## 2016-07-15 NOTE — Assessment & Plan Note (Signed)
Encouraged minimizing alcohol use. Discussed possible naltrexone 50mg  daily if increased alcohol consumption noted.

## 2016-08-03 ENCOUNTER — Telehealth: Payer: Self-pay | Admitting: Family Medicine

## 2016-08-03 NOTE — Telephone Encounter (Signed)
Pt mother called me back from 8/31 about pt psychology referral endered 8/25. [facility] [place] [facility]

## 2016-08-24 ENCOUNTER — Telehealth: Payer: Self-pay | Admitting: Family Medicine

## 2016-08-24 NOTE — Telephone Encounter (Signed)
[facility] staff [facility].       FYI: We called your patient and he has declined to schedule an appointment with us, at this time. York SpanielSaid they are doing well now.   We appreciate the referral.

## 2016-10-08 ENCOUNTER — Other Ambulatory Visit: Payer: Self-pay | Admitting: Family Medicine

## 2016-10-08 DIAGNOSIS — E785 Hyperlipidemia, unspecified: Secondary | ICD-10-CM

## 2016-10-08 DIAGNOSIS — F3181 Bipolar II disorder: Secondary | ICD-10-CM

## 2016-10-08 DIAGNOSIS — E781 Pure hyperglyceridemia: Secondary | ICD-10-CM | POA: Insufficient documentation

## 2016-10-11 ENCOUNTER — Other Ambulatory Visit (INDEPENDENT_AMBULATORY_CARE_PROVIDER_SITE_OTHER): Payer: Commercial Managed Care - HMO

## 2016-10-11 DIAGNOSIS — E785 Hyperlipidemia, unspecified: Secondary | ICD-10-CM | POA: Diagnosis not present

## 2016-10-11 DIAGNOSIS — R7989 Other specified abnormal findings of blood chemistry: Secondary | ICD-10-CM | POA: Diagnosis not present

## 2016-10-11 DIAGNOSIS — F3181 Bipolar II disorder: Secondary | ICD-10-CM | POA: Diagnosis not present

## 2016-10-11 LAB — COMPREHENSIVE METABOLIC PANEL
ALBUMIN: 4 g/dL (ref 3.5–5.2)
ALK PHOS: 53 U/L (ref 39–117)
ALT: 27 U/L (ref 0–53)
AST: 23 U/L (ref 0–37)
BUN: 16 mg/dL (ref 6–23)
CALCIUM: 9 mg/dL (ref 8.4–10.5)
CHLORIDE: 102 meq/L (ref 96–112)
CO2: 30 mEq/L (ref 19–32)
Creatinine, Ser: 0.77 mg/dL (ref 0.40–1.50)
GFR: 115.96 mL/min (ref >60.00)
Glucose, Bld: 98 mg/dL (ref 70–99)
POTASSIUM: 4.5 meq/L (ref 3.5–5.1)
SODIUM: 137 meq/L (ref 135–145)
TOTAL PROTEIN: 6.5 g/dL (ref 6.0–8.3)
Total Bilirubin: 0.5 mg/dL (ref 0.2–1.2)

## 2016-10-11 LAB — CBC WITH DIFFERENTIAL/PLATELET
BASOS PCT: 0.9 % (ref 0.0–3.0)
Basophils Absolute: 0 10*3/uL (ref 0.0–0.1)
EOS PCT: 3.2 % (ref 0.0–5.0)
Eosinophils Absolute: 0.1 10*3/uL (ref 0.0–0.7)
HEMATOCRIT: 45.1 % (ref 39.0–52.0)
HEMOGLOBIN: 15.8 g/dL (ref 13.0–17.0)
LYMPHS PCT: 34.6 % (ref 12.0–46.0)
Lymphs Abs: 1.4 10*3/uL (ref 0.7–4.0)
MCHC: 34.9 g/dL (ref 30.0–36.0)
MCV: 90.5 fl (ref 78.0–100.0)
MONO ABS: 0.6 10*3/uL (ref 0.1–1.0)
MONOS PCT: 14.6 % — AB (ref 3.0–12.0)
Neutro Abs: 1.9 10*3/uL (ref 1.4–7.7)
Neutrophils Relative %: 46.7 % (ref 43.0–77.0)
Platelets: 182 10*3/uL (ref 150.0–400.0)
RBC: 4.98 Mil/uL (ref 4.22–5.81)
RDW: 12.6 % (ref 11.5–15.5)
WBC: 4.1 10*3/uL (ref 4.0–10.5)

## 2016-10-11 LAB — LIPID PANEL
CHOLESTEROL: 209 mg/dL — AB (ref 0–200)
HDL: 34.8 mg/dL — ABNORMAL LOW (ref >39.00)
Total CHOL/HDL Ratio: 6

## 2016-10-11 LAB — LDL CHOLESTEROL, DIRECT: Direct LDL: 111 mg/dL

## 2016-10-12 LAB — VALPROIC ACID LEVEL: VALPROIC ACID LVL: 52 ug/mL (ref 50.0–100.0)

## 2016-10-18 ENCOUNTER — Encounter: Payer: Self-pay | Admitting: Family Medicine

## 2016-10-18 ENCOUNTER — Ambulatory Visit (INDEPENDENT_AMBULATORY_CARE_PROVIDER_SITE_OTHER): Payer: Commercial Managed Care - HMO | Admitting: Family Medicine

## 2016-10-18 VITALS — BP 136/86 | HR 82 | Temp 98.5°F | Resp 18 | Ht 70.0 in | Wt 255.0 lb

## 2016-10-18 DIAGNOSIS — Z0000 Encounter for general adult medical examination without abnormal findings: Secondary | ICD-10-CM

## 2016-10-18 DIAGNOSIS — F1010 Alcohol abuse, uncomplicated: Secondary | ICD-10-CM | POA: Diagnosis not present

## 2016-10-18 DIAGNOSIS — F3181 Bipolar II disorder: Secondary | ICD-10-CM | POA: Diagnosis not present

## 2016-10-18 DIAGNOSIS — E669 Obesity, unspecified: Secondary | ICD-10-CM | POA: Diagnosis not present

## 2016-10-18 DIAGNOSIS — E66812 Obesity, class 2: Secondary | ICD-10-CM

## 2016-10-18 DIAGNOSIS — E781 Pure hyperglyceridemia: Secondary | ICD-10-CM

## 2016-10-18 MED ORDER — NALTREXONE HCL 50 MG PO TABS: 50.0000 mg | ORAL_TABLET | Freq: Every day | ORAL | 3 refills | Status: DC

## 2016-10-18 NOTE — Assessment & Plan Note (Addendum)
Ongoing alcohol use despite negative consequences and marital discord. Encouraged establishing up with AA. Encouraged couples counseling - agrees. Declines substance abuse counseling.  Pt desired benzo prescription for anxiety - recommended against any controlled substance in setting of ongoing alcohol use.  Denies h/o DT or any alcohol withdrawal symptom.  Pt agrees to trial naltrexone.  Pt left upset.

## 2016-10-18 NOTE — Assessment & Plan Note (Signed)
Encouraged healthy diet and lifestyle changes to affect sustainable weight loss.  

## 2016-10-18 NOTE — Assessment & Plan Note (Signed)
Trig 400s. Anticipate alcohol related as last visit when drinking less, trig 157. Encouraged decreased alcohol consumption.

## 2016-10-18 NOTE — Assessment & Plan Note (Signed)
Preventative protocols reviewed and updated unless pt declined. Discussed healthy diet and lifestyle.  

## 2016-10-18 NOTE — Progress Notes (Signed)
BP 136/86 (BP Location: Left Arm, Patient Position: Sitting, Cuff Size: Large)   Pulse 82   Temp 98.5 F (36.9 C) (Oral)   Resp 18   Ht 5\' 10"  (1.778 m)   Wt 255 lb (115.7 kg)   SpO2 97%   BMI 36.59 kg/m    CC: CPE Subjective:    Patient ID: Danny [name], male    DOB: 08/09/1971, 45 y.o.   MRN: [id number]  HPI: Danny [name] is a 45 y.o. male presenting on 10/18/2016 for Annual Exam   Bipolar II - declined psych referral. On depakote 500/1000 as a mood stabilizer, also on celexa 10mg  bid and hydroxyzine prn anxiety - didn't help. He feels depakote has helped him stay level. Remains irritable. Initially interested in couples counseling, then declined. Prior on xanax with good effect.   Ongoing alcohol abuse - recent alcohol binge. Marital discord due to this. Agrees to couple counseling.   Preventative: Prostate cancer screening - discussed, declines at this time, but agrees to start next year. fmhx prostate cancer.  Flu shot can receive at work.  Tdap 2010.  Seat belt use discussed.  Sunscreen use discussed. No changing moles on skin. Ex smoker. Quit 2011 Alcohol abuse - ongoing binging   [place], [place] Lives with wife and 2 kids (daughter Wyn ForsterMadison, son Genelle BalBrett) Occ: Works for [employer] (contracting) Activity: stays active at work Diet: lots of milk, some water, some fruits/vegetables  Relevant past medical, surgical, family and social history reviewed and updated as indicated. Interim medical history since our last visit reviewed. Allergies and medications reviewed and updated. Current Outpatient Prescriptions on File Prior to Visit  Medication Sig  . acetaminophen (TYLENOL) 500 MG tablet Take 500 mg by mouth every 6 (six) hours as needed.  . citalopram (CELEXA) 10 MG tablet Take 1 tablet (10 mg total) by mouth 2 (two) times daily.  . divalproex (DEPAKOTE) 500 MG DR tablet Take one in am and two in pm   No current facility-administered  medications on file prior to visit.     Review of Systems  Constitutional: Negative for activity change, appetite change, chills, fatigue, fever and unexpected weight change.  HENT: Negative for hearing loss.   Eyes: Negative for visual disturbance.  Respiratory: Negative for cough, chest tightness, shortness of breath and wheezing.   Cardiovascular: Negative for chest pain, palpitations and leg swelling.  Gastrointestinal: Negative for abdominal distention, abdominal pain, blood in stool, constipation, diarrhea, nausea and vomiting.  Genitourinary: Negative for difficulty urinating and hematuria.  Musculoskeletal: Negative for arthralgias, myalgias and neck pain.  Skin: Negative for rash.  Neurological: Negative for dizziness, seizures, syncope and headaches.  Hematological: Negative for adenopathy. Does not bruise/bleed easily.  Psychiatric/Behavioral: Positive for dysphoric mood. The patient is nervous/anxious.    Per HPI unless specifically indicated in ROS section     Objective:    BP 136/86 (BP Location: Left Arm, Patient Position: Sitting, Cuff Size: Large)   Pulse 82   Temp 98.5 F (36.9 C) (Oral)   Resp 18   Ht 5\' 10"  (1.778 m)   Wt 255 lb (115.7 kg)   SpO2 97%   BMI 36.59 kg/m   Wt Readings from Last 3 Encounters:  10/18/16 255 lb (115.7 kg)  07/15/16 249 lb 8 oz (113.2 kg)  04/11/16 251 lb 8 oz (114.1 kg)    Physical Exam  Constitutional: He is oriented to person, place, and time. He appears well-developed and well-nourished.  No distress.  HENT:  Head: Normocephalic and atraumatic.  Right Ear: Hearing, tympanic membrane, external ear and ear canal normal.  Left Ear: Hearing, tympanic membrane, external ear and ear canal normal.  Nose: Nose normal.  Mouth/Throat: Uvula is midline, oropharynx is clear and moist and mucous membranes are normal. No oropharyngeal exudate, posterior oropharyngeal edema or posterior oropharyngeal erythema.  Eyes: Conjunctivae and EOM  are normal. Pupils are equal, round, and reactive to light. No scleral icterus.  Neck: Normal range of motion. Neck supple. No thyromegaly present.  Cardiovascular: Normal rate, regular rhythm, normal heart sounds and intact distal pulses.   No murmur heard. Pulses:      Radial pulses are 2+ on the right side, and 2+ on the left side.  Pulmonary/Chest: Effort normal and breath sounds normal. No respiratory distress. He has no wheezes. He has no rales.  Abdominal: Soft. Bowel sounds are normal. He exhibits no distension and no mass. There is no tenderness. There is no rebound and no guarding.  Musculoskeletal: Normal range of motion. He exhibits no edema.  Lymphadenopathy:    He has no cervical adenopathy.  Neurological: He is alert and oriented to person, place, and time.  CN grossly intact, station and gait intact  Skin: Skin is warm and dry. No rash noted.  Psychiatric: He has a normal mood and affect. His behavior is normal. Judgment and thought content normal.  Nursing note and vitals reviewed.  Results for orders placed or performed in visit on 10/11/16  Lipid panel  Result Value Ref Range   Cholesterol 209 (H) 0 - 200 mg/dL   Triglycerides (H) 0.0 - 149.0 mg/dL    161.0 Triglyceride is over 400; calculations on Lipids are invalid.   HDL 34.80 (L) >39.00 mg/dL   Total CHOL/HDL Ratio 6   Comprehensive metabolic panel  Result Value Ref Range   Sodium 137 135 - 145 mEq/L   Potassium 4.5 3.5 - 5.1 mEq/L   Chloride 102 96 - 112 mEq/L   CO2 30 19 - 32 mEq/L   Glucose, Bld 98 70 - 99 mg/dL   BUN 16 6 - 23 mg/dL   Creatinine, Ser 9.60 0.40 - 1.50 mg/dL   Total Bilirubin 0.5 0.2 - 1.2 mg/dL   Alkaline Phosphatase 53 39 - 117 U/L   AST 23 0 - 37 U/L   ALT 27 0 - 53 U/L   Total Protein 6.5 6.0 - 8.3 g/dL   Albumin 4.0 3.5 - 5.2 g/dL   Calcium 9.0 8.4 - 45.4 mg/dL   GFR 098.11 >91.47 mL/min  CBC with Differential/Platelet  Result Value Ref Range   WBC 4.1 4.0 - 10.5 K/uL   RBC  4.98 4.22 - 5.81 Mil/uL   Hemoglobin 15.8 13.0 - 17.0 g/dL   HCT 82.9 56.2 - 13.0 %   MCV 90.5 78.0 - 100.0 fl   MCHC 34.9 30.0 - 36.0 g/dL   RDW 86.5 78.4 - 69.6 %   Platelets 182.0 150.0 - 400.0 K/uL   Neutrophils Relative % 46.7 43.0 - 77.0 %   Lymphocytes Relative 34.6 12.0 - 46.0 %   Monocytes Relative 14.6 (H) 3.0 - 12.0 %   Eosinophils Relative 3.2 0.0 - 5.0 %   Basophils Relative 0.9 0.0 - 3.0 %   Neutro Abs 1.9 1.4 - 7.7 K/uL   Lymphs Abs 1.4 0.7 - 4.0 K/uL   Monocytes Absolute 0.6 0.1 - 1.0 K/uL   Eosinophils Absolute 0.1 0.0 - 0.7 K/uL  Basophils Absolute 0.0 0.0 - 0.1 K/uL  Valproic acid level  Result Value Ref Range   Valproic Acid Lvl 52.0 50.0 - 100.0 ug/mL  LDL cholesterol, direct  Result Value Ref Range   Direct LDL 111.0 mg/dL      Assessment & Plan:   Problem List Items Addressed This Visit    Alcohol abuse    Ongoing alcohol use despite negative consequences and marital discord. Encouraged establishing up with AA. Encouraged couples counseling - agrees. Declines substance abuse counseling.  Pt desired benzo prescription for anxiety - recommended against any controlled substance in setting of ongoing alcohol use.  Denies h/o DT or any alcohol withdrawal symptom.  Pt agrees to trial naltrexone.  Pt left upset.       Relevant Orders   Ambulatory referral to Psychology   Bipolar 2 disorder (HCC)    Continue depakote, celexa. Referred to couples counseling.       Relevant Orders   Ambulatory referral to Psychology   Health maintenance examination - Primary    Preventative protocols reviewed and updated unless pt declined. Discussed healthy diet and lifestyle.       Hypertriglyceridemia    Trig 400s. Anticipate alcohol related as last visit when drinking less, trig 157. Encouraged decreased alcohol consumption.       Obesity, Class II, BMI 35-39.9, no comorbidity    Encouraged healthy diet and lifestyle changes to affect sustainable weight loss.            Follow up plan: Return in about 3 months (around 01/18/2017) for follow up visit.  Eustaquio BoydenJavier Levii Hairfield, MD

## 2016-10-18 NOTE — Progress Notes (Signed)
Pre visit review using our clinic review tool, if applicable. No additional management support is needed unless otherwise documented below in the visit note. 

## 2016-10-18 NOTE — Patient Instructions (Addendum)
Stop hydroxyzine.  Try naloxone to decrease alcohol use.  Referral placed for couples counseling.  Return in 3 months for follow up visit.   Health Maintenance, Male A healthy lifestyle and preventative care can promote health and wellness.  Maintain regular health, dental, and eye exams.  Eat a healthy diet. Foods like vegetables, fruits, whole grains, low-fat dairy products, and lean protein foods contain the nutrients you need and are low in calories. Decrease your intake of foods high in solid fats, added sugars, and salt. Get information about a proper diet from your health care provider, if necessary.  Regular physical exercise is one of the most important things you can do for your health. Most adults should get at least 150 minutes of moderate-intensity exercise (any activity that increases your heart rate and causes you to sweat) each week. In addition, most adults need muscle-strengthening exercises on 2 or more days a week.   Maintain a healthy weight. The body mass index (BMI) is a screening tool to identify possible weight problems. It provides an estimate of body fat based on height and weight. Your health care provider can find your BMI and can help you achieve or maintain a healthy weight. For males 20 years and older:  A BMI below 18.5 is considered underweight.  A BMI of 18.5 to 24.9 is normal.  A BMI of 25 to 29.9 is considered overweight.  A BMI of 30 and above is considered obese.  Maintain normal blood lipids and cholesterol by exercising and minimizing your intake of saturated fat. Eat a balanced diet with plenty of fruits and vegetables. Blood tests for lipids and cholesterol should begin at age 45 and be repeated every 5 years. If your lipid or cholesterol levels are high, you are over age 45, or you are at high risk for heart disease, you may need your cholesterol levels checked more frequently.Ongoing high lipid and cholesterol levels should be treated with  medicines if diet and exercise are not working.  If you smoke, find out from your health care provider how to quit. If you do not use tobacco, do not start.  Lung cancer screening is recommended for adults aged 55-80 years who are at high risk for developing lung cancer because of a history of smoking. A yearly low-dose CT scan of the lungs is recommended for people who have at least a 30-pack-year history of smoking and are current smokers or have quit within the past 15 years. A pack year of smoking is smoking an average of 1 pack of cigarettes a day for 1 year (for example, a 30-pack-year history of smoking could mean smoking 1 pack a day for 30 years or 2 packs a day for 15 years). Yearly screening should continue until the smoker has stopped smoking for at least 15 years. Yearly screening should be stopped for people who develop a health problem that would prevent them from having lung cancer treatment.  If you choose to drink alcohol, do not have more than 2 drinks per day. One drink is considered to be 12 oz (360 mL) of beer, 5 oz (150 mL) of wine, or 1.5 oz (45 mL) of liquor.  Avoid the use of street drugs. Do not share needles with anyone. Ask for help if you need support or instructions about stopping the use of drugs.  High blood pressure causes heart disease and increases the risk of stroke. High blood pressure is more likely to develop in:  People who have  blood pressure in the end of the normal range (100-139/85-89 mm Hg).  People who are overweight or obese.  People who are African American.  If you are 62-35 years of age, have your blood pressure checked every 3-5 years. If you are 96 years of age or older, have your blood pressure checked every year. You should have your blood pressure measured twice-once when you are at a hospital or clinic, and once when you are not at a hospital or clinic. Record the average of the two measurements. To check your blood pressure when you are not  at a hospital or clinic, you can use:  An automated blood pressure machine at a pharmacy.  A home blood pressure monitor.  If you are 75-44 years old, ask your health care provider if you should take aspirin to prevent heart disease.  Diabetes screening involves taking a blood sample to check your fasting blood sugar level. This should be done once every 3 years after age 73 if you are at a normal weight and without risk factors for diabetes. Testing should be considered at a younger age or be carried out more frequently if you are overweight and have at least 1 risk factor for diabetes.  Colorectal cancer can be detected and often prevented. Most routine colorectal cancer screening begins at the age of 51 and continues through age 25. However, your health care provider may recommend screening at an earlier age if you have risk factors for colon cancer. On a yearly basis, your health care provider may provide home test kits to check for hidden blood in the stool. A small camera at the end of a tube may be used to directly examine the colon (sigmoidoscopy or colonoscopy) to detect the earliest forms of colorectal cancer. Talk to your health care provider about this at age 50 when routine screening begins. A direct exam of the colon should be repeated every 5-10 years through age 76, unless early forms of precancerous polyps or small growths are found.  People who are at an increased risk for hepatitis B should be screened for this virus. You are considered at high risk for hepatitis B if:  You were born in a country where hepatitis B occurs often. Talk with your health care provider about which countries are considered high risk.  Your parents were born in a high-risk country and you have not received a shot to protect against hepatitis B (hepatitis B vaccine).  You have HIV or AIDS.  You use needles to inject street drugs.  You live with, or have sex with, someone who has hepatitis B.  You  are a man who has sex with other men (MSM).  You get hemodialysis treatment.  You take certain medicines for conditions like cancer, organ transplantation, and autoimmune conditions.  Hepatitis C blood testing is recommended for all people born from [birth year] through 1965 and any individual with known risk factors for hepatitis C.  Healthy men should no longer receive prostate-specific antigen (PSA) blood tests as part of routine cancer screening. Talk to your health care provider about prostate cancer screening.  Testicular cancer screening is not recommended for adolescents or adult males who have no symptoms. Screening includes self-exam, a health care provider exam, and other screening tests. Consult with your health care provider about any symptoms you have or any concerns you have about testicular cancer.  Practice safe sex. Use condoms and avoid high-risk sexual practices to reduce the spread of sexually transmitted infections (  STIs).  You should be screened for STIs, including gonorrhea and chlamydia if:  You are sexually active and are younger than 24 years.  You are older than 24 years, and your health care provider tells you that you are at risk for this type of infection.  Your sexual activity has changed since you were last screened, and you are at an increased risk for chlamydia or gonorrhea. Ask your health care provider if you are at risk.  If you are at risk of being infected with HIV, it is recommended that you take a prescription medicine daily to prevent HIV infection. This is called pre-exposure prophylaxis (PrEP). You are considered at risk if:  You are a man who has sex with other men (MSM).  You are a heterosexual man who is sexually active with multiple partners.  You take drugs by injection.  You are sexually active with a partner who has HIV.  Talk with your health care provider about whether you are at high risk of being infected with HIV. If you choose to begin  PrEP, you should first be tested for HIV. You should then be tested every 3 months for as long as you are taking PrEP.  Use sunscreen. Apply sunscreen liberally and repeatedly throughout the day. You should seek shade when your shadow is shorter than you. Protect yourself by wearing long sleeves, pants, a wide-brimmed hat, and sunglasses year round whenever you are outdoors.  Tell your health care provider of new moles or changes in moles, especially if there is a change in shape or color. Also, tell your health care provider if a mole is larger than the size of a pencil eraser.  A one-time screening for abdominal aortic aneurysm (AAA) and surgical repair of large AAAs by ultrasound is recommended for men aged 65-75 years who are current or former smokers.  Stay current with your vaccines (immunizations). This information is not intended to replace advice given to you by your health care provider. Make sure you discuss any questions you have with your health care provider. Document Released: 05/05/2008 Document Revised: 11/28/2014 Document Reviewed: 08/11/2015 Elsevier Interactive Patient Education  2017 [employer]

## 2016-10-18 NOTE — Assessment & Plan Note (Signed)
Continue depakote, celexa. Referred to couples counseling.

## 2016-10-19 ENCOUNTER — Encounter: Payer: Self-pay | Admitting: Family Medicine

## 2016-11-03 ENCOUNTER — Telehealth: Payer: Self-pay | Admitting: Family Medicine

## 2016-11-03 NOTE — Telephone Encounter (Signed)
Noted declined beh health referral again.

## 2016-11-03 NOTE — Telephone Encounter (Signed)
Terri L Slaugenhaupt, CCC-SLP  [facility] H Thomas        FYI: We called your patient and he has declined to schedule an appointment with us, at this time.   We appreciate the referral.

## 2017-01-07 ENCOUNTER — Other Ambulatory Visit: Payer: Self-pay | Admitting: Family Medicine

## 2017-01-17 ENCOUNTER — Ambulatory Visit: Payer: Commercial Managed Care - HMO | Admitting: Family Medicine

## 2017-02-06 ENCOUNTER — Encounter: Payer: Self-pay | Admitting: Family Medicine

## 2017-02-06 ENCOUNTER — Ambulatory Visit (INDEPENDENT_AMBULATORY_CARE_PROVIDER_SITE_OTHER): Payer: Commercial Managed Care - HMO | Admitting: Family Medicine

## 2017-02-06 VITALS — BP 122/82 | HR 80 | Temp 98.3°F | Wt 244.5 lb

## 2017-02-06 DIAGNOSIS — F1010 Alcohol abuse, uncomplicated: Secondary | ICD-10-CM | POA: Diagnosis not present

## 2017-02-06 DIAGNOSIS — E669 Obesity, unspecified: Secondary | ICD-10-CM

## 2017-02-06 DIAGNOSIS — F3181 Bipolar II disorder: Secondary | ICD-10-CM | POA: Diagnosis not present

## 2017-02-06 DIAGNOSIS — E781 Pure hyperglyceridemia: Secondary | ICD-10-CM

## 2017-02-06 DIAGNOSIS — G629 Polyneuropathy, unspecified: Secondary | ICD-10-CM | POA: Insufficient documentation

## 2017-02-06 DIAGNOSIS — R202 Paresthesia of skin: Secondary | ICD-10-CM | POA: Diagnosis not present

## 2017-02-06 NOTE — Progress Notes (Signed)
Pre visit review using our clinic review tool, if applicable. No additional management support is needed unless otherwise documented below in the visit note. 

## 2017-02-06 NOTE — Assessment & Plan Note (Signed)
Congratulated on weight loss noted.  

## 2017-02-06 NOTE — Progress Notes (Signed)
BP 122/82   Pulse 80   Temp 98.3 F (36.8 C) (Oral)   Wt 244 lb 8 oz (110.9 kg)   BMI 35.08 kg/m    CC: 3 mo f/u visit Subjective:    Patient ID: Danny [name], male    DOB: 05/15/1971, 46 y.o.   MRN: [id number]  HPI: Danny [name] is a 46 y.o. male presenting on 02/06/2017 for Follow-up   Bipolar II in setting of alcohol use - declined psych referral. On depakote 500 bid as a mood stabilizer, also on celexa 10mg  bid.  Alcohol abuse - on naltrexone 50mg  daily. Quit cold [place] 3 months ago - abstinent since. Feels naltrexone has helped, tolerating med well.   Previously recommended couples counseling - saw our counselor Bambi 2-3 times with wife. Relationship with wife has improved, quitting alcohol has helped. Staying busy remodeling daughter's house.   Wakes up at night with numb and painful hands - noticed over last 3-4 weeks. Improves with hand movement. Strenuous work at work.  Relevant past medical, surgical, family and social history reviewed and updated as indicated. Interim medical history since our last visit reviewed. Allergies and medications reviewed and updated. Outpatient Medications Prior to Visit  Medication Sig Dispense Refill  . acetaminophen (TYLENOL) 500 MG tablet Take 500 mg by mouth every 6 (six) hours as needed.    . citalopram (CELEXA) 10 MG tablet TAKE 1 TABLET BY MOUTH 2 TIMES DAILY. 180 tablet 0  . divalproex (DEPAKOTE) 500 MG DR tablet Take one in am and two in pm (Patient taking differently: Take 500 mg by mouth 2 (two) times daily. ) 270 tablet 1  . naltrexone (DEPADE) 50 MG tablet Take 1 tablet (50 mg total) by mouth daily. 30 tablet 3   No facility-administered medications prior to visit.      Per HPI unless specifically indicated in ROS section below Review of Systems     Objective:    BP 122/82   Pulse 80   Temp 98.3 F (36.8 C) (Oral)   Wt 244 lb 8 oz (110.9 kg)   BMI 35.08 kg/m   Wt Readings from Last 3 Encounters:  02/06/17  244 lb 8 oz (110.9 kg)  10/18/16 255 lb (115.7 kg)  07/15/16 249 lb 8 oz (113.2 kg)    Physical Exam  Constitutional: He appears well-developed and well-nourished. No distress.  HENT:  Mouth/Throat: Oropharynx is clear and moist. No oropharyngeal exudate.  Cardiovascular: Normal rate, regular rhythm, normal heart sounds and intact distal pulses.   No murmur heard. Pulmonary/Chest: Effort normal and breath sounds normal. No respiratory distress. He has no wheezes. He has no rales.  Musculoskeletal: He exhibits no edema.  Skin: Skin is warm and dry. No rash noted.  Psychiatric: He has a normal mood and affect. His behavior is normal. Judgment and thought content normal.  Nursing note and vitals reviewed.  Results for orders placed or performed in visit on 10/11/16  Lipid panel  Result Value Ref Range   Cholesterol 209 (H) 0 - 200 mg/dL   Triglycerides (H) 0.0 - 149.0 mg/dL    086.5 Triglyceride is over 400; calculations on Lipids are invalid.   HDL 34.80 (L) >39.00 mg/dL   Total CHOL/HDL Ratio 6   Comprehensive metabolic panel  Result Value Ref Range   Sodium 137 135 - 145 mEq/L   Potassium 4.5 3.5 - 5.1 mEq/L   Chloride 102 96 - 112 mEq/L   CO2 30 19 -  32 mEq/L   Glucose, Bld 98 70 - 99 mg/dL   BUN 16 6 - 23 mg/dL   Creatinine, Ser 2.130.77 0.40 - 1.50 mg/dL   Total Bilirubin 0.5 0.2 - 1.2 mg/dL   Alkaline Phosphatase 53 39 - 117 U/L   AST 23 0 - 37 U/L   ALT 27 0 - 53 U/L   Total Protein 6.5 6.0 - 8.3 g/dL   Albumin 4.0 3.5 - 5.2 g/dL   Calcium 9.0 8.4 - 08.610.5 mg/dL   GFR [id number] >96.29>60.00 mL/min  CBC with Differential/Platelet  Result Value Ref Range   WBC 4.1 4.0 - 10.5 K/uL   RBC 4.98 4.22 - 5.81 Mil/uL   Hemoglobin 15.8 13.0 - 17.0 g/dL   HCT 52.845.1 41.339.0 - 24.452.0 %   MCV 90.5 78.0 - 100.0 fl   MCHC 34.9 30.0 - 36.0 g/dL   RDW 01.012.6 27.211.5 - 53.615.5 %   Platelets 182.0 150.0 - 400.0 K/uL   Neutrophils Relative % 46.7 43.0 - 77.0 %   Lymphocytes Relative 34.6 12.0 - 46.0 %    Monocytes Relative 14.6 (H) 3.0 - 12.0 %   Eosinophils Relative 3.2 0.0 - 5.0 %   Basophils Relative 0.9 0.0 - 3.0 %   Neutro Abs 1.9 1.4 - 7.7 K/uL   Lymphs Abs 1.4 0.7 - 4.0 K/uL   Monocytes Absolute 0.6 0.1 - 1.0 K/uL   Eosinophils Absolute 0.1 0.0 - 0.7 K/uL   Basophils Absolute 0.0 0.0 - 0.1 K/uL  Valproic acid level  Result Value Ref Range   Valproic Acid Lvl 52.0 50.0 - 100.0 ug/mL  LDL cholesterol, direct  Result Value Ref Range   Direct LDL 111.0 mg/dL      Assessment & Plan:   Problem List Items Addressed This Visit    Alcohol abuse    Congratulated on abstinence over the past 3 months. Naltrexone has helped. Will continue. Consider stopping at next visit. Pt agrees with plan.       Bipolar 2 disorder (HCC) - Primary    Stable on depakote, celexa. Consider titration at next visit now that he's abstinent from alcohol.  Couples counseling has helped as well.       Hypertriglyceridemia    Update trig off alcohol.       Relevant Orders   Triglycerides   Obesity, Class II, BMI 35-39.9, no comorbidity    Congratulated on weight loss noted.       Paresthesias    Night time - ?CTS bilaterally. Given alcohol history, check folate and B12. Discussed wrist brace use at night.       Relevant Orders   Vitamin B12   Folate       Follow up plan: Return in about 4 months (around 06/08/2017) for follow up visit.  Eustaquio BoydenJavier Shannah Conteh, MD

## 2017-02-06 NOTE — Assessment & Plan Note (Signed)
Update trig off alcohol.

## 2017-02-06 NOTE — Assessment & Plan Note (Addendum)
Stable on depakote, celexa. Consider titration at next visit now that he's abstinent from alcohol.  Couples counseling has helped as well.

## 2017-02-06 NOTE — Patient Instructions (Addendum)
Congratulations on healthy lifestyle changes! Continue current medicines for next few months.  Return in 4 months for follow up visit.

## 2017-02-06 NOTE — Assessment & Plan Note (Signed)
Congratulated on abstinence over the past 3 months. Naltrexone has helped. Will continue. Consider stopping at next visit. Pt agrees with plan.

## 2017-02-06 NOTE — Assessment & Plan Note (Signed)
Night time - ?CTS bilaterally. Given alcohol history, check folate and B12. Discussed wrist brace use at night.

## 2017-02-07 LAB — FOLATE: Folate: 10.4 ng/mL (ref >5.9)

## 2017-02-07 LAB — TRIGLYCERIDES: TRIGLYCERIDES: 250 mg/dL — AB (ref 0.0–149.0)

## 2017-02-07 LAB — VITAMIN B12: Vitamin B-12: 490 pg/mL (ref 211–911)

## 2017-04-04 ENCOUNTER — Encounter: Payer: Self-pay | Admitting: Family Medicine

## 2017-04-04 NOTE — Telephone Encounter (Signed)
Received refill request today, please see mychart message, last filled on 10/18/16 #30 with 3 additional refills

## 2017-04-05 MED ORDER — NALTREXONE HCL 50 MG PO TABS: 50.0000 mg | ORAL_TABLET | Freq: Every day | ORAL | 3 refills | Status: DC

## 2017-04-12 DIAGNOSIS — J301 Allergic rhinitis due to pollen: Secondary | ICD-10-CM | POA: Diagnosis not present

## 2017-04-12 DIAGNOSIS — J00 Acute nasopharyngitis [common cold]: Secondary | ICD-10-CM | POA: Diagnosis not present

## 2017-04-12 DIAGNOSIS — H66002 Acute suppurative otitis media without spontaneous rupture of ear drum, left ear: Secondary | ICD-10-CM | POA: Diagnosis not present

## 2017-06-08 ENCOUNTER — Ambulatory Visit: Payer: Commercial Managed Care - HMO | Admitting: Family Medicine

## 2017-06-16 ENCOUNTER — Ambulatory Visit (INDEPENDENT_AMBULATORY_CARE_PROVIDER_SITE_OTHER): Payer: 59 | Admitting: Family Medicine

## 2017-06-16 ENCOUNTER — Encounter: Payer: Self-pay | Admitting: Family Medicine

## 2017-06-16 VITALS — BP 108/72 | HR 80 | Temp 98.6°F | Wt 236.0 lb

## 2017-06-16 DIAGNOSIS — F3181 Bipolar II disorder: Secondary | ICD-10-CM

## 2017-06-16 DIAGNOSIS — F1010 Alcohol abuse, uncomplicated: Secondary | ICD-10-CM

## 2017-06-16 DIAGNOSIS — E669 Obesity, unspecified: Secondary | ICD-10-CM | POA: Diagnosis not present

## 2017-06-16 MED ORDER — HYDROXYZINE HCL 25 MG PO TABS: 12.5000 mg | ORAL_TABLET | Freq: Two times a day (BID) | ORAL | 0 refills | Status: DC | PRN

## 2017-06-16 MED ORDER — CITALOPRAM HYDROBROMIDE 10 MG PO TABS: 10.0000 mg | ORAL_TABLET | Freq: Two times a day (BID) | ORAL | 1 refills | Status: DC

## 2017-06-16 MED ORDER — NALTREXONE HCL 50 MG PO TABS: 50.0000 mg | ORAL_TABLET | Freq: Every day | ORAL | 1 refills | Status: DC

## 2017-06-16 MED ORDER — DIVALPROEX SODIUM 500 MG PO DR TAB: 500.0000 mg | DELAYED_RELEASE_TABLET | Freq: Two times a day (BID) | ORAL | 1 refills | Status: DC

## 2017-06-16 NOTE — Assessment & Plan Note (Signed)
8 lb weight loss noted - pt not really trying but pt thinks likely combination of summer and less alcohol.

## 2017-06-16 NOTE — Assessment & Plan Note (Addendum)
Will let me know if agrees to psychiatry referral. At this time, pt and wife feel he's stable on current regimen of depakote 500mg  BID, celexa 10mg  BID, and naltrexone 50mg  daily. Will continue this. Did meet with Bambi our counselor for limited amt sessions of couples counseling with wife which was helpful but he declined further therapy.  Will refill hydroxyzine PRN anxiety.

## 2017-06-16 NOTE — Patient Instructions (Addendum)
Look into AA.  Continue current medicines. Return in 4 months (after 10/18/2017) for physical and prior for fasting labs.

## 2017-06-16 NOTE — Assessment & Plan Note (Addendum)
Fully abstinent from 1-03/2017, since then has relapsed a few times, most recently last week where he drank large amt of alcohol after argument with 46 yo son while wife was out of town. He finds naltrexone is helpful to control urge, desires to continue.  Discussed importance of finding healthy stress relieving strategies rather than relying on alcohol for stress relief. I encouraged he look into AA - he has the information at home.

## 2017-06-16 NOTE — Progress Notes (Addendum)
BP 108/72 (BP Location: Left Arm, Patient Position: Sitting, Cuff Size: Large)   Pulse 80   Temp 98.6 F (37 C) (Oral)   Wt 236 lb (107 kg)   SpO2 95%   BMI 33.86 kg/m    CC: 4 mo f/u visit Subjective:    Patient ID: Danny [name], male    DOB: Sep 18, 1971, 46 y.o.   MRN: [id number]  HPI: Danny [name] is a 46 y.o. male presenting on 06/16/2017 for Bipolar 4 Month Follow-up   Here with wife today. Looking forward to upcoming cruise.  See prior note for details. Dx bipolar II in setting alcohol use - previously declined psychiatry referral. We started depakote 500mg  bid as mood stabilizer along with his celexa 10mg  bid.   He did see counselor Bambi with his wife for couples counseling.   Alcohol abuse - on naltrexone 50mg  daily for last 7 months - quit cold [place] 10/2016 and abstinent through 03/2017. He has had some slides back into drinking when goes off naltrexone, or with higher stress situations - ie argument with son, or when wife is out of town.   Relevant past medical, surgical, family and social history reviewed and updated as indicated. Interim medical history since our last visit reviewed. Allergies and medications reviewed and updated. Outpatient Medications Prior to Visit  Medication Sig Dispense Refill  . acetaminophen (TYLENOL) 500 MG tablet Take 500 mg by mouth every 6 (six) hours as needed.    . citalopram (CELEXA) 10 MG tablet TAKE 1 TABLET BY MOUTH 2 TIMES DAILY. 180 tablet 0  . divalproex (DEPAKOTE) 500 MG DR tablet Take one in am and two in pm (Patient taking differently: Take 500 mg by mouth 2 (two) times daily. ) 270 tablet 1  . naltrexone (DEPADE) 50 MG tablet Take 1 tablet (50 mg total) by mouth daily. 30 tablet 3   No facility-administered medications prior to visit.      Per HPI unless specifically indicated in ROS section below Review of Systems     Objective:    BP 108/72 (BP Location: Left Arm, Patient Position: Sitting, Cuff Size: Large)    Pulse 80   Temp 98.6 F (37 C) (Oral)   Wt 236 lb (107 kg)   SpO2 95%   BMI 33.86 kg/m   Wt Readings from Last 3 Encounters:  06/16/17 236 lb (107 kg)  02/06/17 244 lb 8 oz (110.9 kg)  10/18/16 255 lb (115.7 kg)    Physical Exam  Constitutional: He appears well-developed and well-nourished. No distress.  HENT:  Mouth/Throat: Oropharynx is clear and moist. No oropharyngeal exudate.  Eyes: Pupils are equal, round, and reactive to light. Conjunctivae and EOM are normal. No scleral icterus.  Neck: Normal range of motion. Neck supple. No thyromegaly present.  Cardiovascular: Normal rate, regular rhythm, normal heart sounds and intact distal pulses.   No murmur heard. Pulmonary/Chest: Effort normal and breath sounds normal. No respiratory distress. He has no wheezes. He has no rales.  Abdominal: Soft. Normal appearance and bowel sounds are normal. He exhibits no distension and no mass. There is no tenderness. There is no rigidity, no rebound, no guarding, no CVA tenderness and negative Murphy's sign.  Musculoskeletal: He exhibits no edema.  Skin: Skin is warm and dry. No rash noted.  Psychiatric: He has a normal mood and affect.  Nursing note and vitals reviewed.  Results for orders placed or performed in visit on 02/06/17  Vitamin B12  Result Value  Ref Range   Vitamin B-12 490 211 - 911 pg/mL  Folate  Result Value Ref Range   Folate 10.4 >5.9 ng/mL  Triglycerides  Result Value Ref Range   Triglycerides 250.0 (H) 0.0 - 149.0 mg/dL      Assessment & Plan:   Problem List Items Addressed This Visit    Alcohol abuse    Fully abstinent from 1-03/2017, since then has relapsed a few times, most recently last week where he drank large amt of alcohol after argument with 46 yo son while wife was out of town. He finds naltrexone is helpful to control urge, desires to continue.  Discussed importance of finding healthy stress relieving strategies rather than relying on alcohol for stress  relief. I encouraged he look into AA - he has the information at home.      Bipolar 2 disorder (HCC) - Primary    Will let me know if agrees to psychiatry referral. At this time, pt and wife feel he's stable on current regimen of depakote 500mg  BID, celexa 10mg  BID, and naltrexone 50mg  daily. Will continue this. Did meet with Bambi our counselor for limited amt sessions of couples counseling with wife which was helpful but he declined further therapy.  Will refill hydroxyzine PRN anxiety.       Obesity, Class I, BMI 30-34.9    8 lb weight loss noted - pt not really trying but pt thinks likely combination of summer and less alcohol.           Follow up plan: Return in about 4 months (around 10/17/2017), or if symptoms worsen or fail to improve, for annual exam, prior fasting for blood work.  Eustaquio BoydenJavier Fallon Haecker, MD

## 2017-08-01 ENCOUNTER — Other Ambulatory Visit: Payer: Self-pay | Admitting: Family Medicine

## 2017-10-16 ENCOUNTER — Other Ambulatory Visit: Payer: Self-pay | Admitting: Family Medicine

## 2017-10-16 DIAGNOSIS — E781 Pure hyperglyceridemia: Secondary | ICD-10-CM

## 2017-10-16 DIAGNOSIS — R202 Paresthesia of skin: Secondary | ICD-10-CM

## 2017-10-16 DIAGNOSIS — F1010 Alcohol abuse, uncomplicated: Secondary | ICD-10-CM

## 2017-10-16 DIAGNOSIS — F3181 Bipolar II disorder: Secondary | ICD-10-CM

## 2017-10-18 ENCOUNTER — Other Ambulatory Visit: Payer: 59

## 2017-10-23 ENCOUNTER — Encounter: Payer: 59 | Admitting: Family Medicine

## 2017-10-27 ENCOUNTER — Other Ambulatory Visit (INDEPENDENT_AMBULATORY_CARE_PROVIDER_SITE_OTHER): Payer: 59

## 2017-10-27 DIAGNOSIS — F1010 Alcohol abuse, uncomplicated: Secondary | ICD-10-CM | POA: Diagnosis not present

## 2017-10-27 DIAGNOSIS — F3181 Bipolar II disorder: Secondary | ICD-10-CM | POA: Diagnosis not present

## 2017-10-27 DIAGNOSIS — E781 Pure hyperglyceridemia: Secondary | ICD-10-CM | POA: Diagnosis not present

## 2017-10-27 LAB — LIPID PANEL
CHOLESTEROL: 193 mg/dL (ref 0–200)
HDL: 45.4 mg/dL (ref >39.00)
NonHDL: 147.28
Total CHOL/HDL Ratio: 4
Triglycerides: 210 mg/dL — ABNORMAL HIGH (ref 0.0–149.0)
VLDL: 42 mg/dL — AB (ref 0.0–40.0)

## 2017-10-27 LAB — CBC WITH DIFFERENTIAL/PLATELET
BASOS ABS: 0.1 10*3/uL (ref 0.0–0.1)
Basophils Relative: 1 % (ref 0.0–3.0)
EOS PCT: 1.7 % (ref 0.0–5.0)
Eosinophils Absolute: 0.1 10*3/uL (ref 0.0–0.7)
HCT: 48.1 % (ref 39.0–52.0)
HEMOGLOBIN: 16.5 g/dL (ref 13.0–17.0)
Lymphocytes Relative: 34.9 % (ref 12.0–46.0)
Lymphs Abs: 2 10*3/uL (ref 0.7–4.0)
MCHC: 34.3 g/dL (ref 30.0–36.0)
MCV: 93.9 fl (ref 78.0–100.0)
MONO ABS: 1 10*3/uL (ref 0.1–1.0)
MONOS PCT: 17 % — AB (ref 3.0–12.0)
Neutro Abs: 2.6 10*3/uL (ref 1.4–7.7)
Neutrophils Relative %: 45.4 % (ref 43.0–77.0)
Platelets: 227 10*3/uL (ref 150.0–400.0)
RBC: 5.13 Mil/uL (ref 4.22–5.81)
RDW: 13 % (ref 11.5–15.5)
WBC: 5.7 10*3/uL (ref 4.0–10.5)

## 2017-10-27 LAB — COMPREHENSIVE METABOLIC PANEL
ALK PHOS: 51 U/L (ref 39–117)
ALT: 37 U/L (ref 0–53)
AST: 27 U/L (ref 0–37)
Albumin: 4.6 g/dL (ref 3.5–5.2)
BILIRUBIN TOTAL: 0.6 mg/dL (ref 0.2–1.2)
BUN: 15 mg/dL (ref 6–23)
CO2: 30 mEq/L (ref 19–32)
Calcium: 9.2 mg/dL (ref 8.4–10.5)
Chloride: 100 mEq/L (ref 96–112)
Creatinine, Ser: 0.82 mg/dL (ref 0.40–1.50)
GFR: 107.34 mL/min (ref >60.00)
GLUCOSE: 98 mg/dL (ref 70–99)
POTASSIUM: 4.3 meq/L (ref 3.5–5.1)
SODIUM: 138 meq/L (ref 135–145)
TOTAL PROTEIN: 7 g/dL (ref 6.0–8.3)

## 2017-10-27 LAB — LDL CHOLESTEROL, DIRECT: LDL DIRECT: 119 mg/dL

## 2017-10-31 ENCOUNTER — Encounter: Payer: 59 | Admitting: Family Medicine

## 2017-11-03 ENCOUNTER — Ambulatory Visit (INDEPENDENT_AMBULATORY_CARE_PROVIDER_SITE_OTHER): Payer: 59 | Admitting: Family Medicine

## 2017-11-03 ENCOUNTER — Encounter: Payer: Self-pay | Admitting: Family Medicine

## 2017-11-03 VITALS — BP 134/80 | HR 81 | Temp 98.6°F | Ht 70.0 in | Wt 238.5 lb

## 2017-11-03 DIAGNOSIS — F3181 Bipolar II disorder: Secondary | ICD-10-CM

## 2017-11-03 DIAGNOSIS — R05 Cough: Secondary | ICD-10-CM | POA: Diagnosis not present

## 2017-11-03 DIAGNOSIS — G629 Polyneuropathy, unspecified: Secondary | ICD-10-CM | POA: Diagnosis not present

## 2017-11-03 DIAGNOSIS — Z125 Encounter for screening for malignant neoplasm of prostate: Secondary | ICD-10-CM

## 2017-11-03 DIAGNOSIS — E781 Pure hyperglyceridemia: Secondary | ICD-10-CM

## 2017-11-03 DIAGNOSIS — Z0000 Encounter for general adult medical examination without abnormal findings: Secondary | ICD-10-CM

## 2017-11-03 DIAGNOSIS — F1010 Alcohol abuse, uncomplicated: Secondary | ICD-10-CM

## 2017-11-03 DIAGNOSIS — J329 Chronic sinusitis, unspecified: Secondary | ICD-10-CM

## 2017-11-03 DIAGNOSIS — R059 Cough, unspecified: Secondary | ICD-10-CM

## 2017-11-03 LAB — PSA: PSA: 0.67 ng/mL (ref 0.10–4.00)

## 2017-11-03 MED ORDER — GUAIFENESIN-CODEINE 100-10 MG/5ML PO SYRP: 5.0000 mL | ORAL_SOLUTION | Freq: Every evening | ORAL | 0 refills | Status: DC | PRN

## 2017-11-03 NOTE — Patient Instructions (Addendum)
Try nasal steroid (rhinocort or flonase) for nasal congestion.  May use codeine cough syrup, let me know if worsening productive cough or fever > 101 or not improving over time. Don't mix alcohol with codeine.  PSA check today.  Keep appointment with Bambi.  Medicines refilled.  We will be in touch with alcohol couseling/rehab program.  I recommend AA meeting.   Health Maintenance, Male A healthy lifestyle and preventive care is important for your health and wellness. Ask your health care provider about what schedule of regular examinations is right for you. What should I know about weight and diet? Eat a Healthy Diet  Eat plenty of vegetables, fruits, whole grains, low-fat dairy products, and lean protein.  Do not eat a lot of foods high in solid fats, added sugars, or salt.  Maintain a Healthy Weight Regular exercise can help you achieve or maintain a healthy weight. You should:  Do at least 150 minutes of exercise each week. The exercise should increase your heart rate and make you sweat (moderate-intensity exercise).  Do strength-training exercises at least twice a week.  Watch Your Levels of Cholesterol and Blood Lipids  Have your blood tested for lipids and cholesterol every 5 years starting at 46 years of age. If you are at high risk for heart disease, you should start having your blood tested when you are 46 years old. You may need to have your cholesterol levels checked more often if: ? Your lipid or cholesterol levels are high. ? You are older than 46 years of age. ? You are at high risk for heart disease.  What should I know about cancer screening? Many types of cancers can be detected early and may often be prevented. Lung Cancer  You should be screened every year for lung cancer if: ? You are a current smoker who has smoked for at least 30 years. ? You are a former smoker who has quit within the past 15 years.  Talk to your health care provider about your screening  options, when you should start screening, and how often you should be screened.  Colorectal Cancer  Routine colorectal cancer screening usually begins at 46 years of age and should be repeated every 5-10 years until you are 46 years old. You may need to be screened more often if early forms of precancerous polyps or small growths are found. Your health care provider may recommend screening at an earlier age if you have risk factors for colon cancer.  Your health care provider may recommend using home test kits to check for hidden blood in the stool.  A small camera at the end of a tube can be used to examine your colon (sigmoidoscopy or colonoscopy). This checks for the earliest forms of colorectal cancer.  Prostate and Testicular Cancer  Depending on your age and overall health, your health care provider may do certain tests to screen for prostate and testicular cancer.  Talk to your health care provider about any symptoms or concerns you have about testicular or prostate cancer.  Skin Cancer  Check your skin from head to toe regularly.  Tell your health care provider about any new moles or changes in moles, especially if: ? There is a change in a mole's size, shape, or color. ? You have a mole that is larger than a pencil eraser.  Always use sunscreen. Apply sunscreen liberally and repeat throughout the day.  Protect yourself by wearing long sleeves, pants, a wide-brimmed hat, and sunglasses when  outside.  What should I know about heart disease, diabetes, and high blood pressure?  If you are 9-80 years of age, have your blood pressure checked every 3-5 years. If you are 24 years of age or older, have your blood pressure checked every year. You should have your blood pressure measured twice-once when you are at a hospital or clinic, and once when you are not at a hospital or clinic. Record the average of the two measurements. To check your blood pressure when you are not at a hospital  or clinic, you can use: ? An automated blood pressure machine at a pharmacy. ? A home blood pressure monitor.  Talk to your health care provider about your target blood pressure.  If you are between 33-35 years old, ask your health care provider if you should take aspirin to prevent heart disease.  Have regular diabetes screenings by checking your fasting blood sugar level. ? If you are at a normal weight and have a low risk for diabetes, have this test once every three years after the age of 53. ? If you are overweight and have a high risk for diabetes, consider being tested at a younger age or more often.  A one-time screening for abdominal aortic aneurysm (AAA) by ultrasound is recommended for men aged 84-75 years who are current or former smokers. What should I know about preventing infection? Hepatitis B If you have a higher risk for hepatitis B, you should be screened for this virus. Talk with your health care provider to find out if you are at risk for hepatitis B infection. Hepatitis C Blood testing is recommended for:  Everyone born from [birth year] through 1965.  Anyone with known risk factors for hepatitis C.  Sexually Transmitted Diseases (STDs)  You should be screened each year for STDs including gonorrhea and chlamydia if: ? You are sexually active and are younger than 46 years of age. ? You are older than 46 years of age and your health care provider tells you that you are at risk for this type of infection. ? Your sexual activity has changed since you were last screened and you are at an increased risk for chlamydia or gonorrhea. Ask your health care provider if you are at risk.  Talk with your health care provider about whether you are at high risk of being infected with HIV. Your health care provider may recommend a prescription medicine to help prevent HIV infection.  What else can I do?  Schedule regular health, dental, and eye exams.  Stay current with your vaccines  (immunizations).  Do not use any tobacco products, such as cigarettes, chewing tobacco, and e-cigarettes. If you need help quitting, ask your health care provider.  Limit alcohol intake to no more than 2 drinks per day. One drink equals 12 ounces of beer, 5 ounces of wine, or 1 ounces of hard liquor.  Do not use street drugs.  Do not share needles.  Ask your health care provider for help if you need support or information about quitting drugs.  Tell your health care provider if you often feel depressed.  Tell your health care provider if you have ever been abused or do not feel safe at home. This information is not intended to replace advice given to you by your health care provider. Make sure you discuss any questions you have with your health care provider. Document Released: 05/05/2008 Document Revised: 07/06/2016 Document Reviewed: 08/11/2015 Elsevier Interactive Patient Education  [employer].

## 2017-11-03 NOTE — Progress Notes (Signed)
BP 134/80 (BP Location: Left Arm, Patient Position: Sitting, Cuff Size: Large)   Pulse 81   Temp 98.6 F (37 C) (Oral)   Ht 5\' 10"  (1.778 m)   Wt 238 lb 8 oz (108.2 kg)   SpO2 97%   BMI 34.22 kg/m    CC: CPE Subjective:    Patient ID: Danny [name], male    DOB: 1971/04/07, 46 y.o.   MRN: [id number]  HPI: Danny [name] is a 46 y.o. male presenting on 11/03/2017 for Annual Exam   URI sxs over the last week - cough worse at night time, without fevers.   Dx bipolar II in setting of alcohol use - previously declined psychiatry referral. We started depakote 500mg  bid as mood stabilizer along with his celexa 10mg  bid. He did see counselor Bambi with his wife for couples counseling for a couple of sessions. Also uses hydroxyzine PRN anxiety.   Alcohol use - has abstained with help of naltrexone 50mg  daily, relapses when off med or with higher stress situations. Since 04/2017 he has restarted drinking liquor and beer. Has missed work. Willing to go to substance abuse program.   Preventative: Prostate cancer screening - discussed, declines at this time, but agrees to start next year. fmhx prostate cancer.  Flu shot - declines Tdap 2010.  Seat belt use discussed.  Sunscreen use discussed. No changing moles on skin.  Ex smoker. Quit 2011 - some cigarettes here and there when drinking Alcohol abuse - ongoing binging   [place], [place] Lives with wife and 2 kids (daughter Wyn ForsterMadison, son Genelle BalBrett) Occ: [employer] (contracting) Activity: stays active at work Diet: lots of milk, some water, some fruits/vegetables  Relevant past medical, surgical, family and social history reviewed and updated as indicated. Interim medical history since our last visit reviewed. Allergies and medications reviewed and updated. Outpatient Medications Prior to Visit  Medication Sig Dispense Refill  . citalopram (CELEXA) 10 MG tablet Take 1 tablet (10 mg total) by mouth 2 (two) times  daily. 180 tablet 1  . divalproex (DEPAKOTE) 500 MG DR tablet Take 1 tablet (500 mg total) by mouth 2 (two) times daily. 180 tablet 1  . hydrOXYzine (ATARAX/VISTARIL) 25 MG tablet Take 0.5-1 tablets (12.5-25 mg total) by mouth 2 (two) times daily as needed. 30 tablet 0  . divalproex (DEPAKOTE) 500 MG DR tablet TAKE 1 TABLET BY MOUTH IN THE MORNING AND 2 TABLETS IN THE EVENING 270 tablet 0  . naltrexone (DEPADE) 50 MG tablet Take 1 tablet (50 mg total) by mouth daily. (Patient not taking: Reported on 11/04/2017) 90 tablet 1   No facility-administered medications prior to visit.      Per HPI unless specifically indicated in ROS section below Review of Systems  Constitutional: Negative for activity change, appetite change, chills, fatigue, fever and unexpected weight change.  HENT: Positive for congestion. Negative for hearing loss.   Eyes: Negative for visual disturbance.  Respiratory: Positive for cough (productive). Negative for chest tightness, shortness of breath and wheezing.   Cardiovascular: Negative for chest pain, palpitations and leg swelling.  Gastrointestinal: Positive for diarrhea (loose bowel movements). Negative for abdominal distention, abdominal pain, blood in stool, constipation, nausea and vomiting.  Genitourinary: Negative for difficulty urinating and hematuria.  Musculoskeletal: Negative for arthralgias, myalgias and neck pain.       Foot pain at soles and sides Burning pain of feet  Skin: Negative for rash.  Neurological: Negative for dizziness, seizures,  syncope and headaches.  Hematological: Negative for adenopathy. Does not bruise/bleed easily.  Psychiatric/Behavioral: Negative for dysphoric mood. The patient is not nervous/anxious.        Objective:    BP 134/80 (BP Location: Left Arm, Patient Position: Sitting, Cuff Size: Large)   Pulse 81   Temp 98.6 F (37 C) (Oral)   Ht 5\' 10"  (1.778 m)   Wt 238 lb 8 oz (108.2 kg)   SpO2 97%   BMI 34.22 kg/m   Wt  Readings from Last 3 Encounters:  11/03/17 238 lb 8 oz (108.2 kg)  06/16/17 236 lb (107 kg)  02/06/17 244 lb 8 oz (110.9 kg)    Physical Exam  Constitutional: He is oriented to person, place, and time. He appears well-developed and well-nourished. No distress.  HENT:  Head: Normocephalic and atraumatic.  Right Ear: Hearing, tympanic membrane, external ear and ear canal normal.  Left Ear: Hearing, tympanic membrane, external ear and ear canal normal.  Nose: Nose normal.  Mouth/Throat: Uvula is midline, oropharynx is clear and moist and mucous membranes are normal. No oropharyngeal exudate, posterior oropharyngeal edema or posterior oropharyngeal erythema.  Eyes: Conjunctivae and EOM are normal. Pupils are equal, round, and reactive to light. No scleral icterus.  Neck: Normal range of motion. Neck supple. No thyromegaly present.  Cardiovascular: Normal rate, regular rhythm, normal heart sounds and intact distal pulses.  No murmur heard. Pulses:      Radial pulses are 2+ on the right side, and 2+ on the left side.  Pulmonary/Chest: Effort normal and breath sounds normal. No respiratory distress. He has no wheezes. He has no rales.  Lungs clear  Abdominal: Soft. Bowel sounds are normal. He exhibits no distension and no mass. There is no tenderness. There is no rebound and no guarding.  Genitourinary: Rectum normal and prostate normal. Rectal exam shows no external hemorrhoid, no internal hemorrhoid, no fissure, no mass, no tenderness and anal tone normal. Prostate is not enlarged (20gm) and not tender.  Musculoskeletal: Normal range of motion. He exhibits no edema.  Lymphadenopathy:    He has no cervical adenopathy.  Neurological: He is alert and oriented to person, place, and time.  CN grossly intact, station and gait intact  Skin: Skin is warm and dry. No rash noted.  Psychiatric: He has a normal mood and affect. His behavior is normal. Judgment and thought content normal.  Nursing note  and vitals reviewed.  Results for orders placed or performed in visit on 11/03/17  PSA  Result Value Ref Range   PSA 0.67 0.10 - 4.00 ng/mL   Lab Results  Component Value Date   VITAMINB12 490 02/06/2017        Assessment & Plan:   Problem List Items Addressed This Visit    Alcohol abuse    Did well earlier this year from January to May off alcohol.  Recently has relapsed. Alcohol affecting marital and family relationships as well as health (neuropathy presumed due to alcohol). Once missed work due to impairment, seeked care at local alcohol treatment center but they did not accept his insurance.  He and wife are interested in referral to local alcohol rehab program.  [facility]).  I also continued to recommend involvement in local AA.  He has couples counseling planned with Bambi next month.       Relevant Orders   Ambulatory referral to Psychology   Bipolar 2 disorder (HCC)  Anticipate bipolar 2 in setting of alcohol abuse. Continue depakote and celexa.       Chronic congestion of paranasal sinus    rec against ongoing afrin use.       Cough    Anticipate viral given short duration. Supportive care reviewed. Rx cheratussin - advised not to take with naltrexone.       Health maintenance examination - Primary    Preventative protocols reviewed and updated unless pt declined. Discussed healthy diet and lifestyle.       Hypertriglyceridemia    Discussed cholesterol levels. Encouraged diet changes and decreased alcohol to manage hypertriglyceridemia. The 10-year ASCVD risk score Denman George(Goff DC Montez HagemanJr., et al., 2013) is: 2.6%   Values used to calculate the score:     Age: [age] years     Sex: Male     Is Non-Hispanic African American: No     Diabetic: No     Tobacco smoker: No     Systolic Blood Pressure: 134 mmHg     Is BP treated: No     HDL Cholesterol: 45.4 mg/dL     Total Cholesterol: 193  mg/dL       Neuropathy    Worsening pain and numbness of feet - anticipate alcohol related - discussed.        Other Visit Diagnoses    Special screening for malignant neoplasm of prostate       Relevant Orders   PSA (Completed)       Follow up plan: Return if symptoms worsen or fail to improve.  Eustaquio BoydenJavier Miette Molenda, MD

## 2017-11-03 NOTE — Assessment & Plan Note (Signed)
Preventative protocols reviewed and updated unless pt declined. Discussed healthy diet and lifestyle.  

## 2017-11-03 NOTE — Assessment & Plan Note (Addendum)
Worsening pain and numbness of feet - anticipate alcohol related - discussed.

## 2017-11-04 DIAGNOSIS — R059 Cough, unspecified: Secondary | ICD-10-CM | POA: Insufficient documentation

## 2017-11-04 DIAGNOSIS — R05 Cough: Secondary | ICD-10-CM | POA: Insufficient documentation

## 2017-11-04 NOTE — Assessment & Plan Note (Signed)
Discussed cholesterol levels. Encouraged diet changes and decreased alcohol to manage hypertriglyceridemia. The 10-year ASCVD risk score Denman George(Goff DC Montez HagemanJr., et al., 2013) is: 2.6%   Values used to calculate the score:     Age: [age] years     Sex: Male     Is Non-Hispanic African American: No     Diabetic: No     Tobacco smoker: No     Systolic Blood Pressure: 134 mmHg     Is BP treated: No     HDL Cholesterol: 45.4 mg/dL     Total Cholesterol: 193 mg/dL

## 2017-11-04 NOTE — Assessment & Plan Note (Signed)
Anticipate viral given short duration. Supportive care reviewed. Rx cheratussin - advised not to take with naltrexone.

## 2017-11-04 NOTE — Assessment & Plan Note (Signed)
Anticipate bipolar 2 in setting of alcohol abuse. Continue depakote and celexa.

## 2017-11-04 NOTE — Assessment & Plan Note (Signed)
rec against ongoing afrin use.

## 2017-11-04 NOTE — Assessment & Plan Note (Addendum)
Did well earlier this year from January to May off alcohol.  Recently has relapsed. Alcohol affecting marital and family relationships as well as health (neuropathy presumed due to alcohol). Once missed work due to impairment, seeked care at local alcohol treatment center but they did not accept his insurance.  He and wife are interested in referral to local alcohol rehab program.  [facility]([place]).  I also continued to recommend involvement in local AA.  He has couples counseling planned with Bambi next month.

## 2017-11-09 ENCOUNTER — Telehealth (HOSPITAL_COMMUNITY): Payer: Self-pay | Admitting: Psychology

## 2017-12-05 ENCOUNTER — Ambulatory Visit: Payer: Self-pay | Admitting: Psychology

## 2018-01-09 ENCOUNTER — Other Ambulatory Visit: Payer: Self-pay | Admitting: Family Medicine

## 2018-01-09 NOTE — Telephone Encounter (Signed)
Naltrexone last filled:  12/04/17, #30 Depakote last filled:  11/16/17, #270 Last OV (CPE):  11/03/17 Next OV:  05/07/18

## 2018-01-19 NOTE — Telephone Encounter (Signed)
refilled 

## 2018-05-07 ENCOUNTER — Ambulatory Visit: Payer: 59 | Admitting: Family Medicine

## 2018-05-07 DIAGNOSIS — Z0289 Encounter for other administrative examinations: Secondary | ICD-10-CM

## 2018-07-06 ENCOUNTER — Other Ambulatory Visit: Payer: Self-pay | Admitting: Family Medicine

## 2018-07-06 NOTE — Telephone Encounter (Signed)
Will refill 1 BID

## 2018-07-06 NOTE — Telephone Encounter (Signed)
Not sure of which directions to fill for Depakote rx.  This request says 1 tab BID but rx in med list says 1 tab AM then 2 tabs PM.

## 2018-08-13 ENCOUNTER — Other Ambulatory Visit: Payer: Self-pay | Admitting: Family Medicine

## 2018-12-04 ENCOUNTER — Telehealth: Payer: Self-pay | Admitting: Family Medicine

## 2018-12-05 NOTE — Telephone Encounter (Signed)
E-scribed refill.  Please schedule annual CPE. 

## 2018-12-06 NOTE — Telephone Encounter (Signed)
Noted  

## 2018-12-06 NOTE — Telephone Encounter (Signed)
Pt already has cpx scheduled 3/18 labs 3/25 cpx

## 2019-01-30 ENCOUNTER — Other Ambulatory Visit: Payer: Self-pay | Admitting: Family Medicine

## 2019-01-30 NOTE — Telephone Encounter (Signed)
Naltrexone last filled:  12/07/18, #90 Depakote last filled:  01/07/19, #180 Last OV:  11/03/17, CPE Next OV:  02/13/19, CPE

## 2019-01-31 ENCOUNTER — Encounter (INDEPENDENT_AMBULATORY_CARE_PROVIDER_SITE_OTHER): Payer: Self-pay | Admitting: Orthopaedic Surgery

## 2019-01-31 ENCOUNTER — Ambulatory Visit (INDEPENDENT_AMBULATORY_CARE_PROVIDER_SITE_OTHER): Payer: 59

## 2019-01-31 ENCOUNTER — Other Ambulatory Visit: Payer: Self-pay

## 2019-01-31 ENCOUNTER — Ambulatory Visit (INDEPENDENT_AMBULATORY_CARE_PROVIDER_SITE_OTHER): Payer: 59 | Admitting: Orthopaedic Surgery

## 2019-01-31 VITALS — BP 121/66 | HR 74 | Ht 70.5 in | Wt 234.0 lb

## 2019-01-31 DIAGNOSIS — M25561 Pain in right knee: Secondary | ICD-10-CM | POA: Diagnosis not present

## 2019-01-31 DIAGNOSIS — M1711 Unilateral primary osteoarthritis, right knee: Secondary | ICD-10-CM

## 2019-01-31 DIAGNOSIS — Z6830 Body mass index (BMI) 30.0-30.9, adult: Secondary | ICD-10-CM | POA: Diagnosis not present

## 2019-01-31 DIAGNOSIS — E6609 Other obesity due to excess calories: Secondary | ICD-10-CM

## 2019-01-31 DIAGNOSIS — F17200 Nicotine dependence, unspecified, uncomplicated: Secondary | ICD-10-CM

## 2019-01-31 MED ORDER — LIDOCAINE HCL 1 % IJ SOLN
2.0000 mL | INTRAMUSCULAR | Status: AC | PRN
  Administered 2019-01-31: 2 mL

## 2019-01-31 MED ORDER — METHYLPREDNISOLONE ACETATE 40 MG/ML IJ SUSP
80.0000 mg | INTRAMUSCULAR | Status: AC | PRN
  Administered 2019-01-31: 80 mg via INTRA_ARTICULAR

## 2019-01-31 MED ORDER — DICLOFENAC SODIUM 1 % TD GEL: 4.0000 g | Freq: Four times a day (QID) | TRANSDERMAL | 3 refills | Status: DC

## 2019-01-31 MED ORDER — BUPIVACAINE HCL 0.25 % IJ SOLN
2.0000 mL | INTRAMUSCULAR | Status: AC | PRN
  Administered 2019-01-31: 2 mL via INTRA_ARTICULAR

## 2019-01-31 NOTE — Progress Notes (Signed)
Office Visit Note   Patient: Danny [name]           Date of Birth: Aug 21, 1971           MRN: [id number] Visit Date: 01/31/2019              Requested by: Danny Boyden, MD [address] [place], [place] [address] PCP: Danny Boyden, MD   Assessment & Plan: Visit Diagnoses:  1. Acute pain of right knee   2. Unilateral primary osteoarthritis, right knee     Plan: #1: Corticosteroid injection to the right knee was accomplished atraumatically. #2: Follow back up as needed. #3: Prescription for Voltaren gel was given.  Follow-Up Instructions: Return if symptoms worsen or fail to improve.   Orders:  Orders Placed This Encounter  Procedures  . Large Joint Inj: L knee  . XR KNEE 3 VIEW RIGHT   Meds ordered this encounter  Medications  . diclofenac sodium (VOLTAREN) 1 % GEL    Sig: Apply 4 g topically 4 (four) times daily.    Dispense:  4 Tube    Refill:  3      Procedures: Large Joint Inj: R knee on 01/31/2019 11:42 AM Indications: pain and diagnostic evaluation Details: 25 G 1.5 in needle, anteromedial approach  Arthrogram: No  Medications: 2 mL lidocaine 1 %; 80 mg methylPREDNISolone acetate 40 MG/ML; 2 mL bupivacaine 0.25 % Outcome: tolerated well, no immediate complications Procedure, treatment alternatives, risks and benefits explained, specific risks discussed. Consent was given by the patient. Immediately prior to procedure a time out was called to verify the correct patient, procedure, equipment, support staff and site/side marked as required. Patient was prepped and draped in the usual sterile fashion.       Clinical Data: No additional findings.   Subjective: Chief Complaint  Patient presents with  . Right Knee - Pain  Patient presents today with right knee pain X3 weeks. He said that it hurts all throughout. He said that the pain is like a toothache when resting, but gets worse with weightbearing. He has some swelling. He has taken  tramadol, but did not get much relief from that.  He has a history of ACL surgery with Dr.Collins years ago.   HPI  Danny [name] is a 48 year old white male who is seen today for evaluation of his right knee.  He has had problems with the knee for some time now noting a pain pretty much like a tooth ache especially when he is resting.  It certainly gets worse as he is upright and weightbearing.  He does complain of some swelling.  He is actually taking tramadol which has not given him much relief.  Has had an ACL reconstruction by Dr. Thomasena [name] many years ago.  Denies much in the way of any instability at this time. Seen today for evaluation.  Review of Systems  Constitutional: Negative for fatigue.  HENT: Negative for trouble swallowing.   Eyes: Negative for pain.  Respiratory: Negative for shortness of breath.   Cardiovascular: Negative for leg swelling.  Gastrointestinal: Negative for constipation.  Endocrine: Negative for heat intolerance.  Genitourinary: Negative for difficulty urinating.  Musculoskeletal: Positive for gait problem and joint swelling.  Allergic/Immunologic: Negative for immunocompromised state.  Neurological: Positive for weakness.  Hematological: Does not bruise/bleed easily.  Psychiatric/Behavioral: Negative for sleep disturbance.     Objective: Vital Signs: BP 121/66   Pulse 74   Ht 5' 10.5" (1.791 m)   Wt 234  lb (106.1 kg)   BMI 33.10 kg/m   Physical Exam Constitutional:      Appearance: He is well-developed.  Eyes:     Pupils: Pupils are equal, round, and reactive to light.  Pulmonary:     Effort: Pulmonary effort is normal.  Skin:    General: Skin is warm and dry.  Neurological:     Mental Status: He is alert and oriented to person, place, and time.  Psychiatric:        Behavior: Behavior normal.     Ortho Exam  Exam today reveals range of motion lacking a few degrees of full extension to about 105 degrees of flexion.  He does have patellofemoral  crepitance with range of motion.  Trace to mild effusion.  No warmth or erythema.  ACL is very tight.  Some tenderness over the medial lateral joint lines.  Neurovascular intact distally.  Supple nontender.  Specialty Comments:  No specialty comments available.  Imaging: Xr Knee 3 View Right  Result Date: 01/31/2019 Three-view x-ray of the right knee reveals tricompartment degenerative changes.  Worse at the patellofemoral with periarticular spurring.  Cystic changes noted in the posterior aspect of the distal femur on the lateral.    PMFS History: Current Outpatient Medications  Medication Sig Dispense Refill  . citalopram (CELEXA) 10 MG tablet TAKE 1 TABLET BY MOUTH 2 TIMES DAILY. 180 tablet 0  . divalproex (DEPAKOTE) 500 MG DR tablet TAKE 1 TABLET BY MOUTH 2 TIMES DAILY. 180 tablet 1  . guaiFENesin-codeine (CHERATUSSIN AC) 100-10 MG/5ML syrup Take 5 mLs by mouth at bedtime as needed for cough. 120 mL 0  . hydrOXYzine (ATARAX/VISTARIL) 25 MG tablet Take 0.5-1 tablets (12.5-25 mg total) by mouth 2 (two) times daily as needed. 30 tablet 0  . naltrexone (DEPADE) 50 MG tablet TAKE 1 TABLET BY MOUTH DAILY. 90 tablet 1  . diclofenac sodium (VOLTAREN) 1 % GEL Apply 4 g topically 4 (four) times daily. 4 Tube 3   No current facility-administered medications for this visit.     Patient Active Problem List   Diagnosis Date Noted  . Cough 11/04/2017  . Neuropathy 02/06/2017  . Hypertriglyceridemia 10/08/2016  . Alcohol abuse 07/15/2016  . Obesity, Class I, BMI 30-34.9 10/12/2015  . Health maintenance examination 10/12/2015  . OCD (obsessive compulsive disorder) 08/06/2015  . Chronic congestion of paranasal sinus 08/06/2015  . Bipolar 2 disorder [facility](HCC)    Past Medical History:  Diagnosis Date  . Anxiety   . Anxiety and depression    states controlled with meds  . Headache(784.0)    sinus headaches  . Impaired glucose tolerance    denies diabetes- states glucose elevated x 1 in past   . Multiple allergies    seansonal,environmental  . Perianal fistula, posterior midline 02/06/2012  . Pilonidal cyst     Family History  Problem Relation Age of Onset  . Cancer Father [id number]       prostate  . Anxiety disorder Father   . Depression Mother   . Heart disease Maternal Grandfather   . Cancer Paternal Grandfather        lung  . Colon cancer Neg Hx     Past Surgical History:  Procedure Laterality Date  . ANAL FISTULECTOMY  03/08/12   EUA  . ANTERIOR CRUCIATE LIGAMENT REPAIR  2008   right  . SHOULDER ARTHROSCOPY WITH ROTATOR CUFF REPAIR AND SUBACROMIAL DECOMPRESSION Right 12/10/2013   Procedure: SHOULDER ARTHROSCOPY WITH SUBACROMIAL DECOMPRESSION, DISTAL CALVICLE RESECTION,  OPEN ROTATOR CUFF REPAIR.;  Surgeon: Valeria Batman, MD;  Location: [facility];  Service: Orthopedics;  Laterality: Right;  Marland Kitchen VASECTOMY  2000  . WISDOM TOOTH EXTRACTION     right  lower   Social History   Occupational History  . Not on file  Tobacco Use  . Smoking status: Former Smoker    Last attempt to quit: 11/21/2009    Years since quitting: 9.2  . Smokeless tobacco: Current User    Types: Chew  Substance and Sexual Activity  . Alcohol use: No    Alcohol/week: 0.0 standard drinks    Comment: 12 pk week--Quit Dec 2017  . Drug use: No  . Sexual activity: Not on file

## 2019-02-01 NOTE — Telephone Encounter (Signed)
Sent in

## 2019-02-03 ENCOUNTER — Other Ambulatory Visit: Payer: Self-pay | Admitting: Family Medicine

## 2019-02-03 DIAGNOSIS — E781 Pure hyperglyceridemia: Secondary | ICD-10-CM

## 2019-02-03 DIAGNOSIS — F3181 Bipolar II disorder: Secondary | ICD-10-CM

## 2019-02-03 DIAGNOSIS — Z8042 Family history of malignant neoplasm of prostate: Secondary | ICD-10-CM | POA: Insufficient documentation

## 2019-02-04 ENCOUNTER — Telehealth (INDEPENDENT_AMBULATORY_CARE_PROVIDER_SITE_OTHER): Payer: Self-pay | Admitting: *Deleted

## 2019-02-04 ENCOUNTER — Other Ambulatory Visit (INDEPENDENT_AMBULATORY_CARE_PROVIDER_SITE_OTHER): Payer: Self-pay | Admitting: Orthopedic Surgery

## 2019-02-04 NOTE — Telephone Encounter (Signed)
Needs to have Medical Doctor because of drug interactions with his present meds.  Could use voltaren Gel...  Will call that in

## 2019-02-04 NOTE — Telephone Encounter (Signed)
Patient called, having right knee pain, taking Aleve which is not helping, apply for visco, patient requesting something for pain to be send to [facility]. Thank you.

## 2019-02-04 NOTE — Telephone Encounter (Signed)
Please apply for visco for right knee for Dr. Cleophas Dunker. Thank you.

## 2019-02-05 NOTE — Telephone Encounter (Signed)
Please call patient and advise. Thank you.

## 2019-02-05 NOTE — Telephone Encounter (Signed)
Tried to call patient. No answer. Left message on machine relaying the information below.

## 2019-02-06 ENCOUNTER — Other Ambulatory Visit (INDEPENDENT_AMBULATORY_CARE_PROVIDER_SITE_OTHER): Payer: 59

## 2019-02-06 ENCOUNTER — Telehealth: Payer: Self-pay

## 2019-02-06 ENCOUNTER — Other Ambulatory Visit: Payer: Self-pay

## 2019-02-06 DIAGNOSIS — Z8042 Family history of malignant neoplasm of prostate: Secondary | ICD-10-CM | POA: Diagnosis not present

## 2019-02-06 DIAGNOSIS — E781 Pure hyperglyceridemia: Secondary | ICD-10-CM

## 2019-02-06 DIAGNOSIS — F3181 Bipolar II disorder: Secondary | ICD-10-CM

## 2019-02-06 LAB — LIPID PANEL
Cholesterol: 194 mg/dL (ref 0–200)
HDL: 40.8 mg/dL (ref >39.00)
LDL Cholesterol: 129 mg/dL — ABNORMAL HIGH (ref 0–99)
NonHDL: 153.28
Total CHOL/HDL Ratio: 5
Triglycerides: 123 mg/dL (ref 0.0–149.0)
VLDL: 24.6 mg/dL (ref 0.0–40.0)

## 2019-02-06 LAB — COMPREHENSIVE METABOLIC PANEL
ALBUMIN: 4.1 g/dL (ref 3.5–5.2)
ALK PHOS: 63 U/L (ref 39–117)
ALT: 13 U/L (ref 0–53)
AST: 11 U/L (ref 0–37)
BILIRUBIN TOTAL: 0.5 mg/dL (ref 0.2–1.2)
BUN: 15 mg/dL (ref 6–23)
CO2: 32 mEq/L (ref 19–32)
Calcium: 9.2 mg/dL (ref 8.4–10.5)
Chloride: 103 mEq/L (ref 96–112)
Creatinine, Ser: 0.7 mg/dL (ref 0.40–1.50)
GFR: 120.55 mL/min (ref >60.00)
Glucose, Bld: 95 mg/dL (ref 70–99)
Potassium: 4.6 mEq/L (ref 3.5–5.1)
Sodium: 140 mEq/L (ref 135–145)
TOTAL PROTEIN: 6.3 g/dL (ref 6.0–8.3)

## 2019-02-06 LAB — CBC WITH DIFFERENTIAL/PLATELET
Basophils Absolute: 0 10*3/uL (ref 0.0–0.1)
Basophils Relative: 1 % (ref 0.0–3.0)
EOS PCT: 3.7 % (ref 0.0–5.0)
Eosinophils Absolute: 0.2 10*3/uL (ref 0.0–0.7)
HEMATOCRIT: 46.7 % (ref 39.0–52.0)
Hemoglobin: 16 g/dL (ref 13.0–17.0)
Lymphocytes Relative: 36.2 % (ref 12.0–46.0)
Lymphs Abs: 1.6 10*3/uL (ref 0.7–4.0)
MCHC: 34.4 g/dL (ref 30.0–36.0)
MCV: 92.5 fl (ref 78.0–100.0)
Monocytes Absolute: 0.5 10*3/uL (ref 0.1–1.0)
Monocytes Relative: 12.5 % — ABNORMAL HIGH (ref 3.0–12.0)
Neutro Abs: 2 10*3/uL (ref 1.4–7.7)
Neutrophils Relative %: 46.6 % (ref 43.0–77.0)
Platelets: 216 10*3/uL (ref 150.0–400.0)
RBC: 5.05 Mil/uL (ref 4.22–5.81)
RDW: 12.8 % (ref 11.5–15.5)
WBC: 4.4 10*3/uL (ref 4.0–10.5)

## 2019-02-06 LAB — PSA: PSA: 0.53 ng/mL (ref 0.10–4.00)

## 2019-02-06 NOTE — Telephone Encounter (Signed)
Pt would like a call back asap Best number [phone]

## 2019-02-06 NOTE — Telephone Encounter (Signed)
Probably best to stay off naltrexone while he's taking tramadol. Naltrexone is an opioid antidote. He should also be off tramadol for 1 week prior to restarting naltrexone.

## 2019-02-06 NOTE — Telephone Encounter (Signed)
Spoke with pt relaying Dr. G's message. Pt verbalizes understanding.  

## 2019-02-06 NOTE — Telephone Encounter (Signed)
Pt left v/m; pt is seeing ortho and before ortho would give pt tramadol for pain wanted pt to call [facility] to verify pt could take tramadol with present medications. Pt request cb.

## 2019-02-07 NOTE — Telephone Encounter (Signed)
Noted  

## 2019-02-08 ENCOUNTER — Telehealth (INDEPENDENT_AMBULATORY_CARE_PROVIDER_SITE_OTHER): Payer: Self-pay

## 2019-02-08 NOTE — Telephone Encounter (Signed)
Submitted VOB for Euflexxa, right knee. 

## 2019-02-11 ENCOUNTER — Telehealth: Payer: Self-pay | Admitting: *Deleted

## 2019-02-11 MED ORDER — FAMOTIDINE 20 MG PO TABS: 20.0000 mg | ORAL_TABLET | Freq: Every day | ORAL | 3 refills | Status: DC

## 2019-02-11 MED ORDER — NAPROXEN 500 MG PO TABS: ORAL_TABLET | ORAL | 0 refills | Status: DC

## 2019-02-11 NOTE — Telephone Encounter (Signed)
Spoke with pt relaying message and instructions. Pt verbalizes understanding and expresses his thanks.

## 2019-02-11 NOTE — Telephone Encounter (Signed)
Let's try an anti inflammatory naprosyn twice daily with meals to prevent GI upset. I've sent this to pharmacy. Ok to continue taking naltrexone with this med.  He can also take topical diclofenac (voltaren) prescribed by ortho.  There is an interaction between celexa (citalopram) and naprosyn - may increase bleeding risk. Watch for nausea/vomiting or GI pain. May pepcid OTC 20mg  once daily with naprosyn to help protect stomach.

## 2019-02-11 NOTE — Telephone Encounter (Signed)
Patient left a voicemail stating that he saw an orthopedist and was given a cortisone shot in his knee. Patient stated that he is still having knee pain. Patient stated that he called the orthopedist to get something for the pain and was told that he would have to call his PCP for pain medication.  Patient stated that he needs a low strength pain medication like Tramadol or an antiinflammatory. Patient requested a call back.

## 2019-02-12 NOTE — Telephone Encounter (Signed)
Great - thanks

## 2019-02-12 NOTE — Telephone Encounter (Signed)
Pt stated he knee if feeling much and THANK YOU!!

## 2019-02-13 ENCOUNTER — Encounter: Payer: Self-pay | Admitting: Family Medicine

## 2019-02-14 ENCOUNTER — Ambulatory Visit: Payer: 59 | Admitting: Family Medicine

## 2019-02-22 ENCOUNTER — Telehealth (INDEPENDENT_AMBULATORY_CARE_PROVIDER_SITE_OTHER): Payer: Self-pay

## 2019-02-22 NOTE — Telephone Encounter (Signed)
Please check to see if Dr. Cleophas Dunker would like for patient to be scheduled for gel injection?  Thank You.   Approved for Euflexxa series, right knee. Buy & Bill Covered at 100% through [employer]. Patient may have a one time Co-pay of $50.00.  No PA required

## 2019-03-05 ENCOUNTER — Encounter (INDEPENDENT_AMBULATORY_CARE_PROVIDER_SITE_OTHER): Payer: Self-pay | Admitting: Orthopaedic Surgery

## 2019-03-05 ENCOUNTER — Other Ambulatory Visit: Payer: Self-pay

## 2019-03-05 ENCOUNTER — Ambulatory Visit (INDEPENDENT_AMBULATORY_CARE_PROVIDER_SITE_OTHER): Payer: 59 | Admitting: Orthopaedic Surgery

## 2019-03-05 VITALS — BP 130/74 | HR 73 | Resp 16 | Wt 223.0 lb

## 2019-03-05 DIAGNOSIS — M1711 Unilateral primary osteoarthritis, right knee: Secondary | ICD-10-CM | POA: Diagnosis not present

## 2019-03-05 MED ORDER — SODIUM HYALURONATE (VISCOSUP) 20 MG/2ML IX SOSY
20.0000 mg | PREFILLED_SYRINGE | INTRA_ARTICULAR | Status: AC | PRN
  Administered 2019-03-05: 11:00:00 20 mg via INTRA_ARTICULAR

## 2019-03-05 MED ORDER — LIDOCAINE HCL 1 % IJ SOLN
2.0000 mL | INTRAMUSCULAR | Status: AC | PRN
  Administered 2019-03-05: 2 mL

## 2019-03-05 NOTE — Telephone Encounter (Signed)
Please schedule appt w/Dr. Cleophas Dunker if patient is healthy. Thank you.

## 2019-03-05 NOTE — Progress Notes (Signed)
Office Visit Note   Patient: Danny [name]           Date of Birth: September 13, 1971           MRN: [id number] Visit Date: 03/05/2019              Requested by: Eustaquio Boyden, MD [address] [place], [place] [address] PCP: Eustaquio Boyden, MD   Assessment & Plan: Visit Diagnoses:  1. Unilateral primary osteoarthritis, right knee     Plan:  #1: First Euflexxa injection was given without difficulty.  Tolerated the procedure well. #2: Follow back up with [place] in 1 week for the second of 3 Euflexxa injections.  Follow-Up Instructions: Return in about 1 week (around 03/12/2019).   Orders:  No orders of the defined types were placed in this encounter.  No orders of the defined types were placed in this encounter.     Procedures: Large Joint Inj: R knee on 03/05/2019 11:10 AM Indications: pain and joint swelling Details: 25 G 1.5 in needle  Arthrogram: No  Medications: 2 mL lidocaine 1 %; 20 mg Sodium Hyaluronate 20 MG/2ML Outcome: tolerated well, no immediate complications Procedure, treatment alternatives, risks and benefits explained, specific risks discussed. Consent was given by the patient. Immediately prior to procedure a time out was called to verify the correct patient, procedure, equipment, support staff and site/side marked as required. Patient was prepped and draped in the usual sterile fashion.       Clinical Data: No additional findings.   Subjective: Chief Complaint  Patient presents with  . Right Knee - Pain    HPI  Danny [name] is a very pleasant 48 year old white male who presents today for Visco supplementation to his right knee.  I saw him back on 12 March at that time was evaluated and noted to have osteoarthritis.  He did have an ACL reconstruction by Dr. Thomasena Edis about 12 years ago.  He started out having pain in the knee much like a tooth ache.  He finds that that even when he is at rest.  It is gotten worse as he is weightbearing.  He does have  occasional swelling.  He does occasionally take tramadol which is not very effective.  Denies instability.  Corticosteroid injection was given at his last visit.  He had some improvement.  Today for evaluation.  Review of Systems  Constitutional: Negative for fatigue.  HENT: Negative for trouble swallowing.   Eyes: Negative for pain.  Respiratory: Negative for shortness of breath.   Cardiovascular: Negative for leg swelling.  Gastrointestinal: Negative for constipation.  Endocrine: Negative for heat intolerance.  Genitourinary: Negative for difficulty urinating.  Musculoskeletal: Positive for gait problem and joint swelling.  Allergic/Immunologic: Negative for immunocompromised state.  Neurological: Positive for weakness.  Hematological: Does not bruise/bleed easily.  Psychiatric/Behavioral: Negative for sleep disturbance.     Objective: Vital Signs: BP 130/74 (BP Location: Left Arm, Patient Position: Sitting, Cuff Size: Normal)   Pulse 73   Resp 16   Wt 223 lb (101.2 kg)   BMI 31.54 kg/m   Physical Exam Constitutional:      Appearance: He is well-developed.  Eyes:     Pupils: Pupils are equal, round, and reactive to light.  Pulmonary:     Effort: Pulmonary effort is normal.  Skin:    General: Skin is warm and dry.  Neurological:     Mental Status: He is alert and oriented to person, place, and time.  Psychiatric:  Behavior: Behavior normal.     Ortho Exam  Exam today reveals the need to be without warmth or erythema.  He has a trace effusion.  Range of motion from near full extension to 105 degrees.  Femoral crepitance.  Negative drawer.  Medial lateral joint line pain to palpation neuro vas intact distally.  Calf supple nontender.  Specialty Comments:  No specialty comments available.  Imaging: No results found.   PMFS History: Current Outpatient Medications  Medication Sig Dispense Refill  . citalopram (CELEXA) 10 MG tablet TAKE 1 TABLET BY MOUTH 2  TIMES DAILY. 180 tablet 0  . diclofenac sodium (VOLTAREN) 1 % GEL Apply 4 g topically 4 (four) times daily. 4 Tube 3  . divalproex (DEPAKOTE) 500 MG DR tablet TAKE 1 TABLET BY MOUTH 2 TIMES DAILY. 180 tablet 0  . famotidine (PEPCID) 20 MG tablet Take 1 tablet (20 mg total) by mouth at bedtime. 30 tablet 3  . hydrOXYzine (ATARAX/VISTARIL) 25 MG tablet Take 0.5-1 tablets (12.5-25 mg total) by mouth 2 (two) times daily as needed. 30 tablet 0  . naltrexone (DEPADE) 50 MG tablet TAKE 1 TABLET BY MOUTH DAILY. 90 tablet 0  . naproxen (NAPROSYN) 500 MG tablet Take one po bid x 1 week then prn pain, take with food 50 tablet 0   No current facility-administered medications for this visit.     Patient Active Problem List   Diagnosis Date Noted  . Family history of prostate cancer in father 02/03/2019  . Cough 11/04/2017  . Neuropathy 02/06/2017  . Hypertriglyceridemia 10/08/2016  . Alcohol abuse 07/15/2016  . Obesity, Class I, BMI 30-34.9 10/12/2015  . Health maintenance examination 10/12/2015  . OCD (obsessive compulsive disorder) 08/06/2015  . Chronic congestion of paranasal sinus 08/06/2015  . Bipolar 2 disorder [facility](HCC)    Past Medical History:  Diagnosis Date  . Anxiety   . Anxiety and depression    states controlled with meds  . Headache(784.0)    sinus headaches  . Impaired glucose tolerance    denies diabetes- states glucose elevated x 1 in past  . Multiple allergies    seansonal,environmental  . Perianal fistula, posterior midline 02/06/2012  . Pilonidal cyst     Family History  Problem Relation Age of Onset  . Cancer Father [phone]       prostate  . Anxiety disorder Father   . Depression Mother   . Heart disease Maternal Grandfather   . Cancer Paternal Grandfather        lung  . Colon cancer Neg Hx     Past Surgical History:  Procedure Laterality Date  . ANAL FISTULECTOMY  03/08/12   EUA  . ANTERIOR CRUCIATE LIGAMENT REPAIR  2008   right  . SHOULDER ARTHROSCOPY WITH  ROTATOR CUFF REPAIR AND SUBACROMIAL DECOMPRESSION Right 12/10/2013   Procedure: SHOULDER ARTHROSCOPY WITH SUBACROMIAL DECOMPRESSION, DISTAL CALVICLE RESECTION, OPEN ROTATOR CUFF REPAIR.;  Surgeon: Valeria BatmanPeter W Whitfield, MD;  Location: [facility];  Service: Orthopedics;  Laterality: Right;  Marland Kitchen. VASECTOMY  2000  . WISDOM TOOTH EXTRACTION     right  lower   Social History   Occupational History  . Not on file  Tobacco Use  . Smoking status: Former Smoker    Last attempt to quit: 11/21/2009    Years since quitting: 9.2  . Smokeless tobacco: Current User    Types: Chew  Substance and Sexual Activity  . Alcohol use: No    Alcohol/week: 0.0 standard drinks  Comment: 12 pk week--Quit Dec 2017  . Drug use: No  . Sexual activity: Not on file

## 2019-03-09 ENCOUNTER — Other Ambulatory Visit: Payer: Self-pay | Admitting: Family Medicine

## 2019-03-11 ENCOUNTER — Other Ambulatory Visit: Payer: Self-pay | Admitting: Family Medicine

## 2019-03-12 ENCOUNTER — Ambulatory Visit (INDEPENDENT_AMBULATORY_CARE_PROVIDER_SITE_OTHER): Payer: 59 | Admitting: Orthopaedic Surgery

## 2019-03-12 ENCOUNTER — Other Ambulatory Visit: Payer: Self-pay

## 2019-03-12 ENCOUNTER — Encounter (INDEPENDENT_AMBULATORY_CARE_PROVIDER_SITE_OTHER): Payer: Self-pay | Admitting: Orthopaedic Surgery

## 2019-03-12 VITALS — BP 130/73 | HR 69 | Ht 70.5 in | Wt 223.0 lb

## 2019-03-12 DIAGNOSIS — M1711 Unilateral primary osteoarthritis, right knee: Secondary | ICD-10-CM | POA: Diagnosis not present

## 2019-03-12 MED ORDER — SODIUM HYALURONATE (VISCOSUP) 20 MG/2ML IX SOSY
20.0000 mg | PREFILLED_SYRINGE | INTRA_ARTICULAR | Status: AC | PRN
  Administered 2019-03-12: 10:00:00 20 mg via INTRA_ARTICULAR

## 2019-03-12 MED ORDER — LIDOCAINE HCL 1 % IJ SOLN
2.0000 mL | INTRAMUSCULAR | Status: AC | PRN
  Administered 2019-03-12: 2 mL

## 2019-03-12 NOTE — Telephone Encounter (Signed)
Called to schedule pt for virtual med refill appt. Lvm asking him to call office.

## 2019-03-12 NOTE — Progress Notes (Signed)
Office Visit Note   Patient: Danny [name]           Date of Birth: 04-28-1971           MRN: [id number] Visit Date: 03/12/2019              Requested by: Eustaquio Boyden, MD [address] [place], [place] [address] PCP: Eustaquio Boyden, MD   Assessment & Plan: Visit Diagnoses:  1. Unilateral primary osteoarthritis, right knee     Plan:  #1: Second Euflexxa was given without difficulty.  Tolerated procedure well. #2: Follow back up in 1 week for his third Euflexxa action.  Follow-Up Instructions: No follow-ups on file.   Orders:  No orders of the defined types were placed in this encounter.  No orders of the defined types were placed in this encounter.     Procedures: Large Joint Inj: R knee on 03/12/2019 9:34 AM Indications: pain and joint swelling Details: 25 G 1.5 in needle  Arthrogram: No  Medications: 2 mL lidocaine 1 %; 20 mg Sodium Hyaluronate 20 MG/2ML Outcome: tolerated well, no immediate complications Procedure, treatment alternatives, risks and benefits explained, specific risks discussed. Consent was given by the patient. Immediately prior to procedure a time out was called to verify the correct patient, procedure, equipment, support staff and site/side marked as required. Patient was prepped and draped in the usual sterile fashion.       Clinical Data: No additional findings.   Subjective: Chief Complaint  Patient presents with  . Right Knee - Follow-up    Euflexxa started 03/05/19  Patient presents today for the second Euflexxa injection on the right side. He started the injections on 03/05/19. Patient states that his knee is feeling a little better.   HPI  Review of Systems  Constitutional: Negative for fatigue.  HENT: Negative for trouble swallowing.   Eyes: Negative for pain.  Respiratory: Negative for shortness of breath.   Cardiovascular: Negative for leg swelling.  Gastrointestinal: Negative for constipation.  Endocrine:  Negative for heat intolerance.  Genitourinary: Negative for difficulty urinating.  Musculoskeletal: Positive for gait problem and joint swelling.  Allergic/Immunologic: Negative for immunocompromised state.  Neurological: Positive for weakness.  Hematological: Does not bruise/bleed easily.  Psychiatric/Behavioral: Negative for sleep disturbance.     Objective: Vital Signs: BP 130/73   Pulse 69   Ht 5' 10.5" (1.791 m)   Wt 223 lb (101.2 kg)   BMI 31.54 kg/m   Physical Exam Constitutional:      Appearance: He is well-developed.  Eyes:     Pupils: Pupils are equal, round, and reactive to light.  Pulmonary:     Effort: Pulmonary effort is normal.  Skin:    General: Skin is warm and dry.  Neurological:     Mental Status: He is alert and oriented to person, place, and time.  Psychiatric:        Behavior: Behavior normal.     Ortho Exam  Exam today reveals no reactivity.  No effusion.  Specialty Comments:  No specialty comments available.  Imaging: No results found.   PMFS History: Patient Active Problem List   Diagnosis Date Noted  . Family history of prostate cancer in father 02/03/2019  . Cough 11/04/2017  . Neuropathy 02/06/2017  . Hypertriglyceridemia 10/08/2016  . Alcohol abuse 07/15/2016  . Obesity, Class I, BMI 30-34.9 10/12/2015  . Health maintenance examination 10/12/2015  . OCD (obsessive compulsive disorder) 08/06/2015  . Chronic congestion of paranasal sinus  08/06/2015  . Bipolar 2 disorder [facility](HCC)    Past Medical History:  Diagnosis Date  . Anxiety   . Anxiety and depression    states controlled with meds  . Headache(784.0)    sinus headaches  . Impaired glucose tolerance    denies diabetes- states glucose elevated x 1 in past  . Multiple allergies    seansonal,environmental  . Perianal fistula, posterior midline 02/06/2012  . Pilonidal cyst     Family History  Problem Relation Age of Onset  . Cancer Father [phone]       prostate  . Anxiety  disorder Father   . Depression Mother   . Heart disease Maternal Grandfather   . Cancer Paternal Grandfather        lung  . Colon cancer Neg Hx     Past Surgical History:  Procedure Laterality Date  . ANAL FISTULECTOMY  03/08/12   EUA  . ANTERIOR CRUCIATE LIGAMENT REPAIR  2008   right  . SHOULDER ARTHROSCOPY WITH ROTATOR CUFF REPAIR AND SUBACROMIAL DECOMPRESSION Right 12/10/2013   Procedure: SHOULDER ARTHROSCOPY WITH SUBACROMIAL DECOMPRESSION, DISTAL CALVICLE RESECTION, OPEN ROTATOR CUFF REPAIR.;  Surgeon: Valeria BatmanPeter W Whitfield, MD;  Location: [facility];  Service: Orthopedics;  Laterality: Right;  Marland Kitchen. VASECTOMY  2000  . WISDOM TOOTH EXTRACTION     right  lower   Social History   Occupational History  . Not on file  Tobacco Use  . Smoking status: Former Smoker    Last attempt to quit: 11/21/2009    Years since quitting: 9.3  . Smokeless tobacco: Current User    Types: Chew  Substance and Sexual Activity  . Alcohol use: No    Alcohol/week: 0.0 standard drinks    Comment: 12 pk week--Quit Dec 2017  . Drug use: No  . Sexual activity: Not on file

## 2019-03-12 NOTE — Telephone Encounter (Signed)
Needs med refill appt and CPE

## 2019-03-12 NOTE — Telephone Encounter (Signed)
CPE scheduled 05/14/2019 with Dr. Reece Agar.

## 2019-03-12 NOTE — Telephone Encounter (Signed)
Last office visit 11/03/2017.  Last refilled 02/11/2019 for #50 with no refills.  CPE scheduled for 05/14/2019.  Ok to refill?

## 2019-03-19 ENCOUNTER — Other Ambulatory Visit: Payer: Self-pay

## 2019-03-19 ENCOUNTER — Ambulatory Visit (INDEPENDENT_AMBULATORY_CARE_PROVIDER_SITE_OTHER): Payer: 59 | Admitting: Orthopaedic Surgery

## 2019-03-19 ENCOUNTER — Encounter (INDEPENDENT_AMBULATORY_CARE_PROVIDER_SITE_OTHER): Payer: Self-pay | Admitting: Orthopaedic Surgery

## 2019-03-19 VITALS — BP 123/75 | HR 67 | Ht 70.5 in | Wt 223.0 lb

## 2019-03-19 DIAGNOSIS — M1711 Unilateral primary osteoarthritis, right knee: Secondary | ICD-10-CM | POA: Diagnosis not present

## 2019-03-19 MED ORDER — SODIUM HYALURONATE (VISCOSUP) 20 MG/2ML IX SOSY
20.0000 mg | PREFILLED_SYRINGE | INTRA_ARTICULAR | Status: AC | PRN
  Administered 2019-03-19: 20 mg via INTRA_ARTICULAR

## 2019-03-19 MED ORDER — LIDOCAINE HCL 1 % IJ SOLN
2.0000 mL | INTRAMUSCULAR | Status: AC | PRN
  Administered 2019-03-19: 2 mL

## 2019-03-19 NOTE — Progress Notes (Signed)
Office Visit Note   Patient: Danny [name]           Date of Birth: 11-20-1971           MRN: [id number] Visit Date: 03/19/2019              Requested by: Eustaquio Boyden, MD [address] [place], [place] [address] PCP: Eustaquio Boyden, MD   Assessment & Plan: Visit Diagnoses:  1. Unilateral primary osteoarthritis, right knee     Plan: #1: Third Euflexxa injection was given without difficulty.  Tolerated procedure well. #2: Follow back as needed.   Follow-Up Instructions: Return if symptoms worsen or fail to improve.   Orders:  No orders of the defined types were placed in this encounter.  No orders of the defined types were placed in this encounter.     Procedures: Large Joint Inj: R knee on 03/19/2019 10:15 AM Indications: pain and joint swelling Details: 25 G 1.5 in needle, anteromedial approach  Arthrogram: No  Medications: 2 mL lidocaine 1 %; 20 mg Sodium Hyaluronate 20 MG/2ML Outcome: tolerated well, no immediate complications Procedure, treatment alternatives, risks and benefits explained, specific risks discussed. Consent was given by the patient. Immediately prior to procedure a time out was called to verify the correct patient, procedure, equipment, support staff and site/side marked as required. Patient was prepped and draped in the usual sterile fashion.       Clinical Data: No additional findings.   Subjective: Chief Complaint  Patient presents with  . Right Knee - Follow-up    Euflexxa injections started 03/05/19  Patient presents today for the third injection of Euflexxa on the right side. He started the injections on 03/05/19. Patient states that he has noticed improvement in his knee.   HPI  Review of Systems  Constitutional: Negative for fatigue.  HENT: Negative for trouble swallowing.   Eyes: Negative for pain.  Respiratory: Negative for shortness of breath.   Cardiovascular: Negative for leg swelling.  Gastrointestinal:  Negative for constipation.  Endocrine: Negative for heat intolerance.  Genitourinary: Negative for difficulty urinating.  Musculoskeletal: Positive for gait problem and joint swelling.  Allergic/Immunologic: Negative for immunocompromised state.  Neurological: Positive for weakness.  Hematological: Does not bruise/bleed easily.  Psychiatric/Behavioral: Negative for sleep disturbance.     Objective: Vital Signs: BP 123/75   Pulse 67   Ht 5' 10.5" (1.791 m)   Wt 223 lb (101.2 kg)   BMI 31.54 kg/m   Physical Exam Constitutional:      Appearance: He is well-developed.  Eyes:     Pupils: Pupils are equal, round, and reactive to light.  Pulmonary:     Effort: Pulmonary effort is normal.  Skin:    General: Skin is warm and dry.  Neurological:     Mental Status: He is alert and oriented to person, place, and time.  Psychiatric:        Behavior: Behavior normal.     Ortho Exam  His knee is quite benign at this time.  No reactivity or warmth or erythema.  Good motion without pain.  Specialty Comments:  No specialty comments available.  Imaging: No results found.   PMFS History: Current Outpatient Medications  Medication Sig Dispense Refill  . citalopram (CELEXA) 10 MG tablet Take 1 tablet (10 mg total) by mouth 2 (two) times a day. 180 tablet 0  . diclofenac sodium (VOLTAREN) 1 % GEL Apply 4 g topically 4 (four) times daily. 4 Tube 3  .  divalproex (DEPAKOTE) 500 MG DR tablet TAKE 1 TABLET BY MOUTH 2 TIMES DAILY. 180 tablet 0  . famotidine (PEPCID) 20 MG tablet Take 1 tablet (20 mg total) by mouth at bedtime. 30 tablet 3  . hydrOXYzine (ATARAX/VISTARIL) 25 MG tablet Take 0.5-1 tablets (12.5-25 mg total) by mouth 2 (two) times daily as needed. 30 tablet 0  . naltrexone (DEPADE) 50 MG tablet TAKE 1 TABLET BY MOUTH DAILY. 90 tablet 0  . naproxen (NAPROSYN) 500 MG tablet TAKE 1 TABLET BY MOUTH TWICE A DAY FOR 1 WEEK THEN AS NEEDED FOR PAIN. TAKE WITH FOOD. 50 tablet 0   No  current facility-administered medications for this visit.     Patient Active Problem List   Diagnosis Date Noted  . Family history of prostate cancer in father 02/03/2019  . Cough 11/04/2017  . Neuropathy 02/06/2017  . Hypertriglyceridemia 10/08/2016  . Alcohol abuse 07/15/2016  . Obesity, Class I, BMI 30-34.9 10/12/2015  . Health maintenance examination 10/12/2015  . OCD (obsessive compulsive disorder) 08/06/2015  . Chronic congestion of paranasal sinus 08/06/2015  . Bipolar 2 disorder [facility](HCC)    Past Medical History:  Diagnosis Date  . Anxiety   . Anxiety and depression    states controlled with meds  . Headache(784.0)    sinus headaches  . Impaired glucose tolerance    denies diabetes- states glucose elevated x 1 in past  . Multiple allergies    seansonal,environmental  . Perianal fistula, posterior midline 02/06/2012  . Pilonidal cyst     Family History  Problem Relation Age of Onset  . Cancer Father 255       prostate  . Anxiety disorder Father   . Depression Mother   . Heart disease Maternal Grandfather   . Cancer Paternal Grandfather        lung  . Colon cancer Neg Hx     Past Surgical History:  Procedure Laterality Date  . ANAL FISTULECTOMY  03/08/12   EUA  . ANTERIOR CRUCIATE LIGAMENT REPAIR  2008   right  . SHOULDER ARTHROSCOPY WITH ROTATOR CUFF REPAIR AND SUBACROMIAL DECOMPRESSION Right 12/10/2013   Procedure: SHOULDER ARTHROSCOPY WITH SUBACROMIAL DECOMPRESSION, DISTAL CALVICLE RESECTION, OPEN ROTATOR CUFF REPAIR.;  Surgeon: Valeria BatmanPeter W Whitfield, MD;  Location: [facility];  Service: Orthopedics;  Laterality: Right;  Marland Kitchen. VASECTOMY  2000  . WISDOM TOOTH EXTRACTION     right  lower   Social History   Occupational History  . Not on file  Tobacco Use  . Smoking status: Former Smoker    Last attempt to quit: 11/21/2009    Years since quitting: 9.3  . Smokeless tobacco: Current User    Types: Chew  Substance and Sexual Activity  . Alcohol use: No     Alcohol/week: 0.0 standard drinks    Comment: 12 pk week--Quit Dec 2017  . Drug use: No  . Sexual activity: Not on file

## 2019-05-13 ENCOUNTER — Other Ambulatory Visit: Payer: Self-pay | Admitting: Family Medicine

## 2019-05-13 NOTE — Telephone Encounter (Signed)
Electronic refill request Depakote Last refill 02/01/19 #90 Last office visit 11/03/17

## 2019-05-14 ENCOUNTER — Encounter: Payer: Self-pay | Admitting: Family Medicine

## 2019-05-14 ENCOUNTER — Other Ambulatory Visit: Payer: Self-pay

## 2019-05-14 ENCOUNTER — Ambulatory Visit (INDEPENDENT_AMBULATORY_CARE_PROVIDER_SITE_OTHER): Payer: 59 | Admitting: Family Medicine

## 2019-05-14 VITALS — BP 124/72 | HR 84 | Temp 98.2°F | Ht 70.0 in | Wt 241.2 lb

## 2019-05-14 DIAGNOSIS — F3181 Bipolar II disorder: Secondary | ICD-10-CM | POA: Diagnosis not present

## 2019-05-14 DIAGNOSIS — F1010 Alcohol abuse, uncomplicated: Secondary | ICD-10-CM

## 2019-05-14 DIAGNOSIS — Z0000 Encounter for general adult medical examination without abnormal findings: Secondary | ICD-10-CM | POA: Diagnosis not present

## 2019-05-14 DIAGNOSIS — E781 Pure hyperglyceridemia: Secondary | ICD-10-CM

## 2019-05-14 DIAGNOSIS — E669 Obesity, unspecified: Secondary | ICD-10-CM

## 2019-05-14 DIAGNOSIS — Z8042 Family history of malignant neoplasm of prostate: Secondary | ICD-10-CM

## 2019-05-14 MED ORDER — DIVALPROEX SODIUM 500 MG PO DR TAB: 500.0000 mg | DELAYED_RELEASE_TABLET | Freq: Two times a day (BID) | ORAL | 3 refills | Status: DC

## 2019-05-14 MED ORDER — CITALOPRAM HYDROBROMIDE 10 MG PO TABS: 10.0000 mg | ORAL_TABLET | Freq: Two times a day (BID) | ORAL | 3 refills | Status: DC

## 2019-05-14 NOTE — Assessment & Plan Note (Signed)
Chronic, stable. Continue depakote/celexa.

## 2019-05-14 NOTE — Assessment & Plan Note (Signed)
Discussed yearly PSA and likely QOY DRE

## 2019-05-14 NOTE — Progress Notes (Signed)
This visit was conducted in person.  BP 124/72 (BP Location: Left Arm, Patient Position: Sitting, Cuff Size: Large)   Pulse 84   Temp 98.2 F (36.8 C) (Temporal)   Ht 5\' 10"  (1.778 m)   Wt 241 lb 3 oz (109.4 kg)   SpO2 96%   BMI 34.61 kg/m    CC: CPE Subjective:    Patient ID: Danny [name], male    DOB: December 30, 1970, 48 y.o.   MRN: [id number]  HPI: Danny [name] is a 48 y.o. male presenting on 05/14/2019 for Annual Exam   Last seen 10/2017.  R knee pain - completed physical therapy. Has received steroid injection by Dr Durward Fortes.  Naprosyn has helped a lot as well, uses PRN.   20 lb weight gain noted. Has more sedentary job.   Alcohol abuse - previously treated with naltrexone 50mg  daily with benefit, but with occasional relapses. previously we referred to [facility] outpatient alcohol treatment program Brandon Mel[facility]) - pt declined eval after he found out what it involved. Has had couples counseling with Bambi but not currently seeing. Minimal drinking.   Likely bipolar 2 - stable on depakote and celexa and hdyroxyzine PRN. Has declined psych eval.   Preventative: Prostate cancer screening - discussed. Fmhx prostate cancer (father age 12). Flu shot - declines Tdap 2010. He did have Tetanus shot at work. Will check at work.  Seat belt use discussed. Sunscreen use discussed. No changing moles on skin.  Ex smoker. Quit 2011 - some cigarettes here and there when drinking Alcohol abuse - has decreased use significantly Dentist q6 mo Eye exam overdue   From [place] Married, 1992 Lives with wife and 2 kids (daughter Debe Coder, son Wilfred Lacy)  Occ: Works for [employer] (contracting)  Activity: no regular exercise, walking some, yard work  Diet: some milk, good water, some fruits/vegetables      Relevant past medical, surgical, family and social history reviewed and updated as indicated. Interim medical history since our last visit reviewed.  Allergies and medications reviewed and updated. Outpatient Medications Prior to Visit  Medication Sig Dispense Refill  . diclofenac sodium (VOLTAREN) 1 % GEL Apply 4 g topically 4 (four) times daily. (Patient taking differently: Apply 4 g topically 4 (four) times daily. As needed) 4 Tube 3  . naproxen (NAPROSYN) 500 MG tablet TAKE 1 TABLET BY MOUTH TWICE A DAY FOR 1 WEEK THEN AS NEEDED FOR PAIN. TAKE WITH FOOD. 50 tablet 0  . citalopram (CELEXA) 10 MG tablet Take 1 tablet (10 mg total) by mouth 2 (two) times a day. 180 tablet 0  . divalproex (DEPAKOTE) 500 MG DR tablet TAKE 1 TABLET BY MOUTH 2 TIMES DAILY. 180 tablet 0  . famotidine (PEPCID) 20 MG tablet Take 1 tablet (20 mg total) by mouth at bedtime. (Patient not taking: Reported on 05/14/2019) 30 tablet 3  . naltrexone (DEPADE) 50 MG tablet TAKE 1 TABLET BY MOUTH DAILY. (Patient not taking: Reported on 05/14/2019) 90 tablet 0  . hydrOXYzine (ATARAX/VISTARIL) 25 MG tablet Take 0.5-1 tablets (12.5-25 mg total) by mouth 2 (two) times daily as needed. 30 tablet 0   No facility-administered medications prior to visit.      Per HPI unless specifically indicated in ROS section below Review of Systems  Constitutional: Negative for activity change, appetite change, chills, fatigue, fever and unexpected weight change.  HENT: Negative for hearing loss.   Eyes: Negative for visual disturbance.  Respiratory: Negative for cough, chest  tightness, shortness of breath and wheezing.   Cardiovascular: Negative for chest pain, palpitations and leg swelling.  Gastrointestinal: Negative for abdominal distention, abdominal pain, blood in stool, constipation, diarrhea, nausea and vomiting.  Genitourinary: Negative for difficulty urinating and hematuria.  Musculoskeletal: Negative for arthralgias, myalgias and neck pain.  Skin: Negative for rash.  Neurological: Negative for dizziness, seizures, syncope and headaches.  Hematological: Negative for adenopathy. Does  not bruise/bleed easily.  Psychiatric/Behavioral: Negative for dysphoric mood. The patient is not nervous/anxious.    Objective:    BP 124/72 (BP Location: Left Arm, Patient Position: Sitting, Cuff Size: Large)   Pulse 84   Temp 98.2 F (36.8 C) (Temporal)   Ht 5\' 10"  (1.778 m)   Wt 241 lb 3 oz (109.4 kg)   SpO2 96%   BMI 34.61 kg/m   Wt Readings from Last 3 Encounters:  05/14/19 241 lb 3 oz (109.4 kg)  03/19/19 223 lb (101.2 kg)  03/12/19 223 lb (101.2 kg)    Physical Exam Vitals signs and nursing note reviewed.  Constitutional:      General: He is not in acute distress.    Appearance: Normal appearance. He is well-developed. He is not ill-appearing.  HENT:     Head: Normocephalic and atraumatic.     Right Ear: Hearing, tympanic membrane, ear canal and external ear normal.     Left Ear: Hearing, tympanic membrane, ear canal and external ear normal.     Nose: Nose normal.     Mouth/Throat:     Mouth: Mucous membranes are moist.     Pharynx: Uvula midline. No oropharyngeal exudate or posterior oropharyngeal erythema.  Eyes:     General: No scleral icterus.    Conjunctiva/sclera: Conjunctivae normal.     Pupils: Pupils are equal, round, and reactive to light.  Neck:     Musculoskeletal: Normal range of motion and neck supple.  Cardiovascular:     Rate and Rhythm: Normal rate and regular rhythm.     Pulses: Normal pulses.          Radial pulses are 2+ on the right side and 2+ on the left side.     Heart sounds: Normal heart sounds. No murmur.  Pulmonary:     Effort: Pulmonary effort is normal. No respiratory distress.     Breath sounds: Normal breath sounds. No wheezing, rhonchi or rales.  Abdominal:     General: Abdomen is flat. Bowel sounds are normal. There is no distension.     Palpations: Abdomen is soft. There is no mass.     Tenderness: There is no abdominal tenderness. There is no guarding or rebound.     Hernia: No hernia is present.  Musculoskeletal: Normal  range of motion.  Lymphadenopathy:     Cervical: No cervical adenopathy.  Skin:    General: Skin is warm and dry.     Findings: No rash.  Neurological:     General: No focal deficit present.     Mental Status: He is alert and oriented to person, place, and time.     Comments: CN grossly intact, station and gait intact  Psychiatric:        Mood and Affect: Mood normal.        Behavior: Behavior normal.        Thought Content: Thought content normal.        Judgment: Judgment normal.       Results for orders placed or performed in visit on 02/06/19  CBC with Differential/Platelet  Result Value Ref Range   WBC 4.4 4.0 - 10.5 K/uL   RBC 5.05 4.22 - 5.81 Mil/uL   Hemoglobin 16.0 13.0 - 17.0 g/dL   HCT 30.846.7 65.739.0 - 84.652.0 %   MCV 92.5 78.0 - 100.0 fl   MCHC 34.4 30.0 - 36.0 g/dL   RDW 96.212.8 95.211.5 - 84.115.5 %   Platelets 216.0 150.0 - 400.0 K/uL   Neutrophils Relative % 46.6 43.0 - 77.0 %   Lymphocytes Relative 36.2 12.0 - 46.0 %   Monocytes Relative 12.5 (H) 3.0 - 12.0 %   Eosinophils Relative 3.7 0.0 - 5.0 %   Basophils Relative 1.0 0.0 - 3.0 %   Neutro Abs 2.0 1.4 - 7.7 K/uL   Lymphs Abs 1.6 0.7 - 4.0 K/uL   Monocytes Absolute 0.5 0.1 - 1.0 K/uL   Eosinophils Absolute 0.2 0.0 - 0.7 K/uL   Basophils Absolute 0.0 0.0 - 0.1 K/uL  PSA  Result Value Ref Range   PSA 0.53 0.10 - 4.00 ng/mL  Lipid panel  Result Value Ref Range   Cholesterol 194 0 - 200 mg/dL   Triglycerides 324.4123.0 0.0 - 149.0 mg/dL   HDL 01.0240.80 >72.53>39.00 mg/dL   VLDL 66.424.6 0.0 - 40.340.0 mg/dL   LDL Cholesterol 474129 (H) 0 - 99 mg/dL   Total CHOL/HDL Ratio 5    NonHDL 153.28   Comprehensive metabolic panel  Result Value Ref Range   Sodium 140 135 - 145 mEq/L   Potassium 4.6 3.5 - 5.1 mEq/L   Chloride 103 96 - 112 mEq/L   CO2 32 19 - 32 mEq/L   Glucose, Bld 95 70 - 99 mg/dL   BUN 15 6 - 23 mg/dL   Creatinine, Ser 2.590.70 0.40 - 1.50 mg/dL   Total Bilirubin 0.5 0.2 - 1.2 mg/dL   Alkaline Phosphatase 63 39 - 117 U/L   AST 11  0 - 37 U/L   ALT 13 0 - 53 U/L   Total Protein 6.3 6.0 - 8.3 g/dL   Albumin 4.1 3.5 - 5.2 g/dL   Calcium 9.2 8.4 - 56.310.5 mg/dL   GFR 875.64120.55 >33.29>60.00 mL/min   Assessment & Plan:   Problem List Items Addressed This Visit    Obesity, Class I, BMI 30-34.9    Weight gain noted. Encouraged healthy diet and lifestyle choices for goal sustainable weight loss.       Hypertriglyceridemia    Marked improvement with decreased alcohol use. The 10-year ASCVD risk score Denman George(Goff DC Montez HagemanJr., et al., 2013) is: 2.9%   Values used to calculate the score:     Age: [age] years     Sex: Male     Is Non-Hispanic African American: No     Diabetic: No     Tobacco smoker: No     Systolic Blood Pressure: 124 mmHg     Is BP treated: No     HDL Cholesterol: 40.8 mg/dL     Total Cholesterol: 194 mg/dL       Health maintenance examination - Primary    Preventative protocols reviewed and updated unless pt declined. Discussed healthy diet and lifestyle.       Family history of prostate cancer in father    Discussed yearly PSA and likely QOY DRE      Bipolar 2 disorder (HCC)    Chronic, stable. Continue depakote/celexa.       Alcohol abuse    Significant decrease in alcohol intake. Congratulated, encouraged continued  abstinence. No longer using naltrexone.           Meds ordered this encounter  Medications  . citalopram (CELEXA) 10 MG tablet    Sig: Take 1 tablet (10 mg total) by mouth 2 (two) times a day.    Dispense:  180 tablet    Refill:  3  . divalproex (DEPAKOTE) 500 MG DR tablet    Sig: Take 1 tablet (500 mg total) by mouth 2 (two) times daily.    Dispense:  180 tablet    Refill:  3   No orders of the defined types were placed in this encounter.   Follow up plan: Return in about 1 year (around 05/13/2020) for annual exam, prior fasting for blood work.  Eustaquio BoydenJavier Stanislaus Kaltenbach, MD

## 2019-05-14 NOTE — Assessment & Plan Note (Signed)
Marked improvement with decreased alcohol use. The 10-year ASCVD risk score Mikey Bussing DC Brooke Bonito., et al., 2013) is: 2.9%   Values used to calculate the score:     Age: 48 years     Sex: Male     Is Non-Hispanic African American: No     Diabetic: No     Tobacco smoker: No     Systolic Blood Pressure: 390 mmHg     Is BP treated: No     HDL Cholesterol: 40.8 mg/dL     Total Cholesterol: 194 mg/dL

## 2019-05-14 NOTE — Assessment & Plan Note (Signed)
Significant decrease in alcohol intake. Congratulated, encouraged continued abstinence. No longer using naltrexone.

## 2019-05-14 NOTE — Assessment & Plan Note (Signed)
Weight gain noted. Encouraged healthy diet and lifestyle choices for goal sustainable weight loss.

## 2019-05-14 NOTE — Assessment & Plan Note (Signed)
Preventative protocols reviewed and updated unless pt declined. Discussed healthy diet and lifestyle.  

## 2019-05-14 NOTE — Patient Instructions (Addendum)
You are doing well today  Continue biking for goal weight loss.  Return as needed or in 1 year for next physical.  Health Maintenance, Male A healthy lifestyle and preventive care is important for your health and wellness. Ask your health care provider about what schedule of regular examinations is right for you. What should I know about weight and diet? Eat a Healthy Diet  Eat plenty of vegetables, fruits, whole grains, low-fat dairy products, and lean protein.  Do not eat a lot of foods high in solid fats, added sugars, or salt.  Maintain a Healthy Weight Regular exercise can help you achieve or maintain a healthy weight. You should:  Do at least 150 minutes of exercise each week. The exercise should increase your heart rate and make you sweat (moderate-intensity exercise).  Do strength-training exercises at least twice a week. Watch Your Levels of Cholesterol and Blood Lipids  Have your blood tested for lipids and cholesterol every 5 years starting at 48 years of age. If you are at high risk for heart disease, you should start having your blood tested when you are 48 years old. You may need to have your cholesterol levels checked more often if: ? Your lipid or cholesterol levels are high. ? You are older than 48 years of age. ? You are at high risk for heart disease. What should I know about cancer screening? Many types of cancers can be detected early and may often be prevented. Lung Cancer  You should be screened every year for lung cancer if: ? You are a current smoker who has smoked for at least 30 years. ? You are a former smoker who has quit within the past 15 years.  Talk to your health care provider about your screening options, when you should start screening, and how often you should be screened. Colorectal Cancer  Routine colorectal cancer screening usually begins at 48 years of age and should be repeated every 5-10 years until you are 48 years old. You may need to  be screened more often if early forms of precancerous polyps or small growths are found. Your health care provider may recommend screening at an earlier age if you have risk factors for colon cancer.  Your health care provider may recommend using home test kits to check for hidden blood in the stool.  A small camera at the end of a tube can be used to examine your colon (sigmoidoscopy or colonoscopy). This checks for the earliest forms of colorectal cancer. Prostate and Testicular Cancer  Depending on your age and overall health, your health care provider may do certain tests to screen for prostate and testicular cancer.  Talk to your health care provider about any symptoms or concerns you have about testicular or prostate cancer. Skin Cancer  Check your skin from head to toe regularly.  Tell your health care provider about any new moles or changes in moles, especially if: ? There is a change in a mole's size, shape, or color. ? You have a mole that is larger than a pencil eraser.  Always use sunscreen. Apply sunscreen liberally and repeat throughout the day.  Protect yourself by wearing long sleeves, pants, a wide-brimmed hat, and sunglasses when outside. What should I know about heart disease, diabetes, and high blood pressure?  If you are 4718-10539 years of age, have your blood pressure checked every 3-5 years. If you are 48 years of age or older, have your blood pressure checked every year. You  should have your blood pressure measured twice-once when you are at a hospital or clinic, and once when you are not at a hospital or clinic. Record the average of the two measurements. To check your blood pressure when you are not at a hospital or clinic, you can use: ? An automated blood pressure machine at a pharmacy. ? A home blood pressure monitor.  Talk to your health care provider about your target blood pressure.  If you are between 20-95 years old, ask your health care provider if you  should take aspirin to prevent heart disease.  Have regular diabetes screenings by checking your fasting blood sugar level. ? If you are at a normal weight and have a low risk for diabetes, have this test once every three years after the age of 39. ? If you are overweight and have a high risk for diabetes, consider being tested at a younger age or more often.  A one-time screening for abdominal aortic aneurysm (AAA) by ultrasound is recommended for men aged 10-75 years who are current or former smokers. What should I know about preventing infection? Hepatitis B If you have a higher risk for hepatitis B, you should be screened for this virus. Talk with your health care provider to find out if you are at risk for hepatitis B infection. Hepatitis C Blood testing is recommended for:  Everyone born from [birth year] through 1965.  Anyone with known risk factors for hepatitis C. Sexually Transmitted Diseases (STDs)  You should be screened each year for STDs including gonorrhea and chlamydia if: ? You are sexually active and are younger than 48 years of age. ? You are older than 48 years of age and your health care provider tells you that you are at risk for this type of infection. ? Your sexual activity has changed since you were last screened and you are at an increased risk for chlamydia or gonorrhea. Ask your health care provider if you are at risk.  Talk with your health care provider about whether you are at high risk of being infected with HIV. Your health care provider may recommend a prescription medicine to help prevent HIV infection. What else can I do?  Schedule regular health, dental, and eye exams.  Stay current with your vaccines (immunizations).  Do not use any tobacco products, such as cigarettes, chewing tobacco, and e-cigarettes. If you need help quitting, ask your health care provider.  Limit alcohol intake to no more than 2 drinks per day. One drink equals 12 ounces of beer, 5  ounces of wine, or 1 ounces of hard liquor.  Do not use street drugs.  Do not share needles.  Ask your health care provider for help if you need support or information about quitting drugs.  Tell your health care provider if you often feel depressed.  Tell your health care provider if you have ever been abused or do not feel safe at home. This information is not intended to replace advice given to you by your health care provider. Make sure you discuss any questions you have with your health care provider. Document Released: 05/05/2008 Document Revised: 07/06/2016 Document Reviewed: 08/11/2015 Elsevier Interactive Patient Education  2019 [employer].

## 2019-05-24 ENCOUNTER — Encounter: Payer: Self-pay | Admitting: Family Medicine

## 2019-05-24 DIAGNOSIS — M1711 Unilateral primary osteoarthritis, right knee: Secondary | ICD-10-CM | POA: Insufficient documentation

## 2019-06-27 ENCOUNTER — Telehealth: Payer: Self-pay

## 2019-06-27 ENCOUNTER — Encounter: Payer: Self-pay | Admitting: Family Medicine

## 2019-06-27 ENCOUNTER — Ambulatory Visit (INDEPENDENT_AMBULATORY_CARE_PROVIDER_SITE_OTHER): Payer: 59 | Admitting: Family Medicine

## 2019-06-27 DIAGNOSIS — J0110 Acute frontal sinusitis, unspecified: Secondary | ICD-10-CM | POA: Diagnosis not present

## 2019-06-27 DIAGNOSIS — J329 Chronic sinusitis, unspecified: Secondary | ICD-10-CM

## 2019-06-27 DIAGNOSIS — J019 Acute sinusitis, unspecified: Secondary | ICD-10-CM | POA: Insufficient documentation

## 2019-06-27 MED ORDER — FLUTICASONE PROPIONATE 50 MCG/ACT NA SUSP: 2.0000 | Freq: Every day | NASAL | 6 refills | Status: DC

## 2019-06-27 MED ORDER — AMOXICILLIN-POT CLAVULANATE 875-125 MG PO TABS: 1.0000 | ORAL_TABLET | Freq: Two times a day (BID) | ORAL | 0 refills | Status: DC

## 2019-06-27 NOTE — Patient Instructions (Addendum)
Stop afrin -this adds to chronic congestion   Use flonase daily  Take the augmentin as directed  An expectorant like mucinex or robitussin helps loosen nasal congestion  Saline spray   Update if not starting to improve in a week or if worsening   Also if you develop new symptoms like fever/cough/shortness of breath/GI symptoms or loss of taste or smell

## 2019-06-27 NOTE — Telephone Encounter (Signed)
Pt said 3-4 x a yr pt has sinus h/a or infection. For about 1 wk pt has sinus H/A that is basically continuous. Can feel pressure or pain behind eyes and ears are cracking and popping. Prod couth with yellow phlegm. Pt has been taking tylenol sinus and nasal spray. Pt would like a zpak and flonase. No fever or other covid symptoms beside H/A and cough. No travel, no known exposure to + covid. Pt scheduled virtual visit 06/27/19 at 11:30 with Dr Glori Bickers.

## 2019-06-27 NOTE — Assessment & Plan Note (Signed)
With chronic congestion (inst to stop afrin) Sent in augmentin and flonase to use as directed  Nasal saline Expectorant prn  Update if not starting to improve in a week or if worsening    Revs/s of covid - fever/cough/sob/GI/ smell or taste change- would consider testing  He voiced understanding

## 2019-06-27 NOTE — Assessment & Plan Note (Signed)
Again -enc strongly to stop afrin -this may be adding to chronic congestion

## 2019-06-27 NOTE — Telephone Encounter (Signed)
I will see him then

## 2019-06-27 NOTE — Progress Notes (Signed)
Virtual Visit via Video Note  I connected with Danny [name] on 06/27/19 at 11:30 AM EDT by a video enabled telemedicine application and verified that I am speaking with the correct person using two identifiers.  Location: Patient: home Provider: office    I discussed the limitations of evaluation and management by telemedicine and the availability of in person appointments. The patient expressed understanding and agreed to proceed.  History of Present Illness: Pt presents with HA and cough/nasal congestion and possible sinusitis  6 days   He gets this 4-5 times per year   Taking tylenol sinus  Uses nasal spray  Yellow/grey nasal d/c  Not a smoker - quit 5 y ago   Cough- only to clear throat  No ST  No fever   HA- pressure in forehead and behind eyes and below eyes  Teeth hurt  Same on both sides  No h/o migraine   Out of flonase -that usually helps   No covid contacts  No fever   Patient Active Problem List   Diagnosis Date Noted  . Primary osteoarthritis of right knee 05/24/2019  . Family history of prostate cancer in father 02/03/2019  . Cough 11/04/2017  . Neuropathy 02/06/2017  . Hypertriglyceridemia 10/08/2016  . Alcohol abuse 07/15/2016  . Obesity, Class I, BMI 30-34.9 10/12/2015  . Health maintenance examination 10/12/2015  . OCD (obsessive compulsive disorder) 08/06/2015  . Chronic congestion of paranasal sinus 08/06/2015  . Bipolar 2 disorder [facility](HCC)    Past Medical History:  Diagnosis Date  . Anxiety   . Anxiety and depression    states controlled with meds  . Headache(784.0)    sinus headaches  . Impaired glucose tolerance    denies diabetes- states glucose elevated x 1 in past  . Multiple allergies    seansonal,environmental  . Perianal fistula, posterior midline 02/06/2012  . Pilonidal cyst    Past Surgical History:  Procedure Laterality Date  . ANAL FISTULECTOMY  03/08/12   EUA  . ANTERIOR CRUCIATE LIGAMENT REPAIR  2008   right  .  SHOULDER ARTHROSCOPY WITH ROTATOR CUFF REPAIR AND SUBACROMIAL DECOMPRESSION Right 12/10/2013   Procedure: SHOULDER ARTHROSCOPY WITH SUBACROMIAL DECOMPRESSION, DISTAL CALVICLE RESECTION, OPEN ROTATOR CUFF REPAIR.;  Surgeon: Valeria BatmanPeter W Whitfield, MD;  Location: [facility];  Service: Orthopedics;  Laterality: Right;  Marland Kitchen. VASECTOMY  2000  . WISDOM TOOTH EXTRACTION     right  lower   Social History   Tobacco Use  . Smoking status: Former Smoker    Quit date: 11/21/2009    Years since quitting: 9.6  . Smokeless tobacco: Current User    Types: Chew  Substance Use Topics  . Alcohol use: No    Alcohol/week: 0.0 standard drinks    Comment: 12 pk week--Quit Dec 2017  . Drug use: No   Family History  Problem Relation Age of Onset  . Cancer Father [phone]       prostate  . Anxiety disorder Father   . Depression Mother   . Heart disease Maternal Grandfather   . Cancer Paternal Grandfather        lung  . Colon cancer Neg Hx    No Known Allergies Current Outpatient Medications on File Prior to Visit  Medication Sig Dispense Refill  . citalopram (CELEXA) 10 MG tablet Take 1 tablet (10 mg total) by mouth 2 (two) times a day. 180 tablet 3  . divalproex (DEPAKOTE) 500 MG DR tablet Take 1 tablet (500 mg total)  by mouth 2 (two) times daily. 180 tablet 3  . naproxen (NAPROSYN) 500 MG tablet TAKE 1 TABLET BY MOUTH TWICE A DAY FOR 1 WEEK THEN AS NEEDED FOR PAIN. TAKE WITH FOOD. 50 tablet 0   No current facility-administered medications on file prior to visit.    Review of Systems  Constitutional: Negative for chills, fever and malaise/fatigue.  HENT: Positive for congestion and sinus pain. Negative for ear discharge, ear pain and sore throat.   Eyes: Negative for discharge and redness.  Respiratory: Positive for cough. Negative for sputum production, shortness of breath and wheezing.   Cardiovascular: Negative for chest pain and palpitations.  Gastrointestinal: Negative for nausea and vomiting.   Skin: Negative for rash.  Neurological: Positive for headaches. Negative for dizziness.      Observations/Objective:Patient appears well, in no distress Weight is baseline (overwt) No facial swelling or asymmetry  Patient points to his forehead and cheeks as areas o fpain  Normal voice-not hoarse and no slurred speech No obvious tremor or mobility impairment Moving neck and UEs normally Able to hear the call well  No cough or shortness of breath during interview  Talkative and mentally sharp with no cognitive changes No skin changes on face or neck , no rash or pallor Affect is normal    Assessment and Plan: Problem List Items Addressed This Visit      Respiratory   Chronic congestion of paranasal sinus    Again -enc strongly to stop afrin -this may be adding to chronic congestion       Acute sinusitis - Primary    With chronic congestion (inst to stop afrin) Sent in augmentin and flonase to use as directed  Nasal saline Expectorant prn  Update if not starting to improve in a week or if worsening    Revs/s of covid - fever/cough/sob/GI/ smell or taste change- would consider testing  He voiced understanding      Relevant Medications   fluticasone (FLONASE) 50 MCG/ACT nasal spray   amoxicillin-clavulanate (AUGMENTIN) 875-125 MG tablet       Follow Up Instructions: Stop afrin -this adds to chronic congestion   Use flonase daily  Take the augmentin as directed  An expectorant like mucinex or robitussin helps loosen nasal congestion  Saline spray   Update if not starting to improve in a week or if worsening   Also if you develop new symptoms like fever/cough/shortness of breath/GI symptoms or loss of taste or smell    I discussed the assessment and treatment plan with the patient. The patient was provided an opportunity to ask questions and all were answered. The patient agreed with the plan and demonstrated an understanding of the instructions.   The patient was  advised to call back or seek an in-person evaluation if the symptoms worsen or if the condition fails to improve as anticipated.    Loura Pardon, MD

## 2019-07-30 ENCOUNTER — Other Ambulatory Visit: Payer: Self-pay | Admitting: Family Medicine

## 2019-09-22 DIAGNOSIS — U071 COVID-19: Secondary | ICD-10-CM

## 2019-09-22 HISTORY — DX: COVID-19: U07.1

## 2019-09-23 ENCOUNTER — Encounter: Payer: Self-pay | Admitting: Family Medicine

## 2019-09-26 ENCOUNTER — Telehealth: Payer: Self-pay

## 2019-09-26 DIAGNOSIS — F429 Obsessive-compulsive disorder, unspecified: Secondary | ICD-10-CM

## 2019-09-26 DIAGNOSIS — F3181 Bipolar II disorder: Secondary | ICD-10-CM

## 2019-09-26 DIAGNOSIS — F1010 Alcohol abuse, uncomplicated: Secondary | ICD-10-CM

## 2019-09-26 NOTE — Telephone Encounter (Signed)
Copied from [place] [id number]. Topic: Appointment Scheduling - Scheduling Inquiry for Clinic >> Sep 25, 2019  5:08 PM Alease Frame wrote: Reason for CRM: Patient spoke with Dr on My chart and is asking for a referral for a therapist or counselor  Please advise

## 2019-09-26 NOTE — Telephone Encounter (Signed)
Referral placed.

## 2019-09-26 NOTE — Telephone Encounter (Signed)
See 09/23/19 pt message.

## 2019-09-27 NOTE — Telephone Encounter (Signed)
Called and spoke with the patient and gave him the [facility] phone number to call and schedule his appointment.

## 2019-10-21 ENCOUNTER — Other Ambulatory Visit: Payer: Self-pay

## 2019-10-21 DIAGNOSIS — Z20822 Contact with and (suspected) exposure to covid-19: Secondary | ICD-10-CM

## 2019-10-22 LAB — NOVEL CORONAVIRUS, NAA: SARS-CoV-2, NAA: DETECTED — AB

## 2019-10-29 ENCOUNTER — Telehealth: Payer: Self-pay

## 2019-10-29 NOTE — Telephone Encounter (Signed)
Left message on vm per dpr asking pt to call back with an update on his sxs.    

## 2019-10-30 NOTE — Telephone Encounter (Signed)
Left message on vm per dpr asking pt to call back with an update on his sxs.    

## 2019-10-31 NOTE — Telephone Encounter (Signed)
Left message on vm per dpr asking pt to call back with an update on his sxs.    

## 2019-11-01 NOTE — Telephone Encounter (Addendum)
Left message on vm per dpr asking pt to call back with an update on his sxs.

## 2020-04-09 ENCOUNTER — Other Ambulatory Visit: Payer: Self-pay | Admitting: Family Medicine

## 2020-04-09 MED ORDER — NAPROXEN 500 MG PO TABS: 500.0000 mg | ORAL_TABLET | Freq: Every day | ORAL | 0 refills | Status: DC | PRN

## 2020-04-16 ENCOUNTER — Telehealth: Payer: Self-pay

## 2020-04-16 ENCOUNTER — Ambulatory Visit: Payer: 59 | Admitting: Orthopaedic Surgery

## 2020-04-16 ENCOUNTER — Other Ambulatory Visit: Payer: Self-pay

## 2020-04-16 ENCOUNTER — Encounter: Payer: Self-pay | Admitting: Orthopaedic Surgery

## 2020-04-16 VITALS — Ht 70.0 in | Wt 241.0 lb

## 2020-04-16 DIAGNOSIS — M1711 Unilateral primary osteoarthritis, right knee: Secondary | ICD-10-CM | POA: Diagnosis not present

## 2020-04-16 MED ORDER — METHYLPREDNISOLONE ACETATE 40 MG/ML IJ SUSP
80.0000 mg | INTRAMUSCULAR | Status: AC | PRN
  Administered 2020-04-16: 80 mg via INTRA_ARTICULAR

## 2020-04-16 MED ORDER — BUPIVACAINE HCL 0.25 % IJ SOLN
2.0000 mL | INTRAMUSCULAR | Status: AC | PRN
  Administered 2020-04-16: 2 mL via INTRA_ARTICULAR

## 2020-04-16 MED ORDER — LIDOCAINE HCL 1 % IJ SOLN
2.0000 mL | INTRAMUSCULAR | Status: AC | PRN
  Administered 2020-04-16: 2 mL

## 2020-04-16 NOTE — Progress Notes (Signed)
Office Visit Note   Patient: Danny [name]           Date of Birth: 1971-01-31           MRN: [id number] Visit Date: 04/16/2020              Requested by: Ria Bush, MD [address] [place],  [place] [address] PCP: Ria Bush, MD   Assessment & Plan: Visit Diagnoses:  1. Primary osteoarthritis of right knee     Plan:  #1: Corticosteroid injection was given to the right knee with minimal difficulty. #2: We will have him precertified for Visco supplementation once that is complete he can return and we will begin the series. he had done well with the viscosupplementation previously.  Follow-Up Instructions: No follow-ups on file.   Orders:  No orders of the defined types were placed in this encounter.  No orders of the defined types were placed in this encounter.     Procedures: Large Joint Inj: R knee on 04/16/2020 3:45 PM Indications: pain and diagnostic evaluation Details: 25 G 1.5 in needle, anteromedial approach  Arthrogram: No  Medications: 2 mL lidocaine 1 %; 80 mg methylPREDNISolone acetate 40 MG/ML; 2 mL bupivacaine 0.25 % Outcome: tolerated well, no immediate complications Procedure, treatment alternatives, risks and benefits explained, specific risks discussed. Consent was given by the patient. Immediately prior to procedure a time out was called to verify the correct patient, procedure, equipment, support staff and site/side marked as required. Patient was prepped and draped in the usual sterile fashion.       Clinical Data: No additional findings.   Subjective: Chief Complaint  Patient presents with  . Right Knee - Pain    HPI Patient presents today for recurrent right knee pain. He was here last and finished Euflexxa injections on 03/19/2019. He said that it has been "popping and cracking" lately, and seems to be getting worse. He takes Naproxen for pain. He is back today and wants a cortisone injection. He also wants to get  approval for gel injections too.    Review of Systems  Constitutional: Negative for fatigue.  HENT: Negative for ear pain.   Eyes: Negative for pain.  Respiratory: Negative for shortness of breath.   Cardiovascular: Negative for leg swelling.  Gastrointestinal: Negative for constipation and diarrhea.  Endocrine: Negative for cold intolerance and heat intolerance.  Genitourinary: Negative for difficulty urinating.  Musculoskeletal: Positive for joint swelling.  Skin: Negative for rash.  Allergic/Immunologic: Negative for food allergies.  Neurological: Negative for weakness.  Hematological: Does not bruise/bleed easily.  Psychiatric/Behavioral: Negative for sleep disturbance.     Objective: Vital Signs: Ht 5\' 10"  (1.778 m)   Wt 241 lb (109.3 kg)   BMI 34.58 kg/m   Physical Exam Constitutional:      Appearance: Normal appearance. He is well-developed.  HENT:     Head: Normocephalic.  Eyes:     Pupils: Pupils are equal, round, and reactive to light.  Pulmonary:     Effort: Pulmonary effort is normal.  Skin:    General: Skin is warm and dry.  Neurological:     Mental Status: He is oriented to person, place, and time.  Psychiatric:        Behavior: Behavior normal.     Ortho Exam  The right knee today does not have much in the way of an effusion.  He has range of motion from few degrees shy of full extension to around  105 degrees of flexion.  Patellofemoral crepitance with range of motion.  He does have some pseudolaxity with varus and valgus stressing but he does have endpoints.  Skin is intact.  Calf is supple nontender.  Good motion of the hip.  Specialty Comments:  No specialty comments available.  Imaging: No results found.   PMFS History: Current Outpatient Medications  Medication Sig Dispense Refill  . citalopram (CELEXA) 10 MG tablet Take 1 tablet (10 mg total) by mouth 2 (two) times a day. 180 tablet 3  . divalproex (DEPAKOTE) 500 MG DR tablet Take 1  tablet (500 mg total) by mouth 2 (two) times daily. 180 tablet 3  . fluticasone (FLONASE) 50 MCG/ACT nasal spray Place 2 sprays into both nostrils daily. 16 g 6  . naproxen (NAPROSYN) 500 MG tablet Take 1 tablet (500 mg total) by mouth daily as needed. 50 tablet 0  . amoxicillin-clavulanate (AUGMENTIN) 875-125 MG tablet Take 1 tablet by mouth 2 (two) times daily. 14 tablet 0   No current facility-administered medications for this visit.    Patient Active Problem List   Diagnosis Date Noted  . Acute sinusitis 06/27/2019  . Primary osteoarthritis of right knee 05/24/2019  . Family history of prostate cancer in father 02/03/2019  . Cough 11/04/2017  . Neuropathy 02/06/2017  . Hypertriglyceridemia 10/08/2016  . Alcohol abuse 07/15/2016  . Obesity, Class I, BMI 30-34.9 10/12/2015  . Health maintenance examination 10/12/2015  . OCD (obsessive compulsive disorder) 08/06/2015  . Chronic congestion of paranasal sinus 08/06/2015  . Bipolar 2 disorder [facility])    Past Medical History:  Diagnosis Date  . Anxiety   . Anxiety and depression    states controlled with meds  . Headache(784.0)    sinus headaches  . Impaired glucose tolerance    denies diabetes- states glucose elevated x 1 in past  . Multiple allergies    seansonal,environmental  . Perianal fistula, posterior midline 02/06/2012  . Pilonidal cyst     Family History  Problem Relation Age of Onset  . Cancer Father 76       prostate  . Anxiety disorder Father   . Depression Mother   . Heart disease Maternal Grandfather   . Cancer Paternal Grandfather        lung  . Colon cancer Neg Hx     Past Surgical History:  Procedure Laterality Date  . ANAL FISTULECTOMY  03/08/12   EUA  . ANTERIOR CRUCIATE LIGAMENT REPAIR  2008   right  . SHOULDER ARTHROSCOPY WITH ROTATOR CUFF REPAIR AND SUBACROMIAL DECOMPRESSION Right 12/10/2013   Procedure: SHOULDER ARTHROSCOPY WITH SUBACROMIAL DECOMPRESSION, DISTAL CALVICLE RESECTION, OPEN ROTATOR  CUFF REPAIR.;  Surgeon: Valeria Batman, MD;  Location: [facility];  Service: Orthopedics;  Laterality: Right;  Marland Kitchen VASECTOMY  2000  . WISDOM TOOTH EXTRACTION     right  lower   Social History   Occupational History  . Not on file  Tobacco Use  . Smoking status: Former Smoker    Quit date: 11/21/2009    Years since quitting: 10.4  . Smokeless tobacco: Current User    Types: Chew  Substance and Sexual Activity  . Alcohol use: No    Alcohol/week: 0.0 standard drinks    Comment: 12 pk week--Quit Dec 2017  . Drug use: No  . Sexual activity: Not on file

## 2020-04-16 NOTE — Telephone Encounter (Signed)
Please precert for right knee gel injections. This is Dr.Whitfield's patient. Thanks!  

## 2020-04-21 NOTE — Telephone Encounter (Signed)
Noted  

## 2020-04-23 ENCOUNTER — Telehealth: Payer: Self-pay

## 2020-04-23 NOTE — Telephone Encounter (Signed)
Submitted VOB for Euflexxa, right knee. 

## 2020-04-24 ENCOUNTER — Telehealth: Payer: Self-pay

## 2020-04-24 NOTE — Telephone Encounter (Signed)
Approved, Euflexxa series, right knee. Buy & Bill Must meet deductible first Patient will be responsible for 20% OOP. Co-pay of $50.00 No PA required

## 2020-05-06 ENCOUNTER — Other Ambulatory Visit: Payer: Self-pay

## 2020-05-06 ENCOUNTER — Other Ambulatory Visit: Payer: Self-pay | Admitting: Family Medicine

## 2020-05-06 DIAGNOSIS — Z1159 Encounter for screening for other viral diseases: Secondary | ICD-10-CM

## 2020-05-06 DIAGNOSIS — E781 Pure hyperglyceridemia: Secondary | ICD-10-CM

## 2020-05-06 DIAGNOSIS — Z8042 Family history of malignant neoplasm of prostate: Secondary | ICD-10-CM

## 2020-05-06 DIAGNOSIS — F3181 Bipolar II disorder: Secondary | ICD-10-CM

## 2020-05-07 ENCOUNTER — Other Ambulatory Visit (INDEPENDENT_AMBULATORY_CARE_PROVIDER_SITE_OTHER): Payer: 59

## 2020-05-07 DIAGNOSIS — E781 Pure hyperglyceridemia: Secondary | ICD-10-CM | POA: Diagnosis not present

## 2020-05-07 DIAGNOSIS — Z8042 Family history of malignant neoplasm of prostate: Secondary | ICD-10-CM | POA: Diagnosis not present

## 2020-05-07 DIAGNOSIS — F3181 Bipolar II disorder: Secondary | ICD-10-CM

## 2020-05-07 DIAGNOSIS — Z1159 Encounter for screening for other viral diseases: Secondary | ICD-10-CM

## 2020-05-07 LAB — PSA: PSA: 0.44 ng/mL (ref 0.10–4.00)

## 2020-05-07 LAB — COMPREHENSIVE METABOLIC PANEL
ALT: 18 U/L (ref 0–53)
AST: 16 U/L (ref 0–37)
Albumin: 4.2 g/dL (ref 3.5–5.2)
Alkaline Phosphatase: 59 U/L (ref 39–117)
BUN: 16 mg/dL (ref 6–23)
CO2: 29 mEq/L (ref 19–32)
Calcium: 9.3 mg/dL (ref 8.4–10.5)
Chloride: 104 mEq/L (ref 96–112)
Creatinine, Ser: 0.69 mg/dL (ref 0.40–1.50)
GFR: 121.93 mL/min (ref >60.00)
Glucose, Bld: 95 mg/dL (ref 70–99)
Potassium: 4.6 mEq/L (ref 3.5–5.1)
Sodium: 140 mEq/L (ref 135–145)
Total Bilirubin: 0.4 mg/dL (ref 0.2–1.2)
Total Protein: 6.3 g/dL (ref 6.0–8.3)

## 2020-05-07 LAB — LIPID PANEL
Cholesterol: 192 mg/dL (ref 0–200)
HDL: 32.6 mg/dL — ABNORMAL LOW (ref >39.00)
LDL Cholesterol: 124 mg/dL — ABNORMAL HIGH (ref 0–99)
NonHDL: 159.35
Total CHOL/HDL Ratio: 6
Triglycerides: 175 mg/dL — ABNORMAL HIGH (ref 0.0–149.0)
VLDL: 35 mg/dL (ref 0.0–40.0)

## 2020-05-07 LAB — CBC WITH DIFFERENTIAL/PLATELET
Basophils Absolute: 0 10*3/uL (ref 0.0–0.1)
Basophils Relative: 1 % (ref 0.0–3.0)
Eosinophils Absolute: 0.1 10*3/uL (ref 0.0–0.7)
Eosinophils Relative: 2.8 % (ref 0.0–5.0)
HCT: 45 % (ref 39.0–52.0)
Hemoglobin: 15.5 g/dL (ref 13.0–17.0)
Lymphocytes Relative: 41.1 % (ref 12.0–46.0)
Lymphs Abs: 1.7 10*3/uL (ref 0.7–4.0)
MCHC: 34.6 g/dL (ref 30.0–36.0)
MCV: 91.1 fl (ref 78.0–100.0)
Monocytes Absolute: 0.6 10*3/uL (ref 0.1–1.0)
Monocytes Relative: 15.4 % — ABNORMAL HIGH (ref 3.0–12.0)
Neutro Abs: 1.6 10*3/uL (ref 1.4–7.7)
Neutrophils Relative %: 39.7 % — ABNORMAL LOW (ref 43.0–77.0)
Platelets: 188 10*3/uL (ref 150.0–400.0)
RBC: 4.94 Mil/uL (ref 4.22–5.81)
RDW: 12.6 % (ref 11.5–15.5)
WBC: 4.1 10*3/uL (ref 4.0–10.5)

## 2020-05-07 LAB — TSH: TSH: 2.51 u[IU]/mL (ref 0.35–4.50)

## 2020-05-08 LAB — HEPATITIS C ANTIBODY
Hepatitis C Ab: NONREACTIVE
SIGNAL TO CUT-OFF: 0.01 (ref <1.00)

## 2020-05-12 ENCOUNTER — Other Ambulatory Visit: Payer: Self-pay

## 2020-05-12 ENCOUNTER — Ambulatory Visit: Payer: 59 | Admitting: Orthopaedic Surgery

## 2020-05-12 ENCOUNTER — Encounter: Payer: Self-pay | Admitting: Orthopaedic Surgery

## 2020-05-12 VITALS — Ht 70.0 in | Wt 241.0 lb

## 2020-05-12 DIAGNOSIS — M1711 Unilateral primary osteoarthritis, right knee: Secondary | ICD-10-CM | POA: Diagnosis not present

## 2020-05-12 MED ORDER — SODIUM HYALURONATE (VISCOSUP) 20 MG/2ML IX SOSY
20.0000 mg | PREFILLED_SYRINGE | INTRA_ARTICULAR | Status: AC | PRN
  Administered 2020-05-12: 20 mg via INTRA_ARTICULAR

## 2020-05-12 MED ORDER — LIDOCAINE HCL 1 % IJ SOLN
2.0000 mL | INTRAMUSCULAR | Status: AC | PRN
  Administered 2020-05-12: 2 mL

## 2020-05-12 NOTE — Progress Notes (Signed)
Office Visit Note   Patient: ROCIO [name]           Date of Birth: 10-Sep-1971           MRN: [id number] Visit Date: 05/12/2020              Requested by: Ria Bush, MD [address] [place],  [place] [address] PCP: Ria Bush, MD   Assessment & Plan: Visit Diagnoses:  1. Primary osteoarthritis of right knee     Plan:  #1: First Euflexxa injection was given to the right knee without difficulty. Tolerated the procedure well. #2: Follow-up in 1 week for the second Euflexxa injection to the right knee. We will get somebody is getting she is  Follow-Up Instructions:  Return in 1 week  Orders:  No orders of the defined types were placed in this encounter.  No orders of the defined types were placed in this encounter.     Procedures: Large Joint Inj: R knee on 05/12/2020 9:29 AM Indications: pain and joint swelling Details: 25 G 1.5 in needle  Arthrogram: No  Medications: 2 mL lidocaine 1 %; 20 mg Sodium Hyaluronate 20 MG/2ML Outcome: tolerated well, no immediate complications Procedure, treatment alternatives, risks and benefits explained, specific risks discussed. Consent was given by the patient. Immediately prior to procedure a time out was called to verify the correct patient, procedure, equipment, support staff and site/side marked as required. Patient was prepped and draped in the usual sterile fashion.       Clinical Data: No additional findings.   Subjective: Chief Complaint  Patient presents with  . Right Knee - Follow-up    Euflexxa injection series started 05-12-2020    HPI  Danny [name] is a very pleasant 49 year old white male who is seen today for evaluation of his right knee. He has had osteoarthritis of his right knee for several years. He had "corticosteroid injections in the past. He also had Euflexxa injections last year which completed on March 19, 2019. He is been having more mechanical and noise symptoms including that of  popping and cracking and is worsening. He uses naproxen for his pain. He had a corticosteroid injection previously and had requested Visco supplementation. He is here today for that.   Review of Systems  Constitutional: Negative for fatigue.  HENT: Negative for ear pain.   Eyes: Negative for pain.  Respiratory: Negative for shortness of breath.   Cardiovascular: Negative for leg swelling.  Gastrointestinal: Negative for constipation and diarrhea.  Endocrine: Negative for cold intolerance and heat intolerance.  Genitourinary: Negative for difficulty urinating.  Musculoskeletal: Positive for joint swelling.  Skin: Negative for rash.  Allergic/Immunologic: Negative for food allergies.  Neurological: Negative for weakness.  Hematological: Does not bruise/bleed easily.  Psychiatric/Behavioral: Negative for sleep disturbance.     Objective: Vital Signs: Ht 5\' 10"  (1.778 m)   Wt 241 lb (109.3 kg)   BMI 34.58 kg/m   Physical Exam Constitutional:      Appearance: He is well-developed.  Eyes:     Pupils: Pupils are equal, round, and reactive to light.  Pulmonary:     Effort: Pulmonary effort is normal.  Skin:    General: Skin is warm and dry.  Neurological:     Mental Status: He is alert and oriented to person, place, and time.  Psychiatric:        Behavior: Behavior normal.     Ortho Exam  Exam today reveals range of motion from near  full extension to 105 degrees of flexion. He does have patellofemoral crepitance with range of motion. Some pseudolaxity with varus and valgus stressing noted. Good endpoints though. Skin is intact. Calf supple nontender. Good motion of his hips.   Specialty Comments:  No specialty comments available.  Imaging: No results found.   PMFS History: Current Outpatient Medications  Medication Sig Dispense Refill  . citalopram (CELEXA) 10 MG tablet Take 1 tablet (10 mg total) by mouth 2 (two) times a day. 180 tablet 3  . divalproex (DEPAKOTE)  500 MG DR tablet Take 1 tablet (500 mg total) by mouth 2 (two) times daily. 180 tablet 3  . fluticasone (FLONASE) 50 MCG/ACT nasal spray Place 2 sprays into both nostrils daily. 16 g 6  . naproxen (NAPROSYN) 500 MG tablet Take 1 tablet (500 mg total) by mouth daily as needed. 50 tablet 0   No current facility-administered medications for this visit.    Patient Active Problem List   Diagnosis Date Noted  . Acute sinusitis 06/27/2019  . Primary osteoarthritis of right knee 05/24/2019  . Family history of prostate cancer in father 02/03/2019  . Cough 11/04/2017  . Neuropathy 02/06/2017  . Hypertriglyceridemia 10/08/2016  . Alcohol abuse 07/15/2016  . Obesity, Class I, BMI 30-34.9 10/12/2015  . Health maintenance examination 10/12/2015  . OCD (obsessive compulsive disorder) 08/06/2015  . Chronic congestion of paranasal sinus 08/06/2015  . Bipolar 2 disorder [facility])    Past Medical History:  Diagnosis Date  . Anxiety   . Anxiety and depression    states controlled with meds  . Headache(784.0)    sinus headaches  . Impaired glucose tolerance    denies diabetes- states glucose elevated x 1 in past  . Multiple allergies    seansonal,environmental  . Perianal fistula, posterior midline 02/06/2012  . Pilonidal cyst     Family History  Problem Relation Age of Onset  . Cancer Father 80       prostate  . Anxiety disorder Father   . Depression Mother   . Heart disease Maternal Grandfather   . Cancer Paternal Grandfather        lung  . Colon cancer Neg Hx     Past Surgical History:  Procedure Laterality Date  . ANAL FISTULECTOMY  03/08/12   EUA  . ANTERIOR CRUCIATE LIGAMENT REPAIR  2008   right  . SHOULDER ARTHROSCOPY WITH ROTATOR CUFF REPAIR AND SUBACROMIAL DECOMPRESSION Right 12/10/2013   Procedure: SHOULDER ARTHROSCOPY WITH SUBACROMIAL DECOMPRESSION, DISTAL CALVICLE RESECTION, OPEN ROTATOR CUFF REPAIR.;  Surgeon: Valeria Batman, MD;  Location: [facility];   Service: Orthopedics;  Laterality: Right;  Marland Kitchen VASECTOMY  2000  . WISDOM TOOTH EXTRACTION     right  lower   Social History   Occupational History  . Not on file  Tobacco Use  . Smoking status: Former Smoker    Quit date: 11/21/2009    Years since quitting: 10.4  . Smokeless tobacco: Current User    Types: Chew  Substance and Sexual Activity  . Alcohol use: No    Alcohol/week: 0.0 standard drinks    Comment: 12 pk week--Quit Dec 2017  . Drug use: No  . Sexual activity: Not on file

## 2020-05-15 ENCOUNTER — Encounter: Payer: 59 | Admitting: Family Medicine

## 2020-05-19 ENCOUNTER — Other Ambulatory Visit: Payer: Self-pay

## 2020-05-19 ENCOUNTER — Encounter: Payer: Self-pay | Admitting: Family Medicine

## 2020-05-19 ENCOUNTER — Ambulatory Visit (INDEPENDENT_AMBULATORY_CARE_PROVIDER_SITE_OTHER): Payer: 59 | Admitting: Family Medicine

## 2020-05-19 VITALS — BP 124/68 | HR 69 | Temp 98.2°F | Ht 69.75 in | Wt 251.0 lb

## 2020-05-19 DIAGNOSIS — E781 Pure hyperglyceridemia: Secondary | ICD-10-CM

## 2020-05-19 DIAGNOSIS — F3181 Bipolar II disorder: Secondary | ICD-10-CM | POA: Diagnosis not present

## 2020-05-19 DIAGNOSIS — F1011 Alcohol abuse, in remission: Secondary | ICD-10-CM

## 2020-05-19 DIAGNOSIS — Z0000 Encounter for general adult medical examination without abnormal findings: Secondary | ICD-10-CM

## 2020-05-19 DIAGNOSIS — R400 Somnolence: Secondary | ICD-10-CM

## 2020-05-19 DIAGNOSIS — Z8042 Family history of malignant neoplasm of prostate: Secondary | ICD-10-CM

## 2020-05-19 DIAGNOSIS — M1711 Unilateral primary osteoarthritis, right knee: Secondary | ICD-10-CM

## 2020-05-19 MED ORDER — DIVALPROEX SODIUM 500 MG PO DR TAB: 500.0000 mg | DELAYED_RELEASE_TABLET | Freq: Two times a day (BID) | ORAL | 3 refills | Status: DC

## 2020-05-19 MED ORDER — CITALOPRAM HYDROBROMIDE 20 MG PO TABS: 20.0000 mg | ORAL_TABLET | Freq: Every day | ORAL | 3 refills | Status: DC

## 2020-05-19 MED ORDER — NAPROXEN 500 MG PO TABS: 500.0000 mg | ORAL_TABLET | Freq: Every day | ORAL | 0 refills | Status: DC | PRN

## 2020-05-19 NOTE — Assessment & Plan Note (Signed)
Continues seeing ortho. Continues PRN naprosyn. Discussed caution with interaction between NSAID and SSRI.

## 2020-05-19 NOTE — Assessment & Plan Note (Signed)
DRE/PSA reassuring today.  

## 2020-05-19 NOTE — Assessment & Plan Note (Signed)
Congratulated on abstinence since 09/2019.  Continue to avoid. Previously on naltrexone.
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
## 2020-05-19 NOTE — Assessment & Plan Note (Addendum)
Chronic, stable. Continues improving off alcohol. Reviewed healthy diet and lifestyle choices to improve cholesterol control.  The 10-year ASCVD risk score Denman George DC Montez Hageman., et al., 2013) is: 4%   Values used to calculate the score:     Age: 49 years     Sex: Male     Is Non-Hispanic African American: No     Diabetic: No     Tobacco smoker: No     Systolic Blood Pressure: 124 mmHg     Is BP treated: No     HDL Cholesterol: 32.6 mg/dL     Total Cholesterol: 192 mg/dL

## 2020-05-19 NOTE — Patient Instructions (Addendum)
You are doing well today. Try decreasing depakote to 500mg  at night time. If worsening irritability, fluctuating moods, return to twice daily dosing. Celexa changed to 20mg  once daily in the mornings.  Check with insurance about coverage for colonoscopy and let me know. We would refer you to [facility].  Check on date of latest tetanus shot at work.  Return as needed or in 1 year for next physical.   Health Maintenance, Male Adopting a healthy lifestyle and getting preventive care are important in promoting health and wellness. Ask your health care provider about:  The right schedule for you to have regular tests and exams.  Things you can do on your own to prevent diseases and keep yourself healthy. What should I know about diet, weight, and exercise? Eat a healthy diet   Eat a diet that includes plenty of vegetables, fruits, low-fat dairy products, and lean protein.  Do not eat a lot of foods that are high in solid fats, added sugars, or sodium. Maintain a healthy weight Body mass index (BMI) is a measurement that can be used to identify possible weight problems. It estimates body fat based on height and weight. Your health care provider can help determine your BMI and help you achieve or maintain a healthy weight. Get regular exercise Get regular exercise. This is one of the most important things you can do for your health. Most adults should:  Exercise for at least 150 minutes each week. The exercise should increase your heart rate and make you sweat (moderate-intensity exercise).  Do strengthening exercises at least twice a week. This is in addition to the moderate-intensity exercise.  Spend less time sitting. Even light physical activity can be beneficial. Watch cholesterol and blood lipids Have your blood tested for lipids and cholesterol at 49 years of age, then have this test every 5 years. You may need to have your cholesterol levels checked more often if:  Your lipid or  cholesterol levels are high.  You are older than 49 years of age.  You are at high risk for heart disease. What should I know about cancer screening? Many types of cancers can be detected early and may often be prevented. Depending on your health history and family history, you may need to have cancer screening at various ages. This may include screening for:  Colorectal cancer.  Prostate cancer.  Skin cancer.  Lung cancer. What should I know about heart disease, diabetes, and high blood pressure? Blood pressure and heart disease  High blood pressure causes heart disease and increases the risk of stroke. This is more likely to develop in people who have high blood pressure readings, are of African descent, or are overweight.  Talk with your health care provider about your target blood pressure readings.  Have your blood pressure checked: ? Every 3-5 years if you are 41-12 years of age. ? Every year if you are 89 years old or older.  If you are between the ages of 47 and 46 and are a current or former smoker, ask your health care provider if you should have a one-time screening for abdominal aortic aneurysm (AAA). Diabetes Have regular diabetes screenings. This checks your fasting blood sugar level. Have the screening done:  Once every three years after age 12 if you are at a normal weight and have a low risk for diabetes.  More often and at a younger age if you are overweight or have a high risk for diabetes. What should I  know about preventing infection? Hepatitis B If you have a higher risk for hepatitis B, you should be screened for this virus. Talk with your health care provider to find out if you are at risk for hepatitis B infection. Hepatitis C Blood testing is recommended for:  Everyone born from [birth year] through 1965.  Anyone with known risk factors for hepatitis C. Sexually transmitted infections (STIs)  You should be screened each year for STIs, including gonorrhea  and chlamydia, if: ? You are sexually active and are younger than 49 years of age. ? You are older than 49 years of age and your health care provider tells you that you are at risk for this type of infection. ? Your sexual activity has changed since you were last screened, and you are at increased risk for chlamydia or gonorrhea. Ask your health care provider if you are at risk.  Ask your health care provider about whether you are at high risk for HIV. Your health care provider may recommend a prescription medicine to help prevent HIV infection. If you choose to take medicine to prevent HIV, you should first get tested for HIV. You should then be tested every 3 months for as long as you are taking the medicine. Follow these instructions at home: Lifestyle  Do not use any products that contain nicotine or tobacco, such as cigarettes, e-cigarettes, and chewing tobacco. If you need help quitting, ask your health care provider.  Do not use street drugs.  Do not share needles.  Ask your health care provider for help if you need support or information about quitting drugs. Alcohol use  Do not drink alcohol if your health care provider tells you not to drink.  If you drink alcohol: ? Limit how much you have to 0-2 drinks a day. ? Be aware of how much alcohol is in your drink. In the [place] one drink equals one 12 oz bottle of beer (355 mL), one 5 oz glass of wine (148 mL), or one 1 oz glass of hard liquor (44 mL). General instructions  Schedule regular health, dental, and eye exams.  Stay current with your vaccines.  Tell your health care provider if: ? You often feel depressed. ? You have ever been abused or do not feel safe at home. Summary  Adopting a healthy lifestyle and getting preventive care are important in promoting health and wellness.  Follow your health care provider's instructions about healthy diet, exercising, and getting tested or screened for diseases.  Follow your  health care provider's instructions on monitoring your cholesterol and blood pressure. This information is not intended to replace advice given to you by your health care provider. Make sure you discuss any questions you have with your health care provider. Document Revised: 10/31/2018 Document Reviewed: 10/31/2018 Elsevier Patient Education  2020 [employer].

## 2020-05-19 NOTE — Assessment & Plan Note (Signed)
Weight gain again noted.  Encouraged healthy diet and regular aerobic exercise for sustainable weight loss.

## 2020-05-19 NOTE — Assessment & Plan Note (Signed)
Preventative protocols reviewed and updated unless pt declined. Discussed healthy diet and lifestyle.  

## 2020-05-19 NOTE — Assessment & Plan Note (Signed)
With snoring endorsed by wife. With recent weight gain, suspect possible OSA as cause of daytime sleepiness. ESS = 11. Offered pulm eval for sleep study, he declines at this time, wants to focus on weight loss over the next few months. Discussed risks of untreated sleep apnea. Will let me know if desires to pursue OSA evaluation.

## 2020-05-19 NOTE — Assessment & Plan Note (Signed)
Chronic, stable. He has been taking celexa 10mg  bid - will increase to 20mg  daily in am. Will continue depakote 500mg  bid, with recommendation to try to decrease to QHS dosing given endorsed daytime somnolence, he has option to return to BID dosing if notes increased irritability or trouble managing work .

## 2020-06-04 ENCOUNTER — Telehealth: Payer: Self-pay

## 2020-06-04 NOTE — Telephone Encounter (Signed)
Tried to call patient. He has not come back in for his last two Euflexxa injections for the right knee. He received his first one on 05-12-2020 and has not been back. I left him a message to call us back to schedule those and to make sure that he did not have any complications from the injection that would have kept him from coming back for the last two of the series.

## 2020-06-22 ENCOUNTER — Other Ambulatory Visit: Payer: Self-pay | Admitting: Family Medicine

## 2020-06-22 NOTE — Telephone Encounter (Signed)
Electronic refill request. Depakote Last office visit:   05/19/2020  CPE Last Filled:

## 2020-07-06 ENCOUNTER — Telehealth: Payer: Self-pay | Admitting: Orthopaedic Surgery

## 2020-07-06 NOTE — Telephone Encounter (Signed)
Called and spoke with patient. Scheduled appointments.

## 2020-07-06 NOTE — Telephone Encounter (Signed)
Patient called.   He is requesting a call back to see when he is able to get another injection. [place] Spaniel he was approved for a series of three but doesn't know if they can be consecutive or the normal 6 months.   Call back: [phone]

## 2020-07-07 ENCOUNTER — Ambulatory Visit: Payer: 59 | Admitting: Orthopedic Surgery

## 2020-07-07 ENCOUNTER — Encounter: Payer: Self-pay | Admitting: Orthopedic Surgery

## 2020-07-07 ENCOUNTER — Other Ambulatory Visit: Payer: Self-pay

## 2020-07-07 VITALS — Ht 69.75 in | Wt 251.0 lb

## 2020-07-07 DIAGNOSIS — M1711 Unilateral primary osteoarthritis, right knee: Secondary | ICD-10-CM | POA: Diagnosis not present

## 2020-07-07 MED ORDER — SODIUM HYALURONATE (VISCOSUP) 20 MG/2ML IX SOSY
20.0000 mg | PREFILLED_SYRINGE | INTRA_ARTICULAR | Status: AC | PRN
  Administered 2020-07-07: 20 mg via INTRA_ARTICULAR

## 2020-07-07 MED ORDER — LIDOCAINE HCL 1 % IJ SOLN
2.0000 mL | INTRAMUSCULAR | Status: AC | PRN
  Administered 2020-07-07: 2 mL

## 2020-07-07 NOTE — Progress Notes (Signed)
Office Visit Note   Patient: Danny [name]           Date of Birth: 1971/04/01           MRN: [id number] Visit Date: 07/07/2020              Requested by: Eustaquio Boyden, MD [address] [place],  [place] [address] PCP: Eustaquio Boyden, MD   Assessment & Plan: Visit Diagnoses:  1. Primary osteoarthritis of right knee     Plan:  #1: Second Euflexxa was given to the right knee today without difficulty. #2: Follow back up in 1 week for the third injection.  Follow-Up Instructions: No follow-ups on file.   Orders:  No orders of the defined types were placed in this encounter.  No orders of the defined types were placed in this encounter.     Procedures: Large Joint Inj: R knee on 07/07/2020 2:10 PM Indications: pain and joint swelling Details: 25 G 1.5 in needle, anteromedial approach  Arthrogram: No  Medications: 2 mL lidocaine 1 %; 20 mg Sodium Hyaluronate 20 MG/2ML Outcome: tolerated well, no immediate complications Procedure, treatment alternatives, risks and benefits explained, specific risks discussed. Consent was given by the patient. Immediately prior to procedure a time out was called to verify the correct patient, procedure, equipment, support staff and site/side marked as required. Patient was prepped and draped in the usual sterile fashion.       Clinical Data: No additional findings.   Subjective: Chief Complaint  Patient presents with  . Right Knee - Follow-up    Euflexxa started 05/12/2020   HPI Patient presents today for the second Euflexxa injection in his right knee. He received his first injection on 05/12/2020, but unfortunately forgot to come back for the last two injections of the series.    Review of Systems  Constitutional: Negative for fatigue.  HENT: Negative for ear pain.   Eyes: Negative for pain.  Respiratory: Negative for shortness of breath.   Cardiovascular: Negative for leg swelling.  Gastrointestinal: Negative  for constipation and diarrhea.  Endocrine: Negative for cold intolerance and heat intolerance.  Genitourinary: Negative for difficulty urinating.  Musculoskeletal: Positive for joint swelling.  Skin: Negative for rash.  Allergic/Immunologic: Negative for food allergies.  Neurological: Negative for weakness.  Hematological: Does not bruise/bleed easily.  Psychiatric/Behavioral: Negative for sleep disturbance.     Objective: Vital Signs: Ht 5' 9.75" (1.772 m)   Wt 251 lb (113.9 kg)   BMI 36.27 kg/m   Physical Exam Constitutional:      Appearance: He is well-developed.  Eyes:     Pupils: Pupils are equal, round, and reactive to light.  Pulmonary:     Effort: Pulmonary effort is normal.  Skin:    General: Skin is warm and dry.  Neurological:     Mental Status: He is alert and oriented to person, place, and time.  Psychiatric:        Behavior: Behavior normal.     Ortho Exam  Exam today reveals the need to be benign.  Minimal effusion.  No warmth or erythema.  Specialty Comments:  No specialty comments available.  Imaging: No results found.   PMFS History: Current Outpatient Medications  Medication Sig Dispense Refill  . citalopram (CELEXA) 20 MG tablet Take 1 tablet (20 mg total) by mouth daily. 90 tablet 3  . divalproex (DEPAKOTE) 500 MG DR tablet Take 1 tablet (500 mg total) by mouth 2 (two) times daily. 180  tablet 3  . fluticasone (FLONASE) 50 MCG/ACT nasal spray Place 2 sprays into both nostrils daily. 16 g 6  . naproxen (NAPROSYN) 500 MG tablet Take 1 tablet (500 mg total) by mouth daily as needed. 50 tablet 0   No current facility-administered medications for this visit.    Patient Active Problem List   Diagnosis Date Noted  . Daytime somnolence 05/19/2020  . Acute sinusitis 06/27/2019  . Primary osteoarthritis of right knee 05/24/2019  . Family history of prostate cancer in father 02/03/2019  . Cough 11/04/2017  . Neuropathy 02/06/2017  .  Hypertriglyceridemia 10/08/2016  . History of alcohol abuse 07/15/2016  . Severe obesity (BMI 35.0-39.9) with comorbidity (HCC) 10/12/2015  . Health maintenance examination 10/12/2015  . OCD (obsessive compulsive disorder) 08/06/2015  . Chronic congestion of paranasal sinus 08/06/2015  . Bipolar 2 disorder [facility])    Past Medical History:  Diagnosis Date  . Anxiety   . Anxiety and depression    states controlled with meds  . COVID-19 virus infection 09/2019   tested positive  . Headache(784.0)    sinus headaches  . Impaired glucose tolerance    denies diabetes- states glucose elevated x 1 in past  . Multiple allergies    seansonal,environmental  . Perianal fistula, posterior midline 02/06/2012  . Pilonidal cyst     Family History  Problem Relation Age of Onset  . Cancer Father 63       prostate  . Anxiety disorder Father   . Depression Mother   . Heart disease Maternal Grandfather   . Cancer Paternal Grandfather        lung  . Colon cancer Neg Hx     Past Surgical History:  Procedure Laterality Date  . ANAL FISTULECTOMY  03/08/12   EUA  . ANTERIOR CRUCIATE LIGAMENT REPAIR  2008   right  . SHOULDER ARTHROSCOPY WITH ROTATOR CUFF REPAIR AND SUBACROMIAL DECOMPRESSION Right 12/10/2013   Procedure: SHOULDER ARTHROSCOPY WITH SUBACROMIAL DECOMPRESSION, DISTAL CALVICLE RESECTION, OPEN ROTATOR CUFF REPAIR.;  Surgeon: Valeria Batman, MD;  Location: [facility];  Service: Orthopedics;  Laterality: Right;  Marland Kitchen VASECTOMY  2000  . WISDOM TOOTH EXTRACTION     right  lower   Social History   Occupational History  . Not on file  Tobacco Use  . Smoking status: Former Smoker    Quit date: 11/21/2009    Years since quitting: 10.6  . Smokeless tobacco: Current User    Types: Chew  Substance and Sexual Activity  . Alcohol use: No    Alcohol/week: 0.0 standard drinks    Comment: 12 pk week--Quit Dec 2017  . Drug use: No  . Sexual activity: Not on file

## 2020-07-08 ENCOUNTER — Telehealth: Payer: Self-pay | Admitting: *Deleted

## 2020-07-08 NOTE — Telephone Encounter (Signed)
Patient left a voicemail stating that he is having another flare-up with his sinuses and usually has this about three times a year. Patient stated that he has a sinus headache, bad taste in his mouth and pressure in his sinuses. Patient stated that he would like an antibiotic sent in for him. Called patient back to triage and got his voicemail. Left a message for patient to call back.

## 2020-07-08 NOTE — Telephone Encounter (Signed)
Pt left v/m that he forgot to leave his contact # on the first v/m and pt left his best contact #. I spoke with pt; pt is going to do a virtual appt with his city doctor because [place] is [place] for the beach on 07[place]09[place]20 in the morning time. Pt will cb if needed. FYI to DR Para March.

## 2020-07-08 NOTE — Telephone Encounter (Signed)
Noted. Thanks.

## 2020-07-14 ENCOUNTER — Other Ambulatory Visit: Payer: Self-pay

## 2020-07-14 ENCOUNTER — Ambulatory Visit: Payer: 59 | Admitting: Orthopaedic Surgery

## 2020-07-14 ENCOUNTER — Encounter: Payer: Self-pay | Admitting: Orthopaedic Surgery

## 2020-07-14 VITALS — Ht 69.75 in | Wt 251.0 lb

## 2020-07-14 DIAGNOSIS — M1711 Unilateral primary osteoarthritis, right knee: Secondary | ICD-10-CM

## 2020-07-14 MED ORDER — SODIUM HYALURONATE (VISCOSUP) 20 MG/2ML IX SOSY
20.0000 mg | PREFILLED_SYRINGE | INTRA_ARTICULAR | Status: AC | PRN
  Administered 2020-07-14: 20 mg via INTRA_ARTICULAR

## 2020-07-14 MED ORDER — LIDOCAINE HCL 1 % IJ SOLN
2.0000 mL | INTRAMUSCULAR | Status: AC | PRN
  Administered 2020-07-14: 2 mL

## 2020-07-14 NOTE — Progress Notes (Signed)
Office Visit Note   Patient: Danny [name]           Date of Birth: [birth year]           MRN: [id number] Visit Date: 07/14/2020              Requested by: Eustaquio Boyden, MD [address] [place],  [place] [address] PCP: Eustaquio Boyden, MD   Assessment & Plan: Visit Diagnoses:  1. Primary osteoarthritis of right knee     Plan: #1: The third Euflexxa injection was given today without difficulty.  Tolerated the procedure well.  Follow-Up Instructions: No follow-ups on file.   Orders:  No orders of the defined types were placed in this encounter.  No orders of the defined types were placed in this encounter.     Procedures: Large Joint Inj: R knee on 07/14/2020 4:36 PM Indications: pain and joint swelling Details: 25 G 1.5 in needle, anteromedial approach  Arthrogram: No  Medications: 2 mL lidocaine 1 %; 20 mg Sodium Hyaluronate 20 MG/2ML Outcome: tolerated well, no immediate complications Procedure, treatment alternatives, risks and benefits explained, specific risks discussed. Consent was given by the patient. Immediately prior to procedure a time out was called to verify the correct patient, procedure, equipment, support staff and site/side marked as required. Patient was prepped and draped in the usual sterile fashion.       Clinical Data: No additional findings.   Subjective: Chief Complaint  Patient presents with  . Right Knee - Follow-up    Euflexxa started 04/24/2020  Patient presents today for the third Euflexxa injection in his right knee. He started the series on 04/24/2020 and came back last week for the second injection. He states that he has noticed some improvement.   HPI  Review of Systems  Constitutional: Negative for fatigue.  HENT: Negative for ear pain.   Eyes: Negative for pain.  Respiratory: Negative for shortness of breath.   Cardiovascular: Negative for leg swelling.  Gastrointestinal: Negative for constipation and diarrhea.   Endocrine: Negative for cold intolerance and heat intolerance.  Genitourinary: Negative for difficulty urinating.  Musculoskeletal: Positive for joint swelling.  Skin: Negative for rash.  Allergic/Immunologic: Negative for food allergies.  Neurological: Negative for weakness.  Hematological: Does not bruise/bleed easily.  Psychiatric/Behavioral: Negative for sleep disturbance.     Objective: Vital Signs: Ht 5' 9.75" (1.772 m)   Wt 251 lb (113.9 kg)   BMI 36.27 kg/m   Physical Exam  Ortho Exam  Exam today reveals the knee to be quite benign.  No warmth or erythema.  Trace effusion at best.  Specialty Comments:  No specialty comments available.  Imaging: No results found.   PMFS History: Current Outpatient Medications  Medication Sig Dispense Refill  . citalopram (CELEXA) 20 MG tablet Take 1 tablet (20 mg total) by mouth daily. 90 tablet 3  . divalproex (DEPAKOTE) 500 MG DR tablet Take 1 tablet (500 mg total) by mouth 2 (two) times daily. 180 tablet 3  . fluticasone (FLONASE) 50 MCG/ACT nasal spray Place 2 sprays into both nostrils daily. 16 g 6  . naproxen (NAPROSYN) 500 MG tablet Take 1 tablet (500 mg total) by mouth daily as needed. 50 tablet 0   No current facility-administered medications for this visit.    Patient Active Problem List   Diagnosis Date Noted  . Daytime somnolence 05/19/2020  . Acute sinusitis 06/27/2019  . Primary osteoarthritis of right knee 05/24/2019  . Family history of  prostate cancer in father 02/03/2019  . Cough 11/04/2017  . Neuropathy 02/06/2017  . Hypertriglyceridemia 10/08/2016  . History of alcohol abuse 07/15/2016  . Severe obesity (BMI 35.0-39.9) with comorbidity (HCC) 10/12/2015  . Health maintenance examination 10/12/2015  . OCD (obsessive compulsive disorder) 08/06/2015  . Chronic congestion of paranasal sinus 08/06/2015  . Bipolar 2 disorder [facility])    Past Medical History:  Diagnosis Date  . Anxiety   . Anxiety and  depression    states controlled with meds  . COVID-19 virus infection 09/2019   tested positive  . Headache(784.0)    sinus headaches  . Impaired glucose tolerance    denies diabetes- states glucose elevated x 1 in past  . Multiple allergies    seansonal,environmental  . Perianal fistula, posterior midline 02/06/2012  . Pilonidal cyst     Family History  Problem Relation Age of Onset  . Cancer Father 45       prostate  . Anxiety disorder Father   . Depression Mother   . Heart disease Maternal Grandfather   . Cancer Paternal Grandfather        lung  . Colon cancer Neg Hx     Past Surgical History:  Procedure Laterality Date  . ANAL FISTULECTOMY  03/08/12   EUA  . ANTERIOR CRUCIATE LIGAMENT REPAIR  2008   right  . SHOULDER ARTHROSCOPY WITH ROTATOR CUFF REPAIR AND SUBACROMIAL DECOMPRESSION Right 12/10/2013   Procedure: SHOULDER ARTHROSCOPY WITH SUBACROMIAL DECOMPRESSION, DISTAL CALVICLE RESECTION, OPEN ROTATOR CUFF REPAIR.;  Surgeon: Valeria Batman, MD;  Location: [facility];  Service: Orthopedics;  Laterality: Right;  Marland Kitchen VASECTOMY  2000  . WISDOM TOOTH EXTRACTION     right  lower   Social History   Occupational History  . Not on file  Tobacco Use  . Smoking status: Former Smoker    Quit date: 11/21/2009    Years since quitting: 10.6  . Smokeless tobacco: Current User    Types: Chew  Substance and Sexual Activity  . Alcohol use: No    Alcohol/week: 0.0 standard drinks    Comment: 12 pk week--Quit Dec 2017  . Drug use: No  . Sexual activity: Not on file

## 2020-07-21 ENCOUNTER — Telehealth: Payer: Self-pay | Admitting: *Deleted

## 2020-07-21 NOTE — Telephone Encounter (Signed)
Patient called requesting that an antibiotic be sent in for a sinus infection. Patient stated that he called last week and Dr. Sharen Hones was out of the office. See previous phone note.Patient stated that he has a headache,copngestion, cough, teeth hurt, and a bad taste in his mouth. Patient stated these symptoms stated last week. Patient stated that he is sure that he has a sinus infection. Patient scheduled for a virtual visit with Nicki Reaper NP tomorrow 07/22/20 at 2:00. Patient was given ER precautions and he verbalized understanding. Patient was advised that he should be tested for covid and he stated that he will try to get one done. .Patient stated that he has not had the covid vaccines.

## 2020-07-21 NOTE — Telephone Encounter (Signed)
Noted, will discuss at upcoming appt 

## 2020-07-22 ENCOUNTER — Encounter: Payer: 59 | Admitting: Family Medicine

## 2020-07-22 ENCOUNTER — Other Ambulatory Visit: Payer: Self-pay

## 2020-07-22 ENCOUNTER — Telehealth: Payer: 59 | Admitting: Internal Medicine

## 2020-07-22 NOTE — Telephone Encounter (Signed)
Pt left v/m that he missed the zoom appt today and request cb to reschedule due to sinus infection and h/a.

## 2020-07-31 NOTE — Telephone Encounter (Signed)
Pt's wife called requesting apt for pt. Pt c/o bad HA for 2-3 wks, head congestion and morning cough. Tammy reports pt has sinus issues 2-3 times yearly and this is normal for him. Tammy denies pt has any other symptoms or has been exposed to anyone that has covid.  She said he missed a VV recently and would like to RS that. RS the VV with PCP and advised if any symptoms worsen over the weekend to go to the [facility].

## 2020-07-31 NOTE — Telephone Encounter (Signed)
Noted. Will see then thanks.

## 2020-08-03 ENCOUNTER — Telehealth: Payer: Self-pay

## 2020-08-03 ENCOUNTER — Encounter: Payer: Self-pay | Admitting: Family Medicine

## 2020-08-03 ENCOUNTER — Other Ambulatory Visit: Payer: Self-pay

## 2020-08-03 ENCOUNTER — Telehealth (INDEPENDENT_AMBULATORY_CARE_PROVIDER_SITE_OTHER): Payer: 59 | Admitting: Family Medicine

## 2020-08-03 VITALS — BP 140/76 | HR 77 | Temp 98.6°F | Ht 69.75 in | Wt 250.0 lb

## 2020-08-03 DIAGNOSIS — J0110 Acute frontal sinusitis, unspecified: Secondary | ICD-10-CM

## 2020-08-03 DIAGNOSIS — J329 Chronic sinusitis, unspecified: Secondary | ICD-10-CM | POA: Diagnosis not present

## 2020-08-03 MED ORDER — FLUTICASONE PROPIONATE 50 MCG/ACT NA SUSP
2.0000 | Freq: Every day | NASAL | 6 refills | Status: DC
Start: 2020-08-03 — End: 2022-12-19

## 2020-08-03 MED ORDER — AMOXICILLIN-POT CLAVULANATE 875-125 MG PO TABS: 1.0000 | ORAL_TABLET | Freq: Two times a day (BID) | ORAL | 0 refills | Status: AC

## 2020-08-03 NOTE — Assessment & Plan Note (Addendum)
He has been able to back off afrin nasal spray - using flonase PRN in its place.

## 2020-08-03 NOTE — Telephone Encounter (Signed)
Lvm for asking pt to call back.  I was trying to get him ready for virtual visit today at 2:00.  I will call back after lunch.

## 2020-08-03 NOTE — Progress Notes (Signed)
Virtual visit completed through MyChart, a video enabled telemedicine application. Due to national recommendations of social distancing due to COVID-19, a virtual visit is felt to be most appropriate for this patient at this time. Reviewed limitations, risks, security and privacy concerns of performing a virtual visit and the availability of in person appointments. I also reviewed that there may be a patient responsible charge related to this service. The patient agreed to proceed.   Patient location: at work  Provider location: Adult nurse at [facility], office Persons participating in this virtual visit: patient, provider   If any vitals were documented, they were collected by patient at home unless specified below.    BP 140/76   Pulse 77   Temp 98.6 F (37 C)   Ht 5' 9.75" (1.772 m)   Wt 250 lb (113.4 kg)   BMI 36.13 kg/m   BP Readings from Last 3 Encounters:  08/03/20 140/76  05/19/20 124/68  05/14/19 124/72    CC: sinus pressure Subjective:    Patient ID: Danny [name], male    DOB: Feb 08, 1971, 49 y.o.   MRN: [id number]  HPI: Danny [name] is a 49 y.o. male presenting on 08/03/2020 for Sinus Problem (C/o sinus pressure in face, HA, bad taste in mouth and cough.  Sxs started 5-6 days ago. )   6d h/o frontal sinus pressure headache, bad taste in mouth. Some PNdrainage. Some tooth pain. Blowing nose with some colored mucous. Mowing lawn triggers sinus symptoms.  Similar symptoms last month that improved with flonase and nasal saline.  No sick contacts at home.   No fevers/chills, loss of taste or smell, cough or dyspnea, ST, abd pain, nausea, diarrhea. No ear pain.   Tylenol sinus not helping.  Non smoker.  No h/o asthma.   Did have COVID back in 09/2019.  Has not had COVID vaccine. I encouraged he get this.   H/o sinus infections - this feels similar.      Relevant past medical, surgical, family and social history reviewed and updated as indicated. Interim medical  history since our last visit reviewed. Allergies and medications reviewed and updated. Outpatient Medications Prior to Visit  Medication Sig Dispense Refill  . citalopram (CELEXA) 20 MG tablet Take 1 tablet (20 mg total) by mouth daily. 90 tablet 3  . divalproex (DEPAKOTE) 500 MG DR tablet Take 1 tablet (500 mg total) by mouth 2 (two) times daily. 180 tablet 3  . naproxen (NAPROSYN) 500 MG tablet Take 1 tablet (500 mg total) by mouth daily as needed. 50 tablet 0  . fluticasone (FLONASE) 50 MCG/ACT nasal spray Place 2 sprays into both nostrils daily. 16 g 6   No facility-administered medications prior to visit.     Per HPI unless specifically indicated in ROS section below Review of Systems Objective:  BP 140/76   Pulse 77   Temp 98.6 F (37 C)   Ht 5' 9.75" (1.772 m)   Wt 250 lb (113.4 kg)   BMI 36.13 kg/m   Wt Readings from Last 3 Encounters:  08/03/20 250 lb (113.4 kg)  07/14/20 251 lb (113.9 kg)  07/07/20 251 lb (113.9 kg)      Physical exam: Gen: alert, NAD, not ill appearing Pulm: speaks in complete sentences without increased work of breathing Psych: normal mood, normal thought content      Assessment & Plan:   Problem List Items Addressed This Visit    Chronic congestion of paranasal sinus    He  has been able to back off afrin nasal spray - using flonase PRN in its place.       Acute sinus infection - Primary    Anticipate acute sinusitis given duration and progression of symptoms (initial symptoms were last month). Rx augmentin antibiotic  Continue flonase. Discussed tylenol sinus use.  Update if not improving with treatment.       Relevant Medications   fluticasone (FLONASE) 50 MCG/ACT nasal spray   amoxicillin-clavulanate (AUGMENTIN) 875-125 MG tablet       Meds ordered this encounter  Medications  . fluticasone (FLONASE) 50 MCG/ACT nasal spray    Sig: Place 2 sprays into both nostrils daily.    Dispense:  16 g    Refill:  6  .  amoxicillin-clavulanate (AUGMENTIN) 875-125 MG tablet    Sig: Take 1 tablet by mouth 2 (two) times daily for 10 days.    Dispense:  20 tablet    Refill:  0   No orders of the defined types were placed in this encounter.   I discussed the assessment and treatment plan with the patient. The patient was provided an opportunity to ask questions and all were answered. The patient agreed with the plan and demonstrated an understanding of the instructions. The patient was advised to [name] back or seek an in-person evaluation if the symptoms worsen or if the condition fails to improve as anticipated.  Follow up plan: No follow-ups on file.  Eustaquio Boyden, MD

## 2020-08-03 NOTE — Assessment & Plan Note (Addendum)
Anticipate acute sinusitis given duration and progression of symptoms (initial symptoms were last month). Rx augmentin antibiotic  Continue flonase. Discussed tylenol sinus use.  Update if not improving with treatment.

## 2020-08-04 NOTE — Telephone Encounter (Signed)
Pt seen for visit

## 2020-09-30 NOTE — Progress Notes (Signed)
This encounter was created in error - please disregard.

## 2020-12-01 ENCOUNTER — Other Ambulatory Visit: Payer: Self-pay

## 2020-12-01 ENCOUNTER — Other Ambulatory Visit: Payer: Self-pay | Admitting: Orthopaedic Surgery

## 2020-12-01 ENCOUNTER — Telehealth: Payer: Self-pay | Admitting: Orthopaedic Surgery

## 2020-12-01 ENCOUNTER — Other Ambulatory Visit: Payer: Self-pay | Admitting: Family Medicine

## 2020-12-01 DIAGNOSIS — M1711 Unilateral primary osteoarthritis, right knee: Secondary | ICD-10-CM

## 2020-12-01 MED ORDER — TRAMADOL HCL 50 MG PO TABS: 50.0000 mg | ORAL_TABLET | Freq: Three times a day (TID) | ORAL | 1 refills | Status: DC | PRN

## 2020-12-01 NOTE — Telephone Encounter (Signed)
Pt called stating his R knee has been bothering him again so he set an appt for 12/08/20 but he would like to have something called in for the pain until then. He did state that he tried tylenol and Advil and neither worked; he would like a CB to update him on what Dr.Whitfield decides to send in

## 2020-12-01 NOTE — Telephone Encounter (Signed)
Will send in tramadol-would also like an MRI of the right knee before he returns to the office-please call

## 2020-12-01 NOTE — Telephone Encounter (Signed)
Spoke with patient. He is aware of the plan that Dr.Whitfield has ordered.

## 2020-12-08 ENCOUNTER — Ambulatory Visit: Payer: 59 | Admitting: Orthopaedic Surgery

## 2021-01-07 ENCOUNTER — Ambulatory Visit
Admission: RE | Admit: 2021-01-07 | Discharge: 2021-01-07 | Disposition: A | Discharge status: Home / Self Care | Payer: 59 | Source: Ambulatory Visit | Attending: Orthopaedic Surgery | Admitting: Orthopaedic Surgery

## 2021-01-07 DIAGNOSIS — M1711 Unilateral primary osteoarthritis, right knee: Secondary | ICD-10-CM

## 2021-01-12 ENCOUNTER — Ambulatory Visit: Payer: 59 | Admitting: Orthopaedic Surgery

## 2021-01-12 ENCOUNTER — Encounter: Payer: Self-pay | Admitting: Orthopaedic Surgery

## 2021-01-12 ENCOUNTER — Other Ambulatory Visit: Payer: Self-pay

## 2021-01-12 VITALS — Ht 69.75 in | Wt 250.0 lb

## 2021-01-12 DIAGNOSIS — M1711 Unilateral primary osteoarthritis, right knee: Secondary | ICD-10-CM

## 2021-01-12 NOTE — Progress Notes (Signed)
Office Visit Note   Patient: Danny [name]           Date of Birth: July 31, 1971           MRN: [id number] Visit Date: 01/12/2021              Requested by: Eustaquio Boyden, MD [address] [place],  [place] [address] PCP: Eustaquio Boyden, MD   Assessment & Plan: Visit Diagnoses:  1. Primary osteoarthritis of right knee     Plan: Mr. Bingaman had an MRI scan of his right knee demonstrating a prior ACL reconstruction with a graft intact.  Surgery was performed about 14 years ago.  Collateral ligaments are intact.  He does have progressive chondral thinning in all 3 compartments.  No evidence of meniscal pathology.  In essence he has tricompartmental degenerative arthritis.  Presently doing very well but can certainly expect to have recurrent episodes of pain soreness and swelling.  I think it was relieved to know that there was no other pathology other than the arthritis.  We will plan to see him back as needed.  He can always consider cortisone or viscosupplementation.  He is not at the point where he would consider knee replacement  Follow-Up Instructions: Return if symptoms worsen or fail to improve.   Orders:  No orders of the defined types were placed in this encounter.  No orders of the defined types were placed in this encounter.     Procedures: No procedures performed   Clinical Data: No additional findings.   Subjective: Chief Complaint  Patient presents with  . Right Knee - Follow-up    MRI review  Patient presents today for follow up on his right knee. He had an MRI performed and is here today to go over those results.   HPI  Review of Systems   Objective: Vital Signs: Ht 5' 9.75" (1.772 m)   Wt 250 lb (113.4 kg)   BMI 36.13 kg/m   Physical Exam Constitutional:      Appearance: He is well-developed and well-nourished.  HENT:     Mouth/Throat:     Mouth: Oropharynx is clear and moist.  Eyes:     Extraocular Movements: EOM normal.      Pupils: Pupils are equal, round, and reactive to light.  Pulmonary:     Effort: Pulmonary effort is normal.  Skin:    General: Skin is warm and dry.  Neurological:     Mental Status: He is alert and oriented to person, place, and time.  Psychiatric:        Mood and Affect: Mood and affect normal.        Behavior: Behavior normal.     Ortho Exam minimal effusion right knee.  No localized tenderness.  No opening with varus valgus stress.  Little bit of medial joint pain.  No pain laterally or in the patellar region.  Full extension of flexed over 100 degrees without instability.  No calf pain  Specialty Comments:  No specialty comments available.  Imaging: No results found.   PMFS History: Patient Active Problem List   Diagnosis Date Noted  . Daytime somnolence 05/19/2020  . Acute sinus infection 06/27/2019  . Primary osteoarthritis of right knee 05/24/2019  . Family history of prostate cancer in father 02/03/2019  . Cough 11/04/2017  . Neuropathy 02/06/2017  . Hypertriglyceridemia 10/08/2016  . History of alcohol abuse 07/15/2016  . Severe obesity (BMI 35.0-39.9) with comorbidity (HCC) 10/12/2015  .  Health maintenance examination 10/12/2015  . OCD (obsessive compulsive disorder) 08/06/2015  . Chronic congestion of paranasal sinus 08/06/2015  . Bipolar 2 disorder [facility])    Past Medical History:  Diagnosis Date  . Anxiety   . Anxiety and depression    states controlled with meds  . COVID-19 virus infection 09/2019   tested positive  . Headache(784.0)    sinus headaches  . Impaired glucose tolerance    denies diabetes- states glucose elevated x 1 in past  . Multiple allergies    seansonal,environmental  . Perianal fistula, posterior midline 02/06/2012  . Pilonidal cyst     Family History  Problem Relation Age of Onset  . Cancer Father 64       prostate  . Anxiety disorder Father   . Depression Mother   . Heart disease Maternal Grandfather   . Cancer Paternal  Grandfather        lung  . Colon cancer Neg Hx     Past Surgical History:  Procedure Laterality Date  . ANAL FISTULECTOMY  03/08/12   EUA  . ANTERIOR CRUCIATE LIGAMENT REPAIR  2008   right  . SHOULDER ARTHROSCOPY WITH ROTATOR CUFF REPAIR AND SUBACROMIAL DECOMPRESSION Right 12/10/2013   Procedure: SHOULDER ARTHROSCOPY WITH SUBACROMIAL DECOMPRESSION, DISTAL CALVICLE RESECTION, OPEN ROTATOR CUFF REPAIR.;  Surgeon: Valeria Batman, MD;  Location: [facility];  Service: Orthopedics;  Laterality: Right;  Marland Kitchen VASECTOMY  2000  . WISDOM TOOTH EXTRACTION     right  lower   Social History   Occupational History  . Not on file  Tobacco Use  . Smoking status: Former Smoker    Quit date: 11/21/2009    Years since quitting: 11.1  . Smokeless tobacco: Current User    Types: Chew  Substance and Sexual Activity  . Alcohol use: No    Alcohol/week: 0.0 standard drinks    Comment: 12 pk week--Quit Dec 2017  . Drug use: No  . Sexual activity: Not on file

## 2021-01-28 ENCOUNTER — Other Ambulatory Visit (INDEPENDENT_AMBULATORY_CARE_PROVIDER_SITE_OTHER): Payer: Self-pay | Admitting: Orthopedic Surgery

## 2021-01-28 NOTE — Telephone Encounter (Signed)
Ok to prescribe

## 2021-02-23 ENCOUNTER — Encounter: Payer: Self-pay | Admitting: Family Medicine

## 2021-02-24 MED ORDER — HYDROXYZINE HCL 25 MG PO TABS: 12.5000 mg | ORAL_TABLET | Freq: Two times a day (BID) | ORAL | 0 refills | Status: DC | PRN

## 2021-04-10 ENCOUNTER — Other Ambulatory Visit: Payer: Self-pay | Admitting: Orthopaedic Surgery

## 2021-04-12 ENCOUNTER — Other Ambulatory Visit: Payer: Self-pay | Admitting: Family Medicine

## 2021-04-12 NOTE — Telephone Encounter (Signed)
Would you mind refilling this for Dr.Whitfield?

## 2021-04-13 NOTE — Telephone Encounter (Signed)
Lvm asking pt to call back.  According to our records pt has not been on naltrexone over a year and is not on current med list.  Need to know if pt is requesting refill or if automatically sent by the pharmacy.  

## 2021-04-14 ENCOUNTER — Other Ambulatory Visit: Payer: Self-pay | Admitting: Family Medicine

## 2021-04-14 ENCOUNTER — Other Ambulatory Visit: Payer: 59

## 2021-04-14 DIAGNOSIS — E781 Pure hyperglyceridemia: Secondary | ICD-10-CM

## 2021-04-14 DIAGNOSIS — F3181 Bipolar II disorder: Secondary | ICD-10-CM

## 2021-04-14 DIAGNOSIS — Z8042 Family history of malignant neoplasm of prostate: Secondary | ICD-10-CM

## 2021-04-15 NOTE — Telephone Encounter (Signed)
Lvm asking pt to call back.  According to our records pt has not been on naltrexone over a year and is not on current med list.  Need to know if pt is requesting refill or if automatically sent by the pharmacy.

## 2021-04-20 NOTE — Telephone Encounter (Signed)
Naltrexone Last OV:  05/19/20, CPE Next OV:  05/21/21,  CPE

## 2021-04-20 NOTE — Addendum Note (Signed)
Addended by: Nanci Pina on: 04/20/2021 02:45 PM   Modules accepted: Orders

## 2021-04-20 NOTE — Telephone Encounter (Signed)
Relayed the message to the patient and he is actually the one requesting the medicine. Please Advise. EM

## 2021-04-21 MED ORDER — NALTREXONE HCL 50 MG PO TABS: 50.0000 mg | ORAL_TABLET | Freq: Every day | ORAL | 0 refills | Status: DC

## 2021-04-21 NOTE — Telephone Encounter (Signed)
ERx 

## 2021-05-11 ENCOUNTER — Other Ambulatory Visit: Payer: 59

## 2021-05-11 ENCOUNTER — Telehealth: Payer: Self-pay

## 2021-05-11 NOTE — Telephone Encounter (Signed)
[facility] Night - Client Nonclinical Telephone Record AccessNurse Client [facility] Night - Client Client Site [facility] - Night Physician [facility] - PHYSICIAN, NOT LISTED- MD Contact Type Call Who Is Calling Patient / Member / Family / Caregiver Caller Name Tatsuo Musial Caller Phone Number [phone] Patient Name Danny [name] Patient DOB [birth year] Call Type Message Only Information Provided Reason for Call Request to Reschedule Office Appointment Initial Comment Caller states he had bloodwork scheduled and needs to reschedule due to eating breakfast. Patient request to speak to RN No Disp. Time Disposition Final User 05/11/2021 7:17:38 AM General Information Provided Yes Lynnell Grain Call Closed By: Lynnell Grain Transaction Date/Time: 05/11/2021 7:14:34 AM (ET)

## 2021-05-11 NOTE — Telephone Encounter (Signed)
Per appt notes already rescheduled.

## 2021-05-14 ENCOUNTER — Other Ambulatory Visit (INDEPENDENT_AMBULATORY_CARE_PROVIDER_SITE_OTHER): Payer: 59

## 2021-05-14 ENCOUNTER — Other Ambulatory Visit: Payer: Self-pay

## 2021-05-14 DIAGNOSIS — E781 Pure hyperglyceridemia: Secondary | ICD-10-CM | POA: Diagnosis not present

## 2021-05-14 DIAGNOSIS — F3181 Bipolar II disorder: Secondary | ICD-10-CM | POA: Diagnosis not present

## 2021-05-14 DIAGNOSIS — Z8042 Family history of malignant neoplasm of prostate: Secondary | ICD-10-CM

## 2021-05-14 LAB — LIPID PANEL
Cholesterol: 217 mg/dL — ABNORMAL HIGH (ref 0–200)
HDL: 38.2 mg/dL — ABNORMAL LOW (ref >39.00)
NonHDL: 179.28
Total CHOL/HDL Ratio: 6
Triglycerides: 216 mg/dL — ABNORMAL HIGH (ref 0.0–149.0)
VLDL: 43.2 mg/dL — ABNORMAL HIGH (ref 0.0–40.0)

## 2021-05-14 LAB — COMPREHENSIVE METABOLIC PANEL
ALT: 19 U/L (ref 0–53)
AST: 16 U/L (ref 0–37)
Albumin: 4.5 g/dL (ref 3.5–5.2)
Alkaline Phosphatase: 58 U/L (ref 39–117)
BUN: 14 mg/dL (ref 6–23)
CO2: 30 mEq/L (ref 19–32)
Calcium: 9.4 mg/dL (ref 8.4–10.5)
Chloride: 100 mEq/L (ref 96–112)
Creatinine, Ser: 0.69 mg/dL (ref 0.40–1.50)
GFR: 108.37 mL/min (ref >60.00)
Glucose, Bld: 94 mg/dL (ref 70–99)
Potassium: 4.6 mEq/L (ref 3.5–5.1)
Sodium: 137 mEq/L (ref 135–145)
Total Bilirubin: 0.5 mg/dL (ref 0.2–1.2)
Total Protein: 6.8 g/dL (ref 6.0–8.3)

## 2021-05-14 LAB — CBC WITH DIFFERENTIAL/PLATELET
Basophils Absolute: 0.1 10*3/uL (ref 0.0–0.1)
Basophils Relative: 1.3 % (ref 0.0–3.0)
Eosinophils Absolute: 0.1 10*3/uL (ref 0.0–0.7)
Eosinophils Relative: 2.9 % (ref 0.0–5.0)
HCT: 45.8 % (ref 39.0–52.0)
Hemoglobin: 16 g/dL (ref 13.0–17.0)
Lymphocytes Relative: 35.3 % (ref 12.0–46.0)
Lymphs Abs: 1.5 10*3/uL (ref 0.7–4.0)
MCHC: 34.9 g/dL (ref 30.0–36.0)
MCV: 89.1 fl (ref 78.0–100.0)
Monocytes Absolute: 0.5 10*3/uL (ref 0.1–1.0)
Monocytes Relative: 11.3 % (ref 3.0–12.0)
Neutro Abs: 2.1 10*3/uL (ref 1.4–7.7)
Neutrophils Relative %: 49.2 % (ref 43.0–77.0)
Platelets: 189 10*3/uL (ref 150.0–400.0)
RBC: 5.13 Mil/uL (ref 4.22–5.81)
RDW: 13.5 % (ref 11.5–15.5)
WBC: 4.3 10*3/uL (ref 4.0–10.5)

## 2021-05-14 LAB — LDL CHOLESTEROL, DIRECT: Direct LDL: 150 mg/dL

## 2021-05-14 LAB — PSA: PSA: 0.52 ng/mL (ref 0.10–4.00)

## 2021-05-15 LAB — VALPROIC ACID LEVEL: Valproic Acid Lvl: 42.1 mg/L — ABNORMAL LOW (ref 50.0–100.0)

## 2021-05-20 ENCOUNTER — Encounter: Payer: 59 | Admitting: Family Medicine

## 2021-05-21 ENCOUNTER — Encounter: Payer: 59 | Admitting: Family Medicine

## 2021-05-27 ENCOUNTER — Other Ambulatory Visit: Payer: Self-pay | Admitting: Family Medicine

## 2021-05-27 NOTE — Telephone Encounter (Signed)
Depakote Last filled:  05/03/21, #60 Last OV:  08/03/20, acute sinusitis Next OV:  06/11/21, CPE

## 2021-06-11 ENCOUNTER — Encounter: Payer: Self-pay | Admitting: Family Medicine

## 2021-06-11 ENCOUNTER — Ambulatory Visit (INDEPENDENT_AMBULATORY_CARE_PROVIDER_SITE_OTHER): Payer: 59 | Admitting: Family Medicine

## 2021-06-11 ENCOUNTER — Other Ambulatory Visit: Payer: Self-pay

## 2021-06-11 VITALS — BP 140/78 | HR 85 | Temp 98.1°F | Ht 69.75 in | Wt 239.0 lb

## 2021-06-11 DIAGNOSIS — Z1211 Encounter for screening for malignant neoplasm of colon: Secondary | ICD-10-CM | POA: Diagnosis not present

## 2021-06-11 DIAGNOSIS — F1011 Alcohol abuse, in remission: Secondary | ICD-10-CM

## 2021-06-11 DIAGNOSIS — Z8042 Family history of malignant neoplasm of prostate: Secondary | ICD-10-CM

## 2021-06-11 DIAGNOSIS — E781 Pure hyperglyceridemia: Secondary | ICD-10-CM

## 2021-06-11 DIAGNOSIS — R400 Somnolence: Secondary | ICD-10-CM

## 2021-06-11 DIAGNOSIS — Z0000 Encounter for general adult medical examination without abnormal findings: Secondary | ICD-10-CM | POA: Diagnosis not present

## 2021-06-11 DIAGNOSIS — M1711 Unilateral primary osteoarthritis, right knee: Secondary | ICD-10-CM

## 2021-06-11 DIAGNOSIS — E669 Obesity, unspecified: Secondary | ICD-10-CM

## 2021-06-11 DIAGNOSIS — F3181 Bipolar II disorder: Secondary | ICD-10-CM | POA: Diagnosis not present

## 2021-06-11 MED ORDER — TRAMADOL HCL 50 MG PO TABS: 50.0000 mg | ORAL_TABLET | Freq: Two times a day (BID) | ORAL | 1 refills | Status: DC | PRN

## 2021-06-11 MED ORDER — DIVALPROEX SODIUM 500 MG PO DR TAB: 500.0000 mg | DELAYED_RELEASE_TABLET | Freq: Two times a day (BID) | ORAL | 3 refills | Status: DC

## 2021-06-11 MED ORDER — CITALOPRAM HYDROBROMIDE 20 MG PO TABS: 20.0000 mg | ORAL_TABLET | Freq: Every day | ORAL | 3 refills | Status: DC

## 2021-06-11 MED ORDER — NAPROXEN 500 MG PO TABS: 500.0000 mg | ORAL_TABLET | Freq: Two times a day (BID) | ORAL | 3 refills | Status: DC | PRN

## 2021-06-11 MED ORDER — HYDROXYZINE HCL 25 MG PO TABS: 12.5000 mg | ORAL_TABLET | Freq: Two times a day (BID) | ORAL | 3 refills | Status: DC | PRN

## 2021-06-11 NOTE — Assessment & Plan Note (Signed)
Chronic, stable off medication. Discussed diet choices to improve lipid levels. The 10-year ASCVD risk score Denman George DC Montez Hageman., et al., 2013) is: 5.5%   Values used to calculate the score:     Age: 50 years     Sex: Male     Is Non-Hispanic African American: No     Diabetic: No     Tobacco smoker: No     Systolic Blood Pressure: 140 mmHg     Is BP treated: No     HDL Cholesterol: 38.2 mg/dL     Total Cholesterol: 217 mg/dL

## 2021-06-11 NOTE — Assessment & Plan Note (Signed)
Preventative protocols reviewed and updated unless pt declined. Discussed healthy diet and lifestyle.  

## 2021-06-11 NOTE — Patient Instructions (Addendum)
We will refer you for colonoscopy.  Check on latest tetanus shot at work and let us know dates.  Work on more fatty fish in diet, more fiber and legumes, less fried greasy foods, less sugar in the diet to help cholesterol levels.  You are doing well today  Return as needed or in 1 year for next physical .  Health Maintenance, Male Adopting a healthy lifestyle and getting preventive care are important in promoting health and wellness. Ask your health care provider about: The right schedule for you to have regular tests and exams. Things you can do on your own to prevent diseases and keep yourself healthy. What should I know about diet, weight, and exercise? Eat a healthy diet  Eat a diet that includes plenty of vegetables, fruits, low-fat dairy products, and lean protein. Do not eat a lot of foods that are high in solid fats, added sugars, or sodium.  Maintain a healthy weight Body mass index (BMI) is a measurement that can be used to identify possible weight problems. It estimates body fat based on height and weight. Your health care provider can help determine your BMI and help you achieve or maintain ahealthy weight. Get regular exercise Get regular exercise. This is one of the most important things you can do for your health. Most adults should: Exercise for at least 150 minutes each week. The exercise should increase your heart rate and make you sweat (moderate-intensity exercise). Do strengthening exercises at least twice a week. This is in addition to the moderate-intensity exercise. Spend less time sitting. Even light physical activity can be beneficial. Watch cholesterol and blood lipids Have your blood tested for lipids and cholesterol at 50 years of age, then havethis test every 5 years. You may need to have your cholesterol levels checked more often if: Your lipid or cholesterol levels are high. You are older than 50 years of age. You are at high risk for heart disease. What  should I know about cancer screening? Many types of cancers can be detected early and may often be prevented. Depending on your health history and family history, you may need to have cancer screening at various ages. This may include screening for: Colorectal cancer. Prostate cancer. Skin cancer. Lung cancer. What should I know about heart disease, diabetes, and high blood pressure? Blood pressure and heart disease High blood pressure causes heart disease and increases the risk of stroke. This is more likely to develop in people who have high blood pressure readings, are of African descent, or are overweight. Talk with your health care provider about your target blood pressure readings. Have your blood pressure checked: Every 3-5 years if you are 55-62 years of age. Every year if you are 64 years old or older. If you are between the ages of 74 and 68 and are a current or former smoker, ask your health care provider if you should have a one-time screening for abdominal aortic aneurysm (AAA). Diabetes Have regular diabetes screenings. This checks your fasting blood sugar level. Have the screening done: Once every three years after age 32 if you are at a normal weight and have a low risk for diabetes. More often and at a younger age if you are overweight or have a high risk for diabetes. What should I know about preventing infection? Hepatitis B If you have a higher risk for hepatitis B, you should be screened for this virus. Talk with your health care provider to find out if you are  at risk forhepatitis B infection. Hepatitis C Blood testing is recommended for: Everyone born from [birth year] through 1965. Anyone with known risk factors for hepatitis C. Sexually transmitted infections (STIs) You should be screened each year for STIs, including gonorrhea and chlamydia, if: You are sexually active and are younger than 50 years of age. You are older than 50 years of age and your health care provider  tells you that you are at risk for this type of infection. Your sexual activity has changed since you were last screened, and you are at increased risk for chlamydia or gonorrhea. Ask your health care provider if you are at risk. Ask your health care provider about whether you are at high risk for HIV. Your health care provider may recommend a prescription medicine to help prevent HIV infection. If you choose to take medicine to prevent HIV, you should first get tested for HIV. You should then be tested every 3 months for as long as you are taking the medicine. Follow these instructions at home: Lifestyle Do not use any products that contain nicotine or tobacco, such as cigarettes, e-cigarettes, and chewing tobacco. If you need help quitting, ask your health care provider. Do not use street drugs. Do not share needles. Ask your health care provider for help if you need support or information about quitting drugs. Alcohol use Do not drink alcohol if your health care provider tells you not to drink. If you drink alcohol: Limit how much you have to 0-2 drinks a day. Be aware of how much alcohol is in your drink. In the [place] one drink equals one 12 oz bottle of beer (355 mL), one 5 oz glass of wine (148 mL), or one 1 oz glass of hard liquor (44 mL). General instructions Schedule regular health, dental, and eye exams. Stay current with your vaccines. Tell your health care provider if: You often feel depressed. You have ever been abused or do not feel safe at home. Summary Adopting a healthy lifestyle and getting preventive care are important in promoting health and wellness. Follow your health care provider's instructions about healthy diet, exercising, and getting tested or screened for diseases. Follow your health care provider's instructions on monitoring your cholesterol and blood pressure. This information is not intended to replace advice given to you by your health care provider. Make  sure you discuss any questions you have with your healthcare provider. Document Revised: 10/31/2018 Document Reviewed: 10/31/2018 Elsevier Patient Education  2022 [employer].

## 2021-06-11 NOTE — Assessment & Plan Note (Signed)
Ongoing without significant snoring or witnessed apnea. Declines OSA eval.

## 2021-06-11 NOTE — Assessment & Plan Note (Signed)
Rare alcohol intake - tries to avoid.  Continues naltrexone PRN.  Discussed not taking naltrexone and tramadol together.

## 2021-06-11 NOTE — Assessment & Plan Note (Addendum)
Chronic, stable period on celexa 20mg  daily with depakote 500mg  bid and hydroxyzine PRN.  Presumed bipolar 2. Never manic. Has declined psych eval. PHQ9/GAD7 reviewed.  MDQ negative.

## 2021-06-11 NOTE — Progress Notes (Signed)
Patient ID: Danny [name], male    DOB: [birth year], 50 y.o.   MRN: [id number]  This visit was conducted in person.  BP 140/78 (BP Location: Left Arm, Patient Position: Sitting, Cuff Size: Normal)   Pulse 85   Temp 98.1 F (36.7 C) (Temporal)   Ht 5' 9.75" (1.772 m)   Wt 239 lb (108.4 kg)   SpO2 95%   BMI 34.54 kg/m   BP Readings from Last 3 Encounters:  06/11/21 140/78  08/03/20 140/76  05/19/20 124/68   CC: CPE  Subjective:   HPI: Danny [name] is a 50 y.o. male presenting on 06/11/2021 for Annual Exam (Would like refill on Tramadol)   Upcoming 50yo bd.  Presumed bipolar 2 - stable on depakote 500mg  bid and celexa 20mg  daily with hydroxyzine PRN. Has declined psych eval. Increased stressors at work. Never manic symptoms.   Ongoing knee osteoarthritis s/p steroid injections, gel injection. Takes naprosyn PRN. Uses voltaren gel and CBD cream with benefit as well.   H/o alcohol abuse - uses naltrexone 50mg  prn. Off and on drinking. Continues to participate in drinking cessation program.    Suspicion for sleep apnea - declines pulm eval (snoring, daytime somnolence, no witnessed apnea or PNDyspnea).   11 lb weight loss - eating healthier, more water, more activity   Preventative: Colon cancer screening - requests colonoscopy - referral placed Prostate cancer screening - discussed. Fmhx prostate cancer (father age 83). No nocturia or weak stream.  Lung cancer screening - not eligible  Flu shot - declines COVID vaccine - declines  Tdap 2010. He did have Tetanus shot at work. Will check at work.  Seat belt use discussed.  Sunscreen use discussed. No changing moles on skin. Ex smoker. Quit 2011, some chewing.  Alcohol abuse - largely abstinent since 09/2019, occasional drinks intermittently but nothing regular  Dentist q6 mo Eye exam overdue - using readers   From [place] Married, 1992 Lives with wife and 2 kids (daughter 10/2019, son Danny [name])  Occ: Works for [employer] (contracting) Activity: no regular exercise, walking some, yard work  Diet: some milk, good water, some fruits/vegetables      Relevant past medical, surgical, family and social history reviewed and updated as indicated. Interim medical history since our last visit reviewed. Allergies and medications reviewed and updated. Outpatient Medications Prior to Visit  Medication Sig Dispense Refill   diclofenac Sodium (VOLTAREN) 1 % GEL APPLY 4 GRAMS TO AFFECTED AREA 4 TIMES A DAY. 400 g 3   fluticasone (FLONASE) 50 MCG/ACT nasal spray Place 2 sprays into both nostrils daily. 16 g 6   naltrexone (DEPADE) 50 MG tablet Take 1 tablet (50 mg total) by mouth daily. 90 tablet 0   citalopram (CELEXA) 20 MG tablet TAKE 1 TABLET BY MOUTH DAILY. 30 tablet 0   divalproex (DEPAKOTE) 500 MG DR tablet TAKE 1 TABLET BY MOUTH 2 TIMES DAILY. 60 tablet 3   hydrOXYzine (ATARAX/VISTARIL) 25 MG tablet Take 0.5-1 tablets (12.5-25 mg total) by mouth 2 (two) times daily as needed for anxiety (sedation precautions). 30 tablet 0   traMADol (ULTRAM) 50 MG tablet TAKE 1 TABLET BY MOUTH 3 TIMES DAILY AS NEEDED. 30 tablet 1   naproxen (NAPROSYN) 500 MG tablet TAKE 1 TABLET BY MOUTH DAILY AS NEEDED. 20 tablet 0   No facility-administered medications prior to visit.     Per HPI unless specifically indicated in ROS section below Review of Systems  Constitutional:  Negative  for activity change, appetite change, chills, fatigue, fever and unexpected weight change.  HENT:  Negative for hearing loss.   Eyes:  Negative for visual disturbance.  Respiratory:  Negative for cough, chest tightness, shortness of breath and wheezing.   Cardiovascular:  Negative for chest pain, palpitations and leg swelling.  Gastrointestinal:  Negative for abdominal distention, abdominal pain, blood in stool, constipation, diarrhea, nausea and vomiting.  Genitourinary:  Negative for difficulty urinating and hematuria.  Musculoskeletal:   Negative for arthralgias, myalgias and neck pain.  Skin:  Negative for rash.  Neurological:  Negative for dizziness, seizures, syncope and headaches.  Hematological:  Negative for adenopathy. Does not bruise/bleed easily.  Psychiatric/Behavioral:  Negative for dysphoric mood. The patient is not nervous/anxious.    Objective:  BP 140/78 (BP Location: Left Arm, Patient Position: Sitting, Cuff Size: Normal)   Pulse 85   Temp 98.1 F (36.7 C) (Temporal)   Ht 5' 9.75" (1.772 m)   Wt 239 lb (108.4 kg)   SpO2 95%   BMI 34.54 kg/m   Wt Readings from Last 3 Encounters:  06/11/21 239 lb (108.4 kg)  01/12/21 250 lb (113.4 kg)  08/03/20 250 lb (113.4 kg)      Physical Exam Vitals and nursing note reviewed.  Constitutional:      General: He is not in acute distress.    Appearance: Normal appearance. He is well-developed. He is not ill-appearing.  HENT:     Head: Normocephalic and atraumatic.     Right Ear: Hearing, tympanic membrane, ear canal and external ear normal.     Left Ear: Hearing, tympanic membrane, ear canal and external ear normal.  Eyes:     General: No scleral icterus.    Extraocular Movements: Extraocular movements intact.     Conjunctiva/sclera: Conjunctivae normal.     Pupils: Pupils are equal, round, and reactive to light.  Neck:     Thyroid: No thyroid mass or thyromegaly.     Vascular: No carotid bruit.  Cardiovascular:     Rate and Rhythm: Normal rate and regular rhythm.     Pulses: Normal pulses.          Radial pulses are 2+ on the right side and 2+ on the left side.     Heart sounds: Normal heart sounds. No murmur heard. Pulmonary:     Effort: Pulmonary effort is normal. No respiratory distress.     Breath sounds: Normal breath sounds. No wheezing, rhonchi or rales.  Abdominal:     General: Bowel sounds are normal. There is no distension.     Palpations: Abdomen is soft. There is no mass.     Tenderness: There is no abdominal tenderness. There is no  guarding or rebound.     Hernia: No hernia is present.  Musculoskeletal:        General: Normal range of motion.     Cervical back: Normal range of motion and neck supple.     Right lower leg: No edema.     Left lower leg: No edema.  Lymphadenopathy:     Cervical: No cervical adenopathy.  Skin:    General: Skin is warm and dry.     Findings: No rash.  Neurological:     General: No focal deficit present.     Mental Status: He is alert and oriented to person, place, and time.  Psychiatric:        Mood and Affect: Mood normal.        Behavior:  Behavior normal.        Thought Content: Thought content normal.        Judgment: Judgment normal.      Results for orders placed or performed in visit on 05/14/21  Valproic acid level  Result Value Ref Range   Valproic Acid Lvl 42.1 (L) 50.0 - 100.0 mg/L  CBC with Differential/Platelet  Result Value Ref Range   WBC 4.3 4.0 - 10.5 K/uL   RBC 5.13 4.22 - 5.81 Mil/uL   Hemoglobin 16.0 13.0 - 17.0 g/dL   HCT 13.2 44.0 - 10.2 %   MCV 89.1 78.0 - 100.0 fl   MCHC 34.9 30.0 - 36.0 g/dL   RDW 72.5 36.6 - 44.0 %   Platelets 189.0 150.0 - 400.0 K/uL   Neutrophils Relative % 49.2 43.0 - 77.0 %   Lymphocytes Relative 35.3 12.0 - 46.0 %   Monocytes Relative 11.3 3.0 - 12.0 %   Eosinophils Relative 2.9 0.0 - 5.0 %   Basophils Relative 1.3 0.0 - 3.0 %   Neutro Abs 2.1 1.4 - 7.7 K/uL   Lymphs Abs 1.5 0.7 - 4.0 K/uL   Monocytes Absolute 0.5 0.1 - 1.0 K/uL   Eosinophils Absolute 0.1 0.0 - 0.7 K/uL   Basophils Absolute 0.1 0.0 - 0.1 K/uL  PSA  Result Value Ref Range   PSA 0.52 0.10 - 4.00 ng/mL  Comprehensive metabolic panel  Result Value Ref Range   Sodium 137 135 - 145 mEq/L   Potassium 4.6 3.5 - 5.1 mEq/L   Chloride 100 96 - 112 mEq/L   CO2 30 19 - 32 mEq/L   Glucose, Bld 94 70 - 99 mg/dL   BUN 14 6 - 23 mg/dL   Creatinine, Ser 3.47 0.40 - 1.50 mg/dL   Total Bilirubin 0.5 0.2 - 1.2 mg/dL   Alkaline Phosphatase 58 39 - 117 U/L   AST 16 0  - 37 U/L   ALT 19 0 - 53 U/L   Total Protein 6.8 6.0 - 8.3 g/dL   Albumin 4.5 3.5 - 5.2 g/dL   GFR 425.95 >63.87 mL/min   Calcium 9.4 8.4 - 10.5 mg/dL  Lipid panel  Result Value Ref Range   Cholesterol 217 (H) 0 - 200 mg/dL   Triglycerides 564.3 (H) 0.0 - 149.0 mg/dL   HDL 32.95 (L) >18.84 mg/dL   VLDL 16.6 (H) 0.0 - 06.3 mg/dL   Total CHOL/HDL Ratio 6    NonHDL 179.28   LDL cholesterol, direct  Result Value Ref Range   Direct LDL 150.0 mg/dL   Depression screen [facility] 2/9 06/11/2021 05/19/2020 05/14/2019 11/03/2017 09/18/2015  Decreased Interest 1 0 0 2 2  Down, Depressed, Hopeless 1 0 0 1 2  PHQ - 2 Score 2 0 0 3 4  Altered sleeping 0 1 - 3 1  Tired, decreased energy 2 3 - 3 2  Change in appetite 1 0 - 0 0  Feeling bad or failure about yourself  1 0 - 1 2  Trouble concentrating 2 3 - 3 2  Moving slowly or fidgety/restless 0 0 - 0 2  Suicidal thoughts 0 0 - 0 0  PHQ-9 Score 8 7 - 13 13  Difficult doing work/chores - - - - Very difficult    GAD 7 : Generalized Anxiety Score 06/11/2021 05/19/2020 09/18/2015  Nervous, Anxious, on Edge 1 0 3  Control/stop worrying 1 0 3  Worry too much - different things Trouble relaxing  2 1 3   Restless 1 0 3  Easily annoyed or irritable 1 0 3  Afraid - awful might happen 1 0 2  Total GAD 7 Score 8 2 20    Assessment & Plan:  This visit occurred during the SARS-CoV-2 public health emergency.  Safety protocols were in place, including screening questions prior to the visit, additional usage of staff PPE, and extensive cleaning of exam room while observing appropriate contact time as indicated for disinfecting solutions.   Problem List Items Addressed This Visit     Bipolar 2 disorder (HCC)    Chronic, stable period on celexa 20mg  daily with depakote 500mg  bid and hydroxyzine PRN.  Presumed bipolar 2. Never manic. Has declined psych eval. PHQ9/GAD7 reviewed.  MDQ negative.        Obesity, Class I, BMI 30.0-34.9 (see actual BMI)     Congratulated on 11 lb weight loss over the past year. Encouraged healthy diet and lifestyle choices to affect sustainable weight loss.        Health maintenance examination    Preventative protocols reviewed and updated unless pt declined. Discussed healthy diet and lifestyle.        History of alcohol abuse    Rare alcohol intake - tries to avoid.  Continues naltrexone PRN.  Discussed not taking naltrexone and tramadol together.        Hypertriglyceridemia    Chronic, stable off medication. Discussed diet choices to improve lipid levels. The 10-year ASCVD risk score DC ., et al., 2013) is: 5.5%   Values used to calculate the score:     Age: 19 years     Sex: Male     Is Non-Hispanic African American: No     Diabetic: No     Tobacco smoker: No     Systolic Blood Pressure: 140 mmHg     Is BP treated: No     HDL Cholesterol: 38.2 mg/dL     Total Cholesterol: 217 mg/dL        Family history of prostate cancer in father   Primary osteoarthritis of right knee    Manages with NSAID and tramadol PRN       Relevant Medications   traMADol (ULTRAM) 50 MG tablet   naproxen (NAPROSYN) 500 MG tablet   Daytime somnolence    Ongoing without significant snoring or witnessed apnea. Declines OSA eval.        Other Visit Diagnoses     Special screening for malignant neoplasms, colon    -  Primary   Relevant Orders   Ambulatory referral to Gastroenterology        Meds ordered this encounter  Medications   traMADol (ULTRAM) 50 MG tablet    Sig: Take 1 tablet (50 mg total) by mouth 2 (two) times daily as needed for moderate pain.    Dispense:  15 tablet    Refill:  1   citalopram (CELEXA) 20 MG tablet    Sig: Take 1 tablet (20 mg total) by mouth daily.    Dispense:  90 tablet    Refill:  3   divalproex (DEPAKOTE) 500 MG DR tablet    Sig: Take 1 tablet (500 mg total) by mouth 2 (two) times daily.    Dispense:  180 tablet    Refill:  3   hydrOXYzine  (ATARAX/VISTARIL) 25 MG tablet    Sig: Take 0.5-1 tablets (12.5-25 mg total) by mouth 2 (two) times daily as needed for anxiety (sedation precautions).  Dispense:  30 tablet    Refill:  3   naproxen (NAPROSYN) 500 MG tablet    Sig: Take 1 tablet (500 mg total) by mouth 2 (two) times daily as needed for moderate pain (knee pain).    Dispense:  30 tablet    Refill:  3    Orders Placed This Encounter  Procedures   Ambulatory referral to Gastroenterology    Referral Priority:   Routine    Referral Type:   Consultation    Referral Reason:   Specialty Services Required    Number of Visits Requested:   1     Patient instructions: We will refer you for colonoscopy.  Check on latest tetanus shot at work and let us know dates.  Work on more fatty fish in diet, more fiber and legumes, less fried greasy foods, less sugar in the diet to help cholesterol levels.  You are doing well today  Return as needed or in 1 year for next physical .  Follow up plan: Return in about 1 year (around 06/11/2022) for annual exam, prior fasting for blood work.  Eustaquio Boyden, MD

## 2021-06-11 NOTE — Assessment & Plan Note (Signed)
Congratulated on 11 lb weight loss over the past year. Encouraged healthy diet and lifestyle choices to affect sustainable weight loss.

## 2021-06-11 NOTE — Assessment & Plan Note (Signed)
Manages with NSAID and tramadol PRN

## 2021-07-12 ENCOUNTER — Telehealth: Payer: 59 | Admitting: Physician Assistant

## 2021-07-12 ENCOUNTER — Telehealth: Payer: Self-pay

## 2021-07-12 ENCOUNTER — Telehealth (INDEPENDENT_AMBULATORY_CARE_PROVIDER_SITE_OTHER): Payer: 59 | Admitting: Family Medicine

## 2021-07-12 ENCOUNTER — Encounter: Payer: Self-pay | Admitting: Family Medicine

## 2021-07-12 VITALS — Ht 69.75 in | Wt 235.0 lb

## 2021-07-12 DIAGNOSIS — J0110 Acute frontal sinusitis, unspecified: Secondary | ICD-10-CM | POA: Diagnosis not present

## 2021-07-12 MED ORDER — PREDNISONE 20 MG PO TABS: ORAL_TABLET | ORAL | 0 refills | Status: DC

## 2021-07-12 MED ORDER — AMOXICILLIN-POT CLAVULANATE 875-125 MG PO TABS: 1.0000 | ORAL_TABLET | Freq: Two times a day (BID) | ORAL | 0 refills | Status: AC

## 2021-07-12 NOTE — Telephone Encounter (Signed)
Patient is aware, appointment has been scheduled virtually today with Dr. Reece Agar for 1230, Bridgette Habermann, CMA has contacted patient.

## 2021-07-12 NOTE — Telephone Encounter (Signed)
Would offer appt today at 12:30pm. With negative COVID test, could be OV if preferred otherwise VV.

## 2021-07-12 NOTE — Progress Notes (Signed)
Patient was notified x 3 and provider waited over 20 minutes without any attempts of patient to log on. Marked No Show

## 2021-07-12 NOTE — Telephone Encounter (Signed)
Pt called stating he has taken 2 Covid tests: 4 days ago and today. Both tests are negative. He is having headaches, sinus pressure and pain. Asked for an antibiotic. I advised him he would need to be seen. We di dnot have availability today but that he could go to an UC or [email or web address] for a virtual visit. He will most likely do VV on [email or web address]  To Dr Sharen Hones as Lorain Childes.

## 2021-07-12 NOTE — Assessment & Plan Note (Addendum)
Anticipate viral or allergic since symptoms started after mowing the lawn.  Will continue flonase, nasal saline, stop BC powder and start short prednisone taper. If no better with prednisone taper, sent in "wait and see prescription" for augmentin course to cover possible developing bacterial infection. Pt agrees with plan, will update if not improving with treatment.

## 2021-07-12 NOTE — Progress Notes (Signed)
Patient ID: Danny [name], male    DOB: 1971/09/25, 50 y.o.   MRN: [id number]  Virtual visit completed through MyChart, a video enabled telemedicine application. Due to national recommendations of social distancing due to COVID-19, a virtual visit is felt to be most appropriate for this patient at this time. Reviewed limitations, risks, security and privacy concerns of performing a virtual visit and the availability of in person appointments. I also reviewed that there may be a patient responsible charge related to this service. The patient agreed to proceed.   Patient location: parked in truck Provider location: Adult nurse at [facility], office Persons participating in this virtual visit: patient, provider   If any vitals were documented, they were collected by patient at home unless specified below.    Ht 5' 9.75" (1.772 m)   Wt 235 lb (106.6 kg)   BMI 33.96 kg/m   BP Readings from Last 3 Encounters:  06/11/21 140/78  08/03/20 140/76  05/19/20 124/68    CC: headache, sinus congestion Subjective:   HPI: DAELIN HASTE is a 50 y.o. male presenting on 07/12/2021 for Headache and Sinusitis   4-5d h/o sinus congestion, sinus pressure headache, upper teeth hurt, ST with PNdrainage.  Treating with tylenol sinus, flonase, nasal saline spray, BC powders. Has afrin but not using recently.  No sick contacts at home.  Symptoms may have started after mowing the grass - he did wear mask.   No fevers/chills, ear pain, cough or dyspnea, abd pain, nausea, diarrhea.   Tested negative for covid x2, latest test was done today. Last acute sinusitis 07/2020 treated with augmenting with benefit.      Relevant past medical, surgical, family and social history reviewed and updated as indicated. Interim medical history since our last visit reviewed. Allergies and medications reviewed and updated. Outpatient Medications Prior to Visit  Medication Sig Dispense Refill   citalopram (CELEXA) 20 MG  tablet Take 1 tablet (20 mg total) by mouth daily. 90 tablet 3   diclofenac Sodium (VOLTAREN) 1 % GEL APPLY 4 GRAMS TO AFFECTED AREA 4 TIMES A DAY. 400 g 3   divalproex (DEPAKOTE) 500 MG DR tablet Take 1 tablet (500 mg total) by mouth 2 (two) times daily. 180 tablet 3   fluticasone (FLONASE) 50 MCG/ACT nasal spray Place 2 sprays into both nostrils daily. 16 g 6   hydrOXYzine (ATARAX/VISTARIL) 25 MG tablet Take 0.5-1 tablets (12.5-25 mg total) by mouth 2 (two) times daily as needed for anxiety (sedation precautions). 30 tablet 3   naltrexone (DEPADE) 50 MG tablet Take 1 tablet (50 mg total) by mouth daily. 90 tablet 0   naproxen (NAPROSYN) 500 MG tablet Take 1 tablet (500 mg total) by mouth 2 (two) times daily as needed for moderate pain (knee pain). 30 tablet 3   traMADol (ULTRAM) 50 MG tablet Take 1 tablet (50 mg total) by mouth 2 (two) times daily as needed for moderate pain. 15 tablet 1   No facility-administered medications prior to visit.     Per HPI unless specifically indicated in ROS section below Review of Systems Objective:  Ht 5' 9.75" (1.772 m)   Wt 235 lb (106.6 kg)   BMI 33.96 kg/m   Wt Readings from Last 3 Encounters:  07/12/21 235 lb (106.6 kg)  06/11/21 239 lb (108.4 kg)  01/12/21 250 lb (113.4 kg)       Physical exam: Gen: alert, NAD, not ill appearing Pulm: speaks in complete sentences without increased work  of breathing Psych: normal mood, normal thought content      Results for orders placed or performed in visit on 05/14/21  Valproic acid level  Result Value Ref Range   Valproic Acid Lvl 42.1 (L) 50.0 - 100.0 mg/L  CBC with Differential/Platelet  Result Value Ref Range   WBC 4.3 4.0 - 10.5 K/uL   RBC 5.13 4.22 - 5.81 Mil/uL   Hemoglobin 16.0 13.0 - 17.0 g/dL   HCT 48.1 85.6 - 31.4 %   MCV 89.1 78.0 - 100.0 fl   MCHC 34.9 30.0 - 36.0 g/dL   RDW 97.0 26.3 - 78.5 %   Platelets 189.0 150.0 - 400.0 K/uL   Neutrophils Relative % 49.2 43.0 - 77.0 %    Lymphocytes Relative 35.3 12.0 - 46.0 %   Monocytes Relative 11.3 3.0 - 12.0 %   Eosinophils Relative 2.9 0.0 - 5.0 %   Basophils Relative 1.3 0.0 - 3.0 %   Neutro Abs 2.1 1.4 - 7.7 K/uL   Lymphs Abs 1.5 0.7 - 4.0 K/uL   Monocytes Absolute 0.5 0.1 - 1.0 K/uL   Eosinophils Absolute 0.1 0.0 - 0.7 K/uL   Basophils Absolute 0.1 0.0 - 0.1 K/uL  PSA  Result Value Ref Range   PSA 0.52 0.10 - 4.00 ng/mL  Comprehensive metabolic panel  Result Value Ref Range   Sodium 137 135 - 145 mEq/L   Potassium 4.6 3.5 - 5.1 mEq/L   Chloride 100 96 - 112 mEq/L   CO2 30 19 - 32 mEq/L   Glucose, Bld 94 70 - 99 mg/dL   BUN 14 6 - 23 mg/dL   Creatinine, Ser 8.85 0.40 - 1.50 mg/dL   Total Bilirubin 0.5 0.2 - 1.2 mg/dL   Alkaline Phosphatase 58 39 - 117 U/L   AST 16 0 - 37 U/L   ALT 19 0 - 53 U/L   Total Protein 6.8 6.0 - 8.3 g/dL   Albumin 4.5 3.5 - 5.2 g/dL   GFR 027.74 >12.87 mL/min   Calcium 9.4 8.4 - 10.5 mg/dL  Lipid panel  Result Value Ref Range   Cholesterol 217 (H) 0 - 200 mg/dL   Triglycerides 867.6 (H) 0.0 - 149.0 mg/dL   HDL 72.09 (L) >47.09 mg/dL   VLDL 62.8 (H) 0.0 - 36.6 mg/dL   Total CHOL/HDL Ratio 6    NonHDL 179.28   LDL cholesterol, direct  Result Value Ref Range   Direct LDL 150.0 mg/dL   Assessment & Plan:   Problem List Items Addressed This Visit     Acute sinusitis - Primary    Anticipate viral or allergic since symptoms started after mowing the lawn.  Will continue flonase, nasal saline, stop BC powder and start short prednisone taper. If no better with prednisone taper, sent in "wait and see prescription" for augmentin course to cover possible developing bacterial infection. Pt agrees with plan, will update if not improving with treatment.       Relevant Medications   predniSONE (DELTASONE) 20 MG tablet   amoxicillin-clavulanate (AUGMENTIN) 875-125 MG tablet     Meds ordered this encounter  Medications   predniSONE (DELTASONE) 20 MG tablet    Sig: Take two  tablets daily for 3 days followed by one tablet daily for 3 days    Dispense:  10 tablet    Refill:  0   amoxicillin-clavulanate (AUGMENTIN) 875-125 MG tablet    Sig: Take 1 tablet by mouth 2 (two) times daily for 7 days.  Dispense:  14 tablet    Refill:  0   No orders of the defined types were placed in this encounter.   I discussed the assessment and treatment plan with the patient. The patient was provided an opportunity to ask questions and all were answered. The patient agreed with the plan and demonstrated an understanding of the instructions. The patient was advised to call back or seek an in-person evaluation if the symptoms worsen or if the condition fails to improve as anticipated.  Follow up plan: No follow-ups on file.  Ria Bush, MD

## 2021-09-01 ENCOUNTER — Ambulatory Visit: Payer: 59 | Admitting: Orthopaedic Surgery

## 2021-09-07 ENCOUNTER — Ambulatory Visit: Payer: Self-pay

## 2021-09-07 ENCOUNTER — Ambulatory Visit: Payer: 59 | Admitting: Orthopaedic Surgery

## 2021-09-07 ENCOUNTER — Encounter: Payer: Self-pay | Admitting: Orthopaedic Surgery

## 2021-09-07 ENCOUNTER — Other Ambulatory Visit: Payer: Self-pay

## 2021-09-07 DIAGNOSIS — M1711 Unilateral primary osteoarthritis, right knee: Secondary | ICD-10-CM | POA: Diagnosis not present

## 2021-09-07 DIAGNOSIS — M25561 Pain in right knee: Secondary | ICD-10-CM

## 2021-09-07 DIAGNOSIS — G8929 Other chronic pain: Secondary | ICD-10-CM | POA: Diagnosis not present

## 2021-09-07 MED ORDER — METHYLPREDNISOLONE ACETATE 40 MG/ML IJ SUSP
80.0000 mg | INTRAMUSCULAR | Status: AC | PRN
  Administered 2021-09-07: 80 mg via INTRA_ARTICULAR

## 2021-09-07 MED ORDER — LIDOCAINE HCL 1 % IJ SOLN
2.0000 mL | INTRAMUSCULAR | Status: AC | PRN
  Administered 2021-09-07: 2 mL

## 2021-09-07 MED ORDER — BUPIVACAINE HCL 0.25 % IJ SOLN
2.0000 mL | INTRAMUSCULAR | Status: AC | PRN
  Administered 2021-09-07: 2 mL via INTRA_ARTICULAR

## 2021-09-07 NOTE — Progress Notes (Signed)
Office Visit Note   Patient: Danny [name]           Date of Birth: [birth year]           MRN: [id number] Visit Date: 09/07/2021              Requested by: Eustaquio Boyden, MD [address] [place],  [place] [address] PCP: Eustaquio Boyden, MD   Assessment & Plan: Visit Diagnoses:  1. Chronic pain of right knee   2. Primary osteoarthritis of right knee     Plan: Recurrent pain right knee to the point of compromise.  Danny [name] has had prior ACL reconstruction over 14 years ago and has a documented history of osteoarthritis per prior and MRI scan.  Changes involved all 3 compartments.  He does have an occasional effusion.  No sensation of his knee giving way.  Films today reveal some progression of the arthritic changes but he still is maintaining the joint spaces and normal alignment.  Long discussion regarding treatment options including knee replacement.  He is only 50 and I think he should continue with nonoperative treatment.  We talked about exercises and over-the-counter medicines.  He does take tramadol on occasion per his primary care physician.  I will inject his knee with cortisone today.  He has had cortisone and viscosupplementation in the past with temporary relief.  He is fully aware that this is a temporizing procedure  Follow-Up Instructions: No follow-ups on file.   Orders:  Orders Placed This Encounter  Procedures   XR KNEE 3 VIEW RIGHT   No orders of the defined types were placed in this encounter.     Procedures: Large Joint Inj: R knee on 09/07/2021 1:56 PM Indications: pain and diagnostic evaluation Details: 25 G 1.5 in needle, anteromedial approach  Arthrogram: No  Medications: 2 mL lidocaine 1 %; 80 mg methylPREDNISolone acetate 40 MG/ML; 2 mL bupivacaine 0.25 % Procedure, treatment alternatives, risks and benefits explained, specific risks discussed. Consent was given by the patient. Immediately prior to procedure a time out was called to  verify the correct patient, procedure, equipment, support staff and site/side marked as required. Patient was prepped and draped in the usual sterile fashion.      Clinical Data: No additional findings.   Subjective: Chief Complaint  Patient presents with   Right Knee - Pain  Patient presents today for recurrent chronic right knee pain. He said that his he continues to hurt and states that it has worsened with time. He finished a round of Euflexxa in August of last year. He takes Tramadol and Tylenol, but states that it does not help much. He experiences a "toothache" feeling at night. He feels like it pops with movement.  Had an MRI scan of his right knee earlier in the year revealing tricompartmental degenerative changes.  He had a prior ACL reconstruction about 14 years ago and that appeared to be intact.  No sensation of his knee giving way  HPI  Review of Systems   Objective: Vital Signs: There were no vitals taken for this visit.  Physical Exam Constitutional:      Appearance: He is well-developed.  Pulmonary:     Effort: Pulmonary effort is normal.  Skin:    General: Skin is warm and dry.  Neurological:     Mental Status: He is alert and oriented to person, place, and time.  Psychiatric:        Behavior: Behavior normal.  Ortho Exam awake alert and oriented x3.  Comfortable sitting right knee was not hot red warm or swollen.  No opening with varus valgus stress and a negative anterior drawer sign.  Well-healed incisions from his prior ACL reconstruction.  Positive crepitation with flexion extension about the patella and some mild pain with compression.  No effusion.  No popliteal pain or mass.  No calf pain.  Mild medial lateral joint pain  Specialty Comments:  No specialty comments available.  Imaging: No results found.   PMFS History: Patient Active Problem List   Diagnosis Date Noted   Daytime somnolence 05/19/2020   Acute sinusitis 06/27/2019   Primary  osteoarthritis of right knee 05/24/2019   Family history of prostate cancer in father 02/03/2019   Neuropathy 02/06/2017   Hypertriglyceridemia 10/08/2016   History of alcohol abuse 07/15/2016   Obesity, Class I, BMI 30.0-34.9 (see actual BMI) 10/12/2015   Health maintenance examination 10/12/2015   OCD (obsessive compulsive disorder) 08/06/2015   Chronic congestion of paranasal sinus 08/06/2015   Bipolar 2 disorder (HCC)    Past Medical History:  Diagnosis Date   Anxiety    Anxiety and depression    states controlled with meds   COVID-19 virus infection 09/2019   tested positive   Headache(784.0)    sinus headaches   Impaired glucose tolerance    denies diabetes- states glucose elevated x 1 in past   Multiple allergies    seansonal,environmental   Perianal fistula, posterior midline 02/06/2012   Pilonidal cyst     Family History  Problem Relation Age of Onset   Cancer Father 54       prostate   Anxiety disorder Father    Depression Mother    Heart disease Maternal Grandfather    Cancer Paternal Grandfather        lung   Colon cancer Neg Hx     Past Surgical History:  Procedure Laterality Date   ANAL FISTULECTOMY  03/08/12   EUA   ANTERIOR CRUCIATE LIGAMENT REPAIR  2008   right   SHOULDER ARTHROSCOPY WITH ROTATOR CUFF REPAIR AND SUBACROMIAL DECOMPRESSION Right 12/10/2013   Procedure: SHOULDER ARTHROSCOPY WITH SUBACROMIAL DECOMPRESSION, DISTAL CALVICLE RESECTION, OPEN ROTATOR CUFF REPAIR.;  Surgeon: Valeria Batman, MD;  Location: [facility];  Service: Orthopedics;  Laterality: Right;   VASECTOMY  2000   WISDOM TOOTH EXTRACTION     right  lower   Social History   Occupational History   Not on file  Tobacco Use   Smoking status: Former   Smokeless tobacco: Current    Types: Chew  Substance and Sexual Activity   Alcohol use: No    Alcohol/week: 0.0 standard drinks    Comment: 12 pk week--Quit Dec 2017   Drug use: No   Sexual activity: Not on  file

## 2021-09-27 ENCOUNTER — Other Ambulatory Visit: Payer: Self-pay | Admitting: Family Medicine

## 2021-09-27 NOTE — Telephone Encounter (Signed)
Refill request Tramadol Last refill 06/11/21 #15/1 Last office visit video 07/12/21 Upcoming appointment 06/14/22

## 2021-09-29 NOTE — Telephone Encounter (Signed)
ERx 

## 2021-10-27 ENCOUNTER — Encounter: Payer: Self-pay | Admitting: Gastroenterology

## 2021-11-04 IMAGING — MR MR KNEE*R* W/O CM
4 of 7 series · 22 of 40 positions shown · non-contrast
Comparison: X-ray 01/31/2019, MRI 04/30/2015

CLINICAL DATA: Chronic right knee pain

EXAM:
MRI OF THE RIGHT KNEE WITHOUT CONTRAST
TECHNIQUE: Multiplanar, multisequence MR imaging of the knee was performed. No
intravenous contrast was administered.

[Series 4: T1 · coronal · 4.0mm · 0.29mm/px · 3 of 29 slices shown]
[im 6/29]
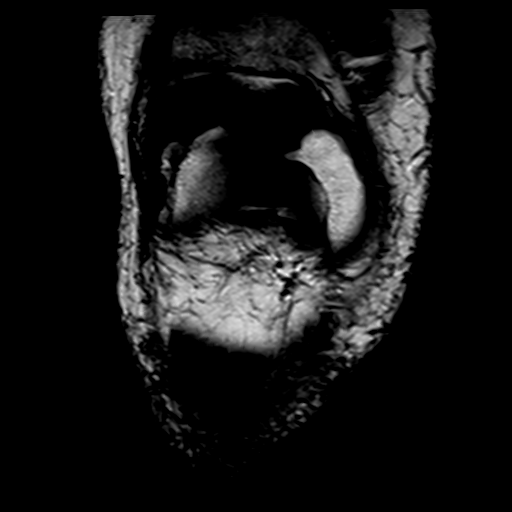
[im 17/29]
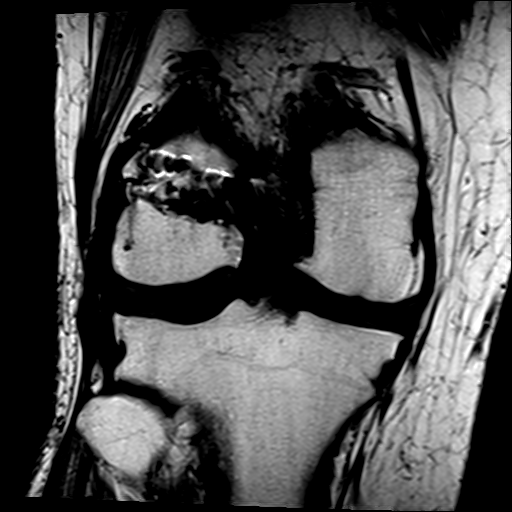
[im 29/29]
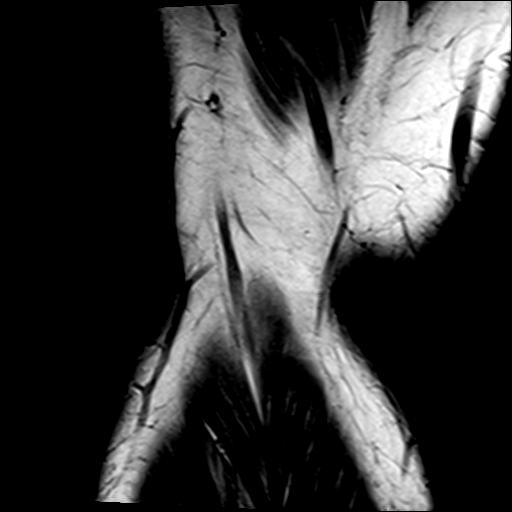

[Series 5: T2 fat-sat · coronal · 4.0mm · 0.59mm/px · 5 of 25 slices shown (1 of 2)]
[im 1/25]
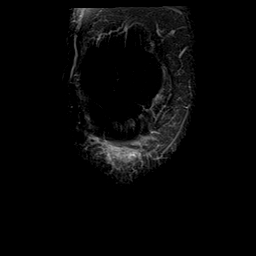
[im 7/25]
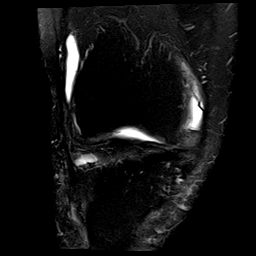
[im 13/25]
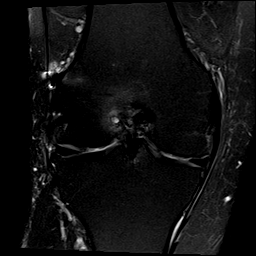
[im 19/25]
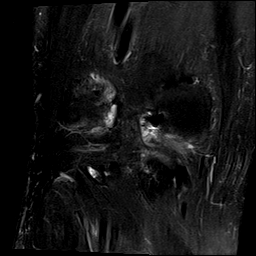
[im 25/25]
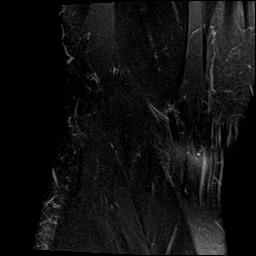

[Series 7: PD fat-sat · sagittal · 3.0mm · 0.31mm/px · 7 of 30 slices shown]
[im 1/30]
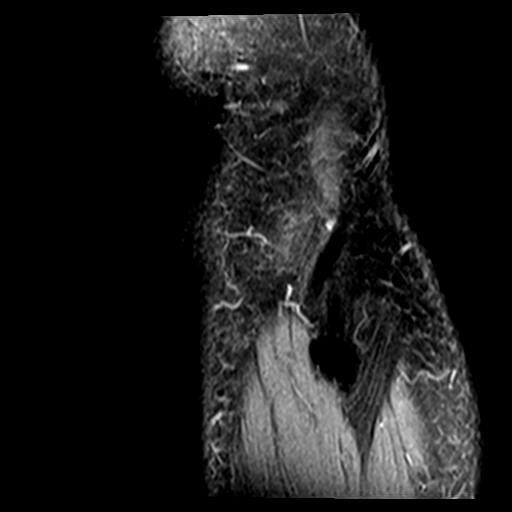
[im 5/30]
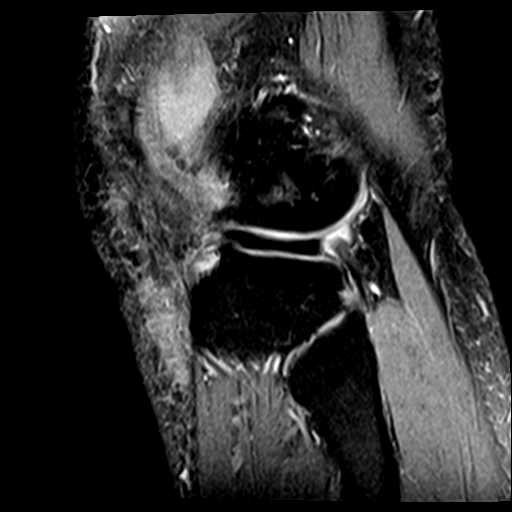
[im 10/30]
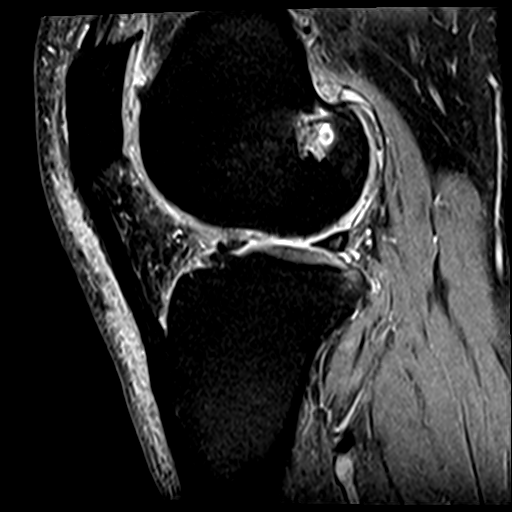
[im 15/30]
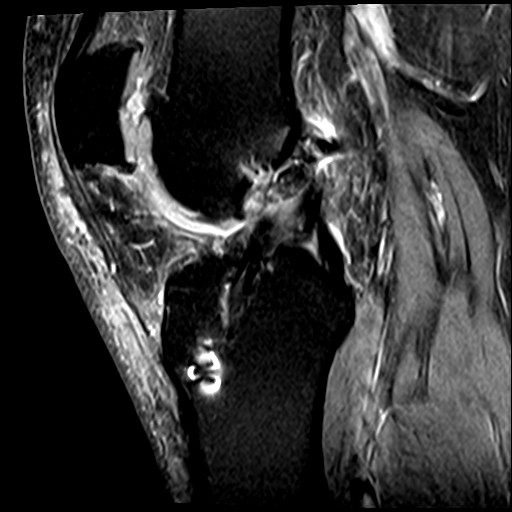
[im 20/30]
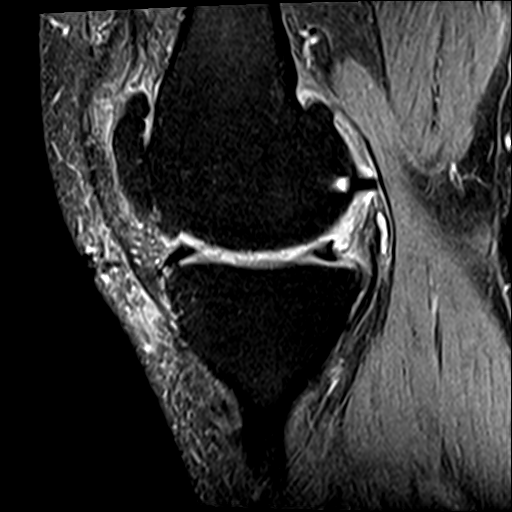
[im 25/30]
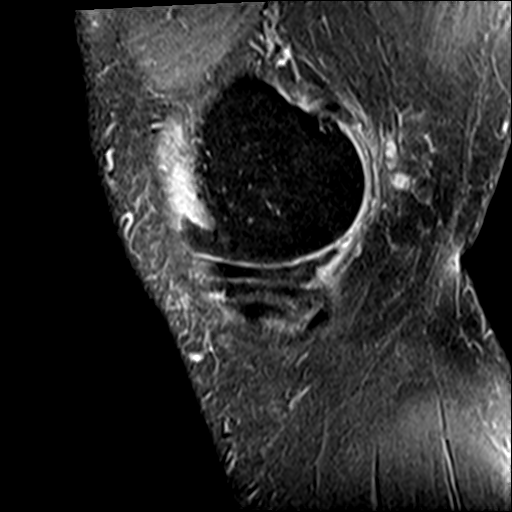
[im 30/30]
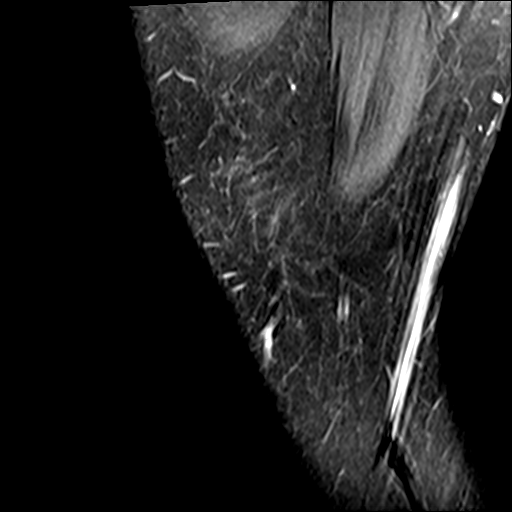

[Series 8: T2 fat-sat · sagittal · 3.0mm · 0.29mm/px · 7 of 30 slices shown (2 of 2)]
[im 1/30]
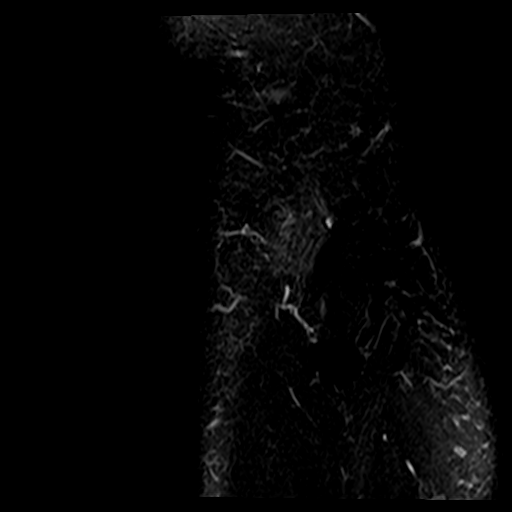
[im 5/30]
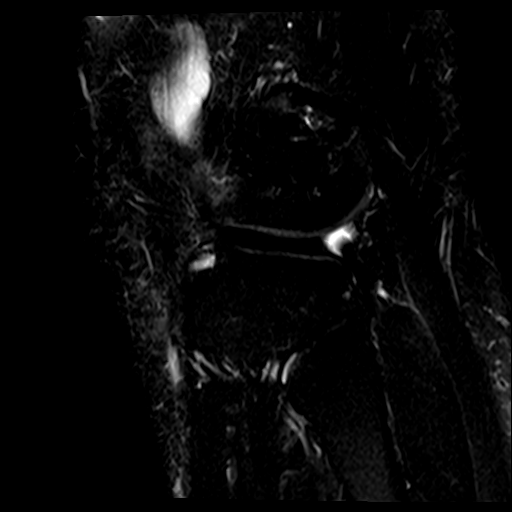
[im 10/30]
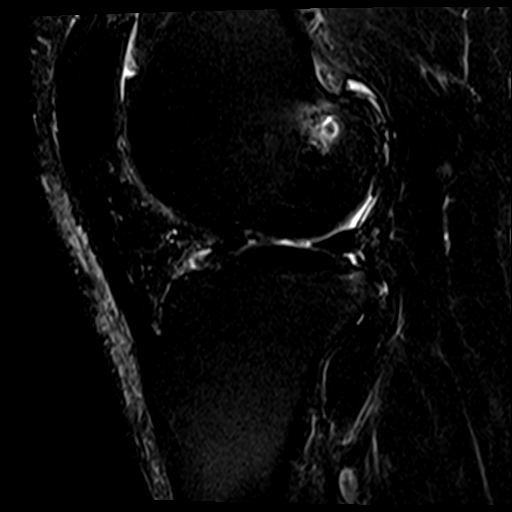
[im 15/30]
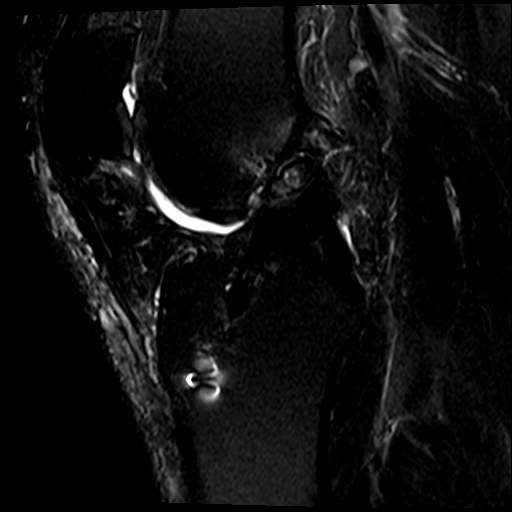
[im 20/30]
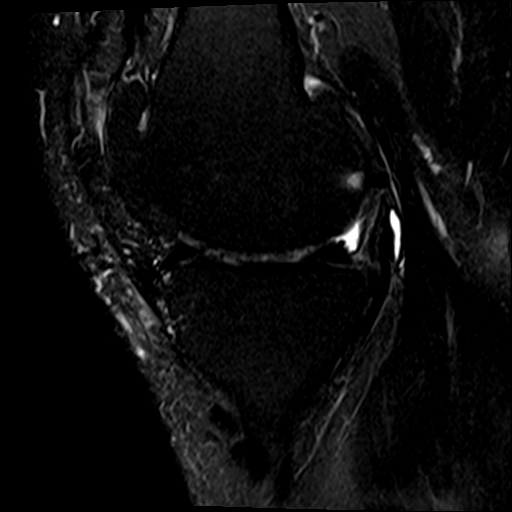
[im 25/30]
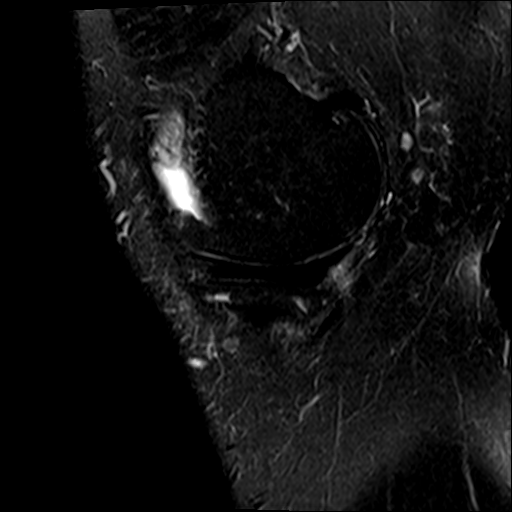
[im 30/30]
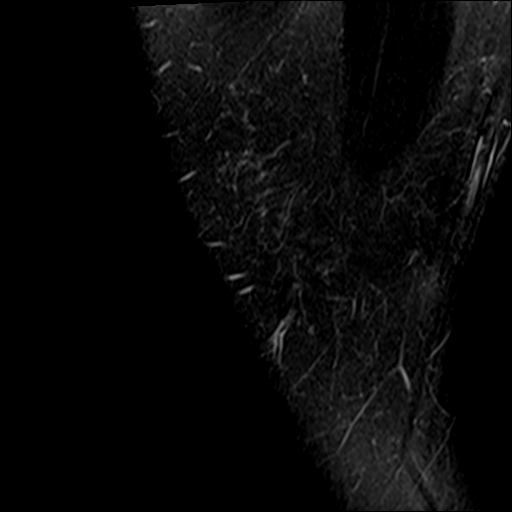

[22 of 40 positions shown; findings below may reference images not displayed]

FINDINGS: MENISCI

Medial meniscus: Intrasubstance degeneration with degenerative
fraying of the free edge and superior surface of the posterior horn
(series 7, image 9). No discrete tear.

Lateral meniscus: Mild intrasubstance degeneration without discrete
tear.

LIGAMENTS

Cruciates: Prior ACL reconstruction. Graft is intact but appears
degenerated (series 9, image 6). Intact PCL.

Collaterals: Medial collateral ligament is intact. Lateral
collateral ligament complex is intact.

CARTILAGE

Patellofemoral: Progressive chondral thinning and surface
irregularity of the patellar and trochlear articular surfaces with
scattered areas of partial thickness fissuring (series 8, images
13-16).

Medial: Moderate chondral thinning and surface irregularity of the
weight-bearing medial compartment with scattered partial-thickness
areas of chondral fissuring/ulceration. No discrete full-thickness
defect.

Lateral: Partial thickness cartilage defect of the central
weight-bearing lateral femoral condyle measuring approximately 7 x 5
mm (series 8, image 22), progressed from prior.

Joint: Small knee joint effusion. Mildly edematous appearance of
Hoffa's fat. 9 mm loose body at the anterior aspect of the
intercondylar notch.

Popliteal Fossa:  Small Baker's cyst.  Mild popliteus tendinosis.

Extensor Mechanism:  Intact quadriceps tendon and patellar tendon.

Bones: Tricompartmental joint space narrowing and marginal
osteophyte formation. Prominent osteophytes extend into and narrows
the intercondylar notch (series 4, image 15). Intraosseous ganglion
formation within the lateral femoral condyle adjacent to the
intercondylar notch. No definite ganglion formation within the
tibial or femoral tunnel tracks. No fracture or dislocation.

Other: None.
IMPRESSION: 1. Moderate tricompartmental osteoarthritis, progressed from
previous study.
2. Intrasubstance degeneration of the medial and lateral menisci
without discrete tear.
3. Prior ACL reconstruction. Graft is intact but appears
degenerated.
4. Small knee joint effusion with 9 mm loose body at the anterior
aspect of the intercondylar notch. Small Baker's cyst.

## 2021-12-22 HISTORY — PX: COLONOSCOPY: SHX174

## 2021-12-29 ENCOUNTER — Ambulatory Visit (AMBULATORY_SURGERY_CENTER): Payer: 59

## 2021-12-29 ENCOUNTER — Other Ambulatory Visit: Payer: Self-pay

## 2021-12-29 VITALS — Ht 70.0 in | Wt 235.0 lb

## 2021-12-29 DIAGNOSIS — Z1211 Encounter for screening for malignant neoplasm of colon: Secondary | ICD-10-CM

## 2021-12-29 MED ORDER — NA SULFATE-K SULFATE-MG SULF 17.5-3.13-1.6 GM/177ML PO SOLN: 1.0000 | Freq: Once | ORAL | 0 refills | Status: AC

## 2021-12-29 NOTE — Progress Notes (Signed)
No egg or soy allergy known to patient  ?No issues known to pt with past sedation with any surgeries or procedures ?Patient denies ever being told they had issues or difficulty with intubation  ?No FH of Malignant Hyperthermia ?Pt is not on diet pills ?Pt is not on home 02  ?Pt is not on blood thinners  ?Pt denies issues with constipation  ?No A fib or A flutter ?NO PA's for preps discussed with pt in PV today  ?Discussed with pt there will be an out-of-pocket cost for prep and that varies from $0 to 70 + dollars - pt verbalized understanding  ?Due to the COVID-19 pandemic we are asking patients to follow certain guidelines in PV and the [facility]   ?Pt aware of COVID protocols and [facility] guidelines  ?PV completed over the phone. Pt verified name, DOB, address and insurance during PV today.  ?Pt mailed instruction packet with copy of consent form to read and not return, and instructions.  ?Pt encouraged to call with questions or issues.  ?If pt has My chart, procedure instructions sent via My Chart  ? ?

## 2022-01-10 ENCOUNTER — Encounter: Payer: Self-pay | Admitting: Gastroenterology

## 2022-01-12 ENCOUNTER — Encounter: Payer: Self-pay | Admitting: Gastroenterology

## 2022-01-12 ENCOUNTER — Other Ambulatory Visit: Payer: Self-pay

## 2022-01-12 ENCOUNTER — Ambulatory Visit (AMBULATORY_SURGERY_CENTER): Payer: 59 | Admitting: Gastroenterology

## 2022-01-12 ENCOUNTER — Encounter: Payer: 59 | Admitting: Gastroenterology

## 2022-01-12 VITALS — BP 137/87 | HR 65 | Temp 98.6°F | Resp 13 | Ht 70.0 in | Wt 235.0 lb

## 2022-01-12 DIAGNOSIS — Z1211 Encounter for screening for malignant neoplasm of colon: Secondary | ICD-10-CM | POA: Diagnosis present

## 2022-01-12 MED ORDER — SODIUM CHLORIDE 0.9 % IV SOLN: 500.0000 mL | Freq: Once | INTRAVENOUS | Status: DC

## 2022-01-12 NOTE — Op Note (Signed)
[place] Patient Name: Danny [name] Procedure Date: 01/12/2022 9:14 AM MRN: [id number] Endoscopist: Remo Lipps P. Havery Moros , MD Age: 51 Referring MD:  Date of Birth: October 15, 1971 Gender: Male Account #: [id number] Procedure:                Colonoscopy Indications:              Screening for colorectal malignant neoplasm, This                            is the patient's first colonoscopy Medicines:                Monitored Anesthesia Care Procedure:                Pre-Anesthesia Assessment:                           - Prior to the procedure, a History and Physical                            was performed, and patient medications and                            allergies were reviewed. The patient's tolerance of                            previous anesthesia was also reviewed. The risks                            and benefits of the procedure and the sedation                            options and risks were discussed with the patient.                            All questions were answered, and informed consent                            was obtained. Prior Anticoagulants: The patient has                            taken no previous anticoagulant or antiplatelet                            agents. ASA Grade Assessment: II - A patient with                            mild systemic disease. After reviewing the risks                            and benefits, the patient was deemed in                            satisfactory condition to undergo the procedure.  After obtaining informed consent, the colonoscope                            was passed under direct vision. Throughout the                            procedure, the patient's blood pressure, pulse, and                            oxygen saturations were monitored continuously. The                            CF [id number] was introduced through the anus                            and advanced to the  the cecum, identified by                            appendiceal orifice and ileocecal valve. The                            colonoscopy was performed without difficulty. The                            patient tolerated the procedure well. The quality                            of the bowel preparation was good. The ileocecal                            valve, appendiceal orifice, and rectum were                            photographed. Scope In: 9:19:35 AM Scope Out: 9:35:42 AM Scope Withdrawal Time: 0 hours 13 minutes 36 seconds  Total Procedure Duration: 0 hours 16 minutes 7 seconds  Findings:                 The perianal and digital rectal examinations were                            normal.                           Multiple medium-mouthed diverticula were found in                            the entire colon.                           Internal hemorrhoids were found during retroflexion.                           The exam was otherwise without abnormality. Complications:            No immediate complications. Estimated blood loss:  None. Estimated Blood Loss:     Estimated blood loss: none. Impression:               - Diverticulosis in the entire examined colon.                           - Internal hemorrhoids.                           - The examination was otherwise normal.                           - No polyps Recommendation:           - Patient has a contact number available for                            emergencies. The signs and symptoms of potential                            delayed complications were discussed with the                            patient. Return to normal activities tomorrow.                            Written discharge instructions were provided to the                            patient.                           - Resume previous diet.                           - Continue present medications.                           - Repeat  colonoscopy in 10 years for screening                            purposes. Remo Lipps P. Havery Moros, MD 01/12/2022 9:39:13 AM This report has been signed electronically.

## 2022-01-12 NOTE — Patient Instructions (Signed)
Resume previous medications.   Handouts on findings given to patient ( diverticulosis and hemorrhoids)  YOU HAD AN ENDOSCOPIC PROCEDURE TODAY AT THE [facility]:   Refer to the procedure report that was given to you for any specific questions about what was found during the examination.  If the procedure report does not answer your questions, please call your gastroenterologist to clarify.  If you requested that your care partner not be given the details of your procedure findings, then the procedure report has been included in a sealed envelope for you to review at your convenience later.  YOU SHOULD EXPECT: Some feelings of bloating in the abdomen. Passage of more gas than usual.  Walking can help get rid of the air that was put into your GI tract during the procedure and reduce the bloating. If you had a lower endoscopy (such as a colonoscopy or flexible sigmoidoscopy) you may notice spotting of blood in your stool or on the toilet paper. If you underwent a bowel prep for your procedure, you may not have a normal bowel movement for a few days.  Please Note:  You might notice some irritation and congestion in your nose or some drainage.  This is from the oxygen used during your procedure.  There is no need for concern and it should clear up in a day or so.  SYMPTOMS TO REPORT IMMEDIATELY:  Following lower endoscopy (colonoscopy or flexible sigmoidoscopy):  Excessive amounts of blood in the stool  Significant tenderness or worsening of abdominal pains  Swelling of the abdomen that is new, acute  Fever of 100F or higher   For urgent or emergent issues, a gastroenterologist can be reached at any hour by calling [phone]. Do not use MyChart messaging for urgent concerns.    DIET:  We do recommend a small meal at first, but then you may proceed to your regular diet.  Drink plenty of fluids but you should avoid alcoholic beverages for 24 hours.  ACTIVITY:  You should plan to  take it easy for the rest of today and you should NOT DRIVE or use heavy machinery until tomorrow (because of the sedation medicines used during the test).    FOLLOW UP: Our staff will call the number listed on your records 48-72 hours following your procedure to check on you and address any questions or concerns that you may have regarding the information given to you following your procedure. If we do not reach you, we will leave a message.  We will attempt to reach you two times.  During this call, we will ask if you have developed any symptoms of COVID 19. If you develop any symptoms (ie: fever, flu-like symptoms, shortness of breath, cough etc.) before then, please call [phone].  If you test positive for Covid 19 in the 2 weeks post procedure, please call and report this information to [place].    If any biopsies were taken you will be contacted by phone or by letter within the next 1-3 weeks.  Please call us at [phone] if you have not heard about the biopsies in 3 weeks.    SIGNATURES/CONFIDENTIALITY: You and/or your care partner have signed paperwork which will be entered into your electronic medical record.  These signatures attest to the fact that that the information above on your After Visit Summary has been reviewed and is understood.  Full responsibility of the confidentiality of this discharge information lies with you and/or your care-partner.

## 2022-01-12 NOTE — Progress Notes (Signed)
Pt awake, report to RN, VVS  °

## 2022-01-12 NOTE — Progress Notes (Signed)
[facility] History and Physical   Primary Care Physician:  Eustaquio Boyden, MD   Reason for Procedure:   Colon cancer screening  Plan:    colonoscopy     HPI: Danny [name] is a 51 y.o. male  here for colonoscopy screening - first time exam. Patient denies any bowel symptoms at this time. No family history of colon cancer known. Otherwise feels well without any cardiopulmonary symptoms.    Past Medical History:  Diagnosis Date   Anxiety and depression    states controlled with meds   Arthritis    generalized   COVID-19 virus infection 09/2019   tested positive   Headache(784.0)    sinus headaches   Impaired glucose tolerance    denies diabetes- states glucose elevated x 1 in past   Multiple allergies    seansonal,environmental   Perianal fistula, posterior midline 02/06/2012   Pilonidal cyst     Past Surgical History:  Procedure Laterality Date   ANAL FISTULECTOMY  03/08/12   EUA   ANTERIOR CRUCIATE LIGAMENT REPAIR  2008   right   SHOULDER ARTHROSCOPY WITH ROTATOR CUFF REPAIR AND SUBACROMIAL DECOMPRESSION Right 12/10/2013   Procedure: SHOULDER ARTHROSCOPY WITH SUBACROMIAL DECOMPRESSION, DISTAL CALVICLE RESECTION, OPEN ROTATOR CUFF REPAIR.;  Surgeon: Valeria Batman, MD;  Location: [facility];  Service: Orthopedics;  Laterality: Right;   VASECTOMY  2000   WISDOM TOOTH EXTRACTION     right  lower    Prior to Admission medications   Medication Sig Start Date End Date Taking? Authorizing Provider  ACETAMINOPHEN PO Take 1 tablet by mouth every 8 (eight) hours as needed.   Yes [provider]  citalopram (CELEXA) 20 MG tablet Take 1 tablet (20 mg total) by mouth daily. 06/11/21  Yes Eustaquio Boyden, MD  divalproex (DEPAKOTE) 500 MG DR tablet Take 1 tablet (500 mg total) by mouth 2 (two) times daily. 06/11/21  Yes Eustaquio Boyden, MD  fluticasone [facility]) 50 MCG/ACT nasal spray Place 2 sprays into both nostrils daily. 08/03/20   Yes Eustaquio Boyden, MD    Current Outpatient Medications  Medication Sig Dispense Refill   ACETAMINOPHEN PO Take 1 tablet by mouth every 8 (eight) hours as needed.     citalopram (CELEXA) 20 MG tablet Take 1 tablet (20 mg total) by mouth daily. 90 tablet 3   divalproex (DEPAKOTE) 500 MG DR tablet Take 1 tablet (500 mg total) by mouth 2 (two) times daily. 180 tablet 3   fluticasone (FLONASE) 50 MCG/ACT nasal spray Place 2 sprays into both nostrils daily. 16 g 6   Current Facility-Administered Medications  Medication Dose Route Frequency Provider Last Rate Last Admin   0.9 %  sodium chloride infusion  500 mL Intravenous Once Rhys Lichty, Willaim Rayas, MD        Allergies as of 01/12/2022   (No Known Allergies)    Family History  Problem Relation Age of Onset   Depression Mother    Prostate cancer Father 53   Colon polyps Father 60   Anxiety disorder Father    Heart disease Maternal Grandfather    Cancer Paternal Grandfather        lung   Colon cancer Neg Hx    Esophageal cancer Neg Hx    Rectal cancer Neg Hx    Stomach cancer Neg Hx     Social History   Socioeconomic History   Marital status: Married    Spouse name: Not on file   Number of children:  Not on file   Years of education: Not on file   Highest education level: Not on file  Occupational History   Not on file  Tobacco Use   Smoking status: Former   Smokeless tobacco: Current    Types: Chew  Vaping Use   Vaping Use: Never used  Substance and Sexual Activity   Alcohol use: No    Alcohol/week: 0.0 standard drinks    Comment: 12 pk week--Quit Dec 2017   Drug use: No   Sexual activity: Not on file  Other Topics Concern   Not on file  Social History Narrative   From [place]   Married, 1992   2 kids (daughter [name], son [name])   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  Physical Exam: Vital signs BP (!) 141/66 (BP Location: Right Arm, Patient Position: Sitting, Cuff Size: Normal)    Pulse 70    Temp 98.6 F (37 C) (Temporal)    Ht 5\' 10"  (1.778 m)    Wt 235 lb (106.6 kg)    SpO2 96%    BMI 33.72 kg/m   General:   Alert,  Well-developed, pleasant and cooperative in NAD Lungs:  Clear throughout to auscultation.   Heart:  Regular rate and rhythm Abdomen:  Soft, nontender and nondistended.   Neuro/Psych:  Alert and cooperative. Normal mood and affect. A and O x 3  , MD [facility]

## 2022-01-12 NOTE — Progress Notes (Signed)
Vitals-CW ?

## 2022-01-14 ENCOUNTER — Telehealth: Payer: Self-pay | Admitting: *Deleted

## 2022-01-14 NOTE — Telephone Encounter (Signed)
°  Follow up Call-  Call back number 01/12/2022  Post procedure Call Back phone  # [phone]  Permission to leave phone message Yes  Some recent data might be hidden     Patient questions:  Do you have a fever, pain , or abdominal swelling? No. Pain Score  0 *  Have you tolerated food without any problems? Yes.    Have you been able to return to your normal activities? Yes.    Do you have any questions about your discharge instructions: Diet   No. Medications  No. Follow up visit  No.  Do you have questions or concerns about your Care? No.  Actions: * If pain score is 4 or above: No action needed, pain <4.  Have you developed a fever since your procedure? no  2.   Have you had an respiratory symptoms (SOB or cough) since your procedure? no  3.   Have you tested positive for COVID 19 since your procedure no  4.   Have you had any family members/close contacts diagnosed with the COVID 19 since your procedure?  no   If yes to any of these questions please route to Laverna Peace, RN and Karlton Lemon, RN

## 2022-05-21 ENCOUNTER — Other Ambulatory Visit: Payer: Self-pay | Admitting: Family Medicine

## 2022-05-23 NOTE — Telephone Encounter (Signed)
Left message to call office to find out if he is taking this again or if it an auto refill.

## 2022-05-25 NOTE — Telephone Encounter (Signed)
Spoke to patient by telephone and was advised that he is still taking this mediation. Patient stated that it works for him. Naltrexone Last office visit video 07/12/21 Upcoming appointment 7/258/23

## 2022-05-26 NOTE — Telephone Encounter (Signed)
ERx 

## 2022-06-05 ENCOUNTER — Other Ambulatory Visit: Payer: Self-pay | Admitting: Family Medicine

## 2022-06-05 DIAGNOSIS — F3181 Bipolar II disorder: Secondary | ICD-10-CM

## 2022-06-05 DIAGNOSIS — Z125 Encounter for screening for malignant neoplasm of prostate: Secondary | ICD-10-CM

## 2022-06-05 DIAGNOSIS — E781 Pure hyperglyceridemia: Secondary | ICD-10-CM

## 2022-06-07 ENCOUNTER — Other Ambulatory Visit: Payer: 59

## 2022-06-14 ENCOUNTER — Encounter: Payer: 59 | Admitting: Family Medicine

## 2022-06-24 ENCOUNTER — Other Ambulatory Visit: Payer: Self-pay | Admitting: Family Medicine

## 2022-06-24 NOTE — Telephone Encounter (Signed)
Refill request Celexa Last refill 06/11/21 #90/3 Last office visit 07/12/21 acute No show appointment and cancelled appointment No upcoming appointment scheduled

## 2022-06-27 NOTE — Telephone Encounter (Signed)
Please call patient and schedule an office visit per Dr. Sharen Hones.

## 2022-06-27 NOTE — Telephone Encounter (Signed)
ERx Due for office visit.

## 2022-07-04 ENCOUNTER — Other Ambulatory Visit (INDEPENDENT_AMBULATORY_CARE_PROVIDER_SITE_OTHER): Payer: 59

## 2022-07-04 DIAGNOSIS — E781 Pure hyperglyceridemia: Secondary | ICD-10-CM | POA: Diagnosis not present

## 2022-07-04 DIAGNOSIS — F3181 Bipolar II disorder: Secondary | ICD-10-CM | POA: Diagnosis not present

## 2022-07-04 DIAGNOSIS — Z125 Encounter for screening for malignant neoplasm of prostate: Secondary | ICD-10-CM | POA: Diagnosis not present

## 2022-07-04 LAB — PSA: PSA: 0.59 ng/mL (ref 0.10–4.00)

## 2022-07-04 LAB — COMPREHENSIVE METABOLIC PANEL
ALT: 15 U/L (ref 0–53)
AST: 15 U/L (ref 0–37)
Albumin: 4.3 g/dL (ref 3.5–5.2)
Alkaline Phosphatase: 60 U/L (ref 39–117)
BUN: 15 mg/dL (ref 6–23)
CO2: 29 mEq/L (ref 19–32)
Calcium: 9.4 mg/dL (ref 8.4–10.5)
Chloride: 104 mEq/L (ref 96–112)
Creatinine, Ser: 0.75 mg/dL (ref 0.40–1.50)
GFR: 104.83 mL/min (ref >60.00)
Glucose, Bld: 93 mg/dL (ref 70–99)
Potassium: 4.4 mEq/L (ref 3.5–5.1)
Sodium: 141 mEq/L (ref 135–145)
Total Bilirubin: 0.4 mg/dL (ref 0.2–1.2)
Total Protein: 6.6 g/dL (ref 6.0–8.3)

## 2022-07-04 LAB — LIPID PANEL
Cholesterol: 188 mg/dL (ref 0–200)
HDL: 39.5 mg/dL (ref >39.00)
LDL Cholesterol: 125 mg/dL — ABNORMAL HIGH (ref 0–99)
NonHDL: 148.09
Total CHOL/HDL Ratio: 5
Triglycerides: 116 mg/dL (ref 0.0–149.0)
VLDL: 23.2 mg/dL (ref 0.0–40.0)

## 2022-07-04 LAB — CBC WITH DIFFERENTIAL/PLATELET
Basophils Absolute: 0.1 10*3/uL (ref 0.0–0.1)
Basophils Relative: 1.3 % (ref 0.0–3.0)
Eosinophils Absolute: 0.1 10*3/uL (ref 0.0–0.7)
Eosinophils Relative: 1.2 % (ref 0.0–5.0)
HCT: 45.4 % (ref 39.0–52.0)
Hemoglobin: 15.2 g/dL (ref 13.0–17.0)
Lymphocytes Relative: 32.5 % (ref 12.0–46.0)
Lymphs Abs: 1.9 10*3/uL (ref 0.7–4.0)
MCHC: 33.4 g/dL (ref 30.0–36.0)
MCV: 90.9 fl (ref 78.0–100.0)
Monocytes Absolute: 0.8 10*3/uL (ref 0.1–1.0)
Monocytes Relative: 13.2 % — ABNORMAL HIGH (ref 3.0–12.0)
Neutro Abs: 3 10*3/uL (ref 1.4–7.7)
Neutrophils Relative %: 51.8 % (ref 43.0–77.0)
Platelets: 290 10*3/uL (ref 150.0–400.0)
RBC: 4.99 Mil/uL (ref 4.22–5.81)
RDW: 13.9 % (ref 11.5–15.5)
WBC: 5.7 10*3/uL (ref 4.0–10.5)

## 2022-07-05 ENCOUNTER — Ambulatory Visit: Payer: 59 | Admitting: Behavioral Health

## 2022-07-11 ENCOUNTER — Encounter: Payer: Self-pay | Admitting: Family Medicine

## 2022-07-11 ENCOUNTER — Ambulatory Visit: Payer: 59 | Admitting: Family Medicine

## 2022-07-11 VITALS — BP 120/70 | HR 95 | Temp 97.9°F | Ht 69.5 in | Wt 240.5 lb

## 2022-07-11 DIAGNOSIS — Z8042 Family history of malignant neoplasm of prostate: Secondary | ICD-10-CM

## 2022-07-11 DIAGNOSIS — R5383 Other fatigue: Secondary | ICD-10-CM

## 2022-07-11 DIAGNOSIS — Z0000 Encounter for general adult medical examination without abnormal findings: Secondary | ICD-10-CM

## 2022-07-11 DIAGNOSIS — Z23 Encounter for immunization: Secondary | ICD-10-CM | POA: Diagnosis not present

## 2022-07-11 DIAGNOSIS — F3181 Bipolar II disorder: Secondary | ICD-10-CM | POA: Diagnosis not present

## 2022-07-11 DIAGNOSIS — E781 Pure hyperglyceridemia: Secondary | ICD-10-CM

## 2022-07-11 DIAGNOSIS — F1011 Alcohol abuse, in remission: Secondary | ICD-10-CM

## 2022-07-11 DIAGNOSIS — R41840 Attention and concentration deficit: Secondary | ICD-10-CM | POA: Insufficient documentation

## 2022-07-11 DIAGNOSIS — E669 Obesity, unspecified: Secondary | ICD-10-CM

## 2022-07-11 DIAGNOSIS — F902 Attention-deficit hyperactivity disorder, combined type: Secondary | ICD-10-CM | POA: Insufficient documentation

## 2022-07-11 LAB — TSH: TSH: 2.23 u[IU]/mL (ref 0.35–5.50)

## 2022-07-11 MED ORDER — DIVALPROEX SODIUM ER 500 MG PO TB24: 500.0000 mg | ORAL_TABLET | Freq: Every day | ORAL | 3 refills | Status: DC

## 2022-07-11 MED ORDER — ATOMOXETINE HCL 40 MG PO CAPS: 40.0000 mg | ORAL_CAPSULE | Freq: Every day | ORAL | 3 refills | Status: DC

## 2022-07-11 MED ORDER — NALTREXONE HCL 50 MG PO TABS
50.0000 mg | ORAL_TABLET | Freq: Every day | ORAL | 3 refills | Status: DC
  Filled 2023-03-30: qty 30, 30d supply, fill #0
  Filled 2023-06-14: qty 30, 30d supply, fill #1

## 2022-07-11 MED ORDER — CITALOPRAM HYDROBROMIDE 20 MG PO TABS: 20.0000 mg | ORAL_TABLET | Freq: Every day | ORAL | 3 refills | Status: DC

## 2022-07-11 NOTE — Assessment & Plan Note (Signed)
Chronic, stable period on celexa 20mg  and depakote ER 500mg  nightly - tolerating this better than IR BID. See below.

## 2022-07-11 NOTE — Assessment & Plan Note (Signed)
Preventative protocols reviewed and updated unless pt declined. Discussed healthy diet and lifestyle.  

## 2022-07-11 NOTE — Assessment & Plan Note (Signed)
Reassuring PSA.

## 2022-07-11 NOTE — Assessment & Plan Note (Signed)
Continue to encourage healthy diet and lifestyle choices for sustainable weight loss  

## 2022-07-11 NOTE — Assessment & Plan Note (Signed)
Chronic, improved off medication with healthy diet changes and decreased alcohol intake..  The 10-year ASCVD risk score (Arnett DK, et al., 2019) is: 4%   Values used to calculate the score:     Age: 51 years     Sex: Male     Is Non-Hispanic African American: No     Diabetic: No     Tobacco smoker: No     Systolic Blood Pressure: 120 mmHg     Is BP treated: No     HDL Cholesterol: 39.5 mg/dL     Total Cholesterol: 188 mg/dL

## 2022-07-11 NOTE — Patient Instructions (Addendum)
Labs today Tdap today (tetanus shot) We will refer you to psychology.  Good to see you today Return as needed or in 1 year for next physical.   Health Maintenance, Male Adopting a healthy lifestyle and getting preventive care are important in promoting health and wellness. Ask your health care provider about: The right schedule for you to have regular tests and exams. Things you can do on your own to prevent diseases and keep yourself healthy. What should I know about diet, weight, and exercise? Eat a healthy diet  Eat a diet that includes plenty of vegetables, fruits, low-fat dairy products, and lean protein. Do not eat a lot of foods that are high in solid fats, added sugars, or sodium. Maintain a healthy weight Body mass index (BMI) is a measurement that can be used to identify possible weight problems. It estimates body fat based on height and weight. Your health care provider can help determine your BMI and help you achieve or maintain a healthy weight. Get regular exercise Get regular exercise. This is one of the most important things you can do for your health. Most adults should: Exercise for at least 150 minutes each week. The exercise should increase your heart rate and make you sweat (moderate-intensity exercise). Do strengthening exercises at least twice a week. This is in addition to the moderate-intensity exercise. Spend less time sitting. Even light physical activity can be beneficial. Watch cholesterol and blood lipids Have your blood tested for lipids and cholesterol at 51 years of age, then have this test every 5 years. You may need to have your cholesterol levels checked more often if: Your lipid or cholesterol levels are high. You are older than 51 years of age. You are at high risk for heart disease. What should I know about cancer screening? Many types of cancers can be detected early and may often be prevented. Depending on your health history and family history,  you may need to have cancer screening at various ages. This may include screening for: Colorectal cancer. Prostate cancer. Skin cancer. Lung cancer. What should I know about heart disease, diabetes, and high blood pressure? Blood pressure and heart disease High blood pressure causes heart disease and increases the risk of stroke. This is more likely to develop in people who have high blood pressure readings or are overweight. Talk with your health care provider about your target blood pressure readings. Have your blood pressure checked: Every 3-5 years if you are 1-15 years of age. Every year if you are 32 years old or older. If you are between the ages of 34 and 57 and are a current or former smoker, ask your health care provider if you should have a one-time screening for abdominal aortic aneurysm (AAA). Diabetes Have regular diabetes screenings. This checks your fasting blood sugar level. Have the screening done: Once every three years after age 66 if you are at a normal weight and have a low risk for diabetes. More often and at a younger age if you are overweight or have a high risk for diabetes. What should I know about preventing infection? Hepatitis B If you have a higher risk for hepatitis B, you should be screened for this virus. Talk with your health care provider to find out if you are at risk for hepatitis B infection. Hepatitis C Blood testing is recommended for: Everyone born from [birth year] through 1965. Anyone with known risk factors for hepatitis C. Sexually transmitted infections (STIs) You should be screened each  year for STIs, including gonorrhea and chlamydia, if: You are sexually active and are younger than 51 years of age. You are older than 51 years of age and your health care provider tells you that you are at risk for this type of infection. Your sexual activity has changed since you were last screened, and you are at increased risk for chlamydia or gonorrhea. Ask  your health care provider if you are at risk. Ask your health care provider about whether you are at high risk for HIV. Your health care provider may recommend a prescription medicine to help prevent HIV infection. If you choose to take medicine to prevent HIV, you should first get tested for HIV. You should then be tested every 3 months for as long as you are taking the medicine. Follow these instructions at home: Alcohol use Do not drink alcohol if your health care provider tells you not to drink. If you drink alcohol: Limit how much you have to 0-2 drinks a day. Know how much alcohol is in your drink. In the [place] one drink equals one 12 oz bottle of beer (355 mL), one 5 oz glass of wine (148 mL), or one 1 oz glass of hard liquor (44 mL). Lifestyle Do not use any products that contain nicotine or tobacco. These products include cigarettes, chewing tobacco, and vaping devices, such as e-cigarettes. If you need help quitting, ask your health care provider. Do not use street drugs. Do not share needles. Ask your health care provider for help if you need support or information about quitting drugs. General instructions Schedule regular health, dental, and eye exams. Stay current with your vaccines. Tell your health care provider if: You often feel depressed. You have ever been abused or do not feel safe at home. Summary Adopting a healthy lifestyle and getting preventive care are important in promoting health and wellness. Follow your health care provider's instructions about healthy diet, exercising, and getting tested or screened for diseases. Follow your health care provider's instructions on monitoring your cholesterol and blood pressure. This information is not intended to replace advice given to you by your health care provider. Make sure you discuss any questions you have with your health care provider. Document Revised: 03/29/2021 Document Reviewed: 03/29/2021 [employer].

## 2022-07-11 NOTE — Assessment & Plan Note (Signed)
Just completed intensive outpatient program through [facility].  Has naltrexone to use daily as needed.  Supported to continue full abstinence from alcohol.

## 2022-07-11 NOTE — Progress Notes (Signed)
Patient ID: Danny [name], male    DOB: [birth year], 51 y.o.   MRN: [id number]  This visit was conducted in person.  BP 120/70   Pulse 95   Temp 97.9 F (36.6 C) (Temporal)   Ht 5' 9.5" (1.765 m)   Wt 240 lb 8 oz (109.1 kg)   SpO2 97%   BMI 35.01 kg/m    CC: CPE Subjective:   HPI: Danny [name] is a 51 y.o. male presenting on 07/11/2022 for Annual Exam (Wants to discuss Strattera. Also, c/o feeling tired/sluggish once he sits down. Wants thyroid checked. )   Bipolar 2 disorder - stable period on celexa 20mg  daily with depakote 500mg  bid and hydroxyzine PRN.   Alcohol abuse history - managed with naltrexone PRN. Recently went through intensive outpatient treatment program at [facility]. Mentioned difficulty concentrating at work. Started on Strattera 40mg  daily - working well and desires to continue this. He is also seeing counselor in [place].   Ongoing daytime somnolence.   Pt endorses persistent pattern of inattention and or hyperactivity/impulsivity that interferes with daily functioning. Symptoms present in 2 or more settings. Symptoms present before 51 years of age? yes Inattention (6+, 6 months): Careless mistakes? yes Difficulty sustaining attention? yes Doesn't listen when spoken to directly? yes Lacks follow through with instructions or difficulty completing tasks? no Difficulty organizing tasks/activities? no Avoiding tasks that require sustained attention? yes Easily losing things needed for tasks? yes Easily distracted by external stimuli? yes Forgetful in daily activities? yes Hyperactive/impulsive (6+, 6 months): Fidgeting, tapping hands/feet? no Leaves seat when expected to remain in seat? no Feeling restless, or running around when not expected? no Unable to engage in leisurely activities quietly? no Always on the go, "driven by a motor"? no Excessive talking? no Blurting out answer, responds to other's questions? no Difficulty waiting in  line or waiting turn? no Interrupting or intruding on others? no   Preventative: Colonoscopy 12/2021 - diverticulosis, int hem, rpt 10 yrs ([facility]) Prostate cancer screening - yearly screen. [facility] prostate cancer (father age 28). No nocturia or weak stream.  Lung cancer screening - not eligible  Flu shot - declines COVID vaccine - declines  Tdap 2010. Tdap today  Seat belt use discussed.  Sunscreen use discussed. No changing moles on skin.  Ex smoker. Quit 2011, some chewing.  H/o alcohol abuse - largely abstinent since 09/2019, recently completed 6 wk intensive outpatient program Dentist q6 mo Eye exam yearly    From [place] Married, 1992 Lives with wife and 2 kids (daughter 10/2019, son Danny [name])  Occ: Works for [employer] (contracting) Activity: no regular exercise, walking some, yard work  Diet: some milk, good water, some fruits/vegetables      Relevant past medical, surgical, family and social history reviewed and updated as indicated. Interim medical history since our last visit reviewed. Allergies and medications reviewed and updated. Outpatient Medications Prior to Visit  Medication Sig Dispense Refill   ACETAMINOPHEN PO Take 1 tablet by mouth every 8 (eight) hours as needed.     fluticasone (FLONASE) 50 MCG/ACT nasal spray Place 2 sprays into both nostrils daily. (Patient taking differently: Place 2 sprays into both nostrils daily. As needed) 16 g 6   atomoxetine (STRATTERA) 40 MG capsule      citalopram (CELEXA) 20 MG tablet TAKE 1 TABLET (20 MG TOTAL) BY MOUTH DAILY. 30 tablet 1   divalproex (DEPAKOTE ER) 500 MG 24 hr tablet at bedtime.  naltrexone (DEPADE) 50 MG tablet TAKE 1 TABLET (50 MG TOTAL) BY MOUTH DAILY. 30 tablet 3   divalproex (DEPAKOTE) 500 MG DR tablet Take 1 tablet (500 mg total) by mouth 2 (two) times daily. 180 tablet 3   No facility-administered medications prior to visit.     Per HPI unless specifically indicated in ROS section  below Review of Systems  Constitutional:  Negative for activity change, appetite change, chills, fatigue, fever and unexpected weight change.  HENT:  Negative for hearing loss.   Eyes:  Negative for visual disturbance.  Respiratory:  Negative for cough, chest tightness, shortness of breath and wheezing.   Cardiovascular:  Negative for chest pain, palpitations and leg swelling.  Gastrointestinal:  Negative for abdominal distention, abdominal pain, blood in stool, constipation, diarrhea, nausea and vomiting.  Genitourinary:  Negative for difficulty urinating and hematuria.  Musculoskeletal:  Negative for arthralgias, myalgias and neck pain.       R knee swelling  Skin:  Negative for rash.  Neurological:  Negative for dizziness, seizures, syncope and headaches.  Hematological:  Negative for adenopathy. Does not bruise/bleed easily.  Psychiatric/Behavioral:  Negative for dysphoric mood. The patient is not nervous/anxious.     Objective:  BP 120/70   Pulse 95   Temp 97.9 F (36.6 C) (Temporal)   Ht 5' 9.5" (1.765 m)   Wt 240 lb 8 oz (109.1 kg)   SpO2 97%   BMI 35.01 kg/m   Wt Readings from Last 3 Encounters:  07/11/22 240 lb 8 oz (109.1 kg)  01/12/22 235 lb (106.6 kg)  12/29/21 235 lb (106.6 kg)      Physical Exam Vitals and nursing note reviewed.  Constitutional:      General: He is not in acute distress.    Appearance: Normal appearance. He is well-developed. He is not ill-appearing.  HENT:     Head: Normocephalic and atraumatic.     Right Ear: Hearing, tympanic membrane, ear canal and external ear normal.     Left Ear: Hearing, tympanic membrane, ear canal and external ear normal.  Eyes:     General: No scleral icterus.    Extraocular Movements: Extraocular movements intact.     Conjunctiva/sclera: Conjunctivae normal.     Pupils: Pupils are equal, round, and reactive to light.  Neck:     Thyroid: No thyroid mass or thyromegaly.  Cardiovascular:     Rate and Rhythm:  Normal rate and regular rhythm.     Pulses: Normal pulses.          Radial pulses are 2+ on the right side and 2+ on the left side.     Heart sounds: Normal heart sounds. No murmur heard. Pulmonary:     Effort: Pulmonary effort is normal. No respiratory distress.     Breath sounds: Normal breath sounds. No wheezing, rhonchi or rales.  Abdominal:     General: Bowel sounds are normal. There is no distension.     Palpations: Abdomen is soft. There is no mass.     Tenderness: There is no abdominal tenderness. There is no guarding or rebound.     Hernia: No hernia is present.  Musculoskeletal:        General: Normal range of motion.     Cervical back: Normal range of motion and neck supple.     Right lower leg: No edema.     Left lower leg: No edema.  Lymphadenopathy:     Cervical: No cervical adenopathy.  Skin:  General: Skin is warm and dry.     Findings: No rash.  Neurological:     General: No focal deficit present.     Mental Status: He is alert and oriented to person, place, and time.  Psychiatric:        Mood and Affect: Mood normal.        Behavior: Behavior normal.        Thought Content: Thought content normal.        Judgment: Judgment normal.       Results for orders placed or performed in visit on 07/04/22  PSA  Result Value Ref Range   PSA 0.59 0.10 - 4.00 ng/mL  CBC with Differential/Platelet  Result Value Ref Range   WBC 5.7 4.0 - 10.5 K/uL   RBC 4.99 4.22 - 5.81 Mil/uL   Hemoglobin 15.2 13.0 - 17.0 g/dL   HCT 02.7 25.3 - 66.4 %   MCV 90.9 78.0 - 100.0 fl   MCHC 33.4 30.0 - 36.0 g/dL   RDW 40.3 47.4 - 25.9 %   Platelets 290.0 150.0 - 400.0 K/uL   Neutrophils Relative % 51.8 43.0 - 77.0 %   Lymphocytes Relative 32.5 12.0 - 46.0 %   Monocytes Relative 13.2 (H) 3.0 - 12.0 %   Eosinophils Relative 1.2 0.0 - 5.0 %   Basophils Relative 1.3 0.0 - 3.0 %   Neutro Abs 3.0 1.4 - 7.7 K/uL   Lymphs Abs 1.9 0.7 - 4.0 K/uL   Monocytes Absolute 0.8 0.1 - 1.0 K/uL    Eosinophils Absolute 0.1 0.0 - 0.7 K/uL   Basophils Absolute 0.1 0.0 - 0.1 K/uL  Lipid panel  Result Value Ref Range   Cholesterol 188 0 - 200 mg/dL   Triglycerides 563.8 0.0 - 149.0 mg/dL   HDL 75.64 >33.29 mg/dL   VLDL 51.8 0.0 - 84.1 mg/dL   LDL Cholesterol 660 (H) 0 - 99 mg/dL   Total CHOL/HDL Ratio 5    NonHDL 148.09   Comprehensive metabolic panel  Result Value Ref Range   Sodium 141 135 - 145 mEq/L   Potassium 4.4 3.5 - 5.1 mEq/L   Chloride 104 96 - 112 mEq/L   CO2 29 19 - 32 mEq/L   Glucose, Bld 93 70 - 99 mg/dL   BUN 15 6 - 23 mg/dL   Creatinine, Ser 6.30 0.40 - 1.50 mg/dL   Total Bilirubin 0.4 0.2 - 1.2 mg/dL   Alkaline Phosphatase 60 39 - 117 U/L   AST 15 0 - 37 U/L   ALT 15 0 - 53 U/L   Total Protein 6.6 6.0 - 8.3 g/dL   Albumin 4.3 3.5 - 5.2 g/dL   GFR 160.10 >93.23 mL/min   Calcium 9.4 8.4 - 10.5 mg/dL   Lab Results  Component Value Date   TSH 2.51 05/07/2020    Assessment & Plan:   Problem List Items Addressed This Visit     Health maintenance examination - Primary (Chronic)    Preventative protocols reviewed and updated unless pt declined. Discussed healthy diet and lifestyle.       Bipolar 2 disorder (HCC)    Chronic, stable period on celexa 20mg  and depakote ER 500mg  nightly - tolerating this better than IR BID. See below.       Obesity, Class I, BMI 30.0-34.9 (see actual BMI)    Continue to encourage healthy diet and lifestyle choices for sustainable weight loss.       History of alcohol abuse  Just completed intensive outpatient program through [facility].  Has naltrexone to use daily as needed.  Supported to continue full abstinence from alcohol.       Hypertriglyceridemia    Chronic, improved off medication with healthy diet changes and decreased alcohol intake..  The 10-year ASCVD risk score (Arnett DK, et al., 2019) is: 4%   Values used to calculate the score:     Age: 61 years     Sex: Male     Is Non-Hispanic African  American: No     Diabetic: No     Tobacco smoker: No     Systolic Blood Pressure: 120 mmHg     Is BP treated: No     HDL Cholesterol: 39.5 mg/dL     Total Cholesterol: 188 mg/dL       Family history of prostate cancer in father    Reassuring PSA.       Inattention    Longstanding issue, has not seeked evaluation for this. Screens positive for ADD.  Recently started on Strattera with benefit. Discussed stimulant use, he may be interested. Will refer to psychology for formal ADHD testing. He will check if his current psychologist does this testing.       Relevant Orders   Ambulatory referral to Psychology   Other Visit Diagnoses     Fatigue, unspecified type       Relevant Orders   TSH   Need for Tdap vaccination       Relevant Orders   Tdap vaccine greater than or equal to 7yo IM (Completed)        Meds ordered this encounter  Medications   citalopram (CELEXA) 20 MG tablet    Sig: Take 1 tablet (20 mg total) by mouth daily.    Dispense:  90 tablet    Refill:  3   naltrexone (DEPADE) 50 MG tablet    Sig: Take 1 tablet (50 mg total) by mouth daily.    Dispense:  90 tablet    Refill:  3   divalproex (DEPAKOTE ER) 500 MG 24 hr tablet    Sig: Take 1 tablet (500 mg total) by mouth at bedtime.    Dispense:  90 tablet    Refill:  3   atomoxetine (STRATTERA) 40 MG capsule    Sig: Take 1 capsule (40 mg total) by mouth daily.    Dispense:  90 capsule    Refill:  3   Orders Placed This Encounter  Procedures   Tdap vaccine greater than or equal to 7yo IM   TSH   Ambulatory referral to Psychology    Referral Priority:   Routine    Referral Type:   Psychiatric    Referral Reason:   Specialty Services Required    Requested Specialty:   Psychology    Number of Visits Requested:   1     Patient instructions: Labs today Tdap today (tetanus shot) We will refer you to psychology.  Good to see you today Return as needed or in 1 year for next physical.   Follow up  plan: Return in about 1 year (around 07/12/2023) for annual exam, prior fasting for blood work.  Eustaquio Boyden, MD

## 2022-07-11 NOTE — Assessment & Plan Note (Signed)
Longstanding issue, has not seeked evaluation for this. Screens positive for ADD.  Recently started on Strattera with benefit. Discussed stimulant use, he may be interested. Will refer to psychology for formal ADHD testing. He will check if his current psychologist does this testing.

## 2022-07-12 NOTE — Addendum Note (Signed)
Addended by: Eustaquio Boyden on: 07/12/2022 04:57 PM   Modules accepted: Orders

## 2022-07-21 ENCOUNTER — Telehealth: Payer: Self-pay | Admitting: Family Medicine

## 2022-07-21 NOTE — Telephone Encounter (Signed)
Patient called in stating that he has a sinus infection and has been taking Tylenol sinus but they aren't helping. He was wondering if Dr. Reece Agar could send something in for him to take for this. Thank you!

## 2022-07-21 NOTE — Telephone Encounter (Signed)
Spoke with pt offering to schedule OV tomorrow since nothing available today.  Pt declined stating he will go on to an UC after work.

## 2022-08-31 ENCOUNTER — Encounter: Payer: Self-pay | Admitting: Orthopaedic Surgery

## 2022-08-31 ENCOUNTER — Ambulatory Visit: Payer: 59 | Admitting: Orthopaedic Surgery

## 2022-08-31 DIAGNOSIS — M1711 Unilateral primary osteoarthritis, right knee: Secondary | ICD-10-CM

## 2022-08-31 MED ORDER — BUPIVACAINE HCL 0.25 % IJ SOLN
2.0000 mL | INTRAMUSCULAR | Status: AC | PRN
  Administered 2022-08-31: 2 mL via INTRA_ARTICULAR

## 2022-08-31 MED ORDER — LIDOCAINE HCL 1 % IJ SOLN
2.0000 mL | INTRAMUSCULAR | Status: AC | PRN
  Administered 2022-08-31: 2 mL

## 2022-08-31 MED ORDER — METHYLPREDNISOLONE ACETATE 40 MG/ML IJ SUSP
80.0000 mg | INTRAMUSCULAR | Status: AC | PRN
  Administered 2022-08-31: 80 mg via INTRA_ARTICULAR

## 2022-08-31 NOTE — Progress Notes (Signed)
Office Visit Note   Patient: Danny [name]           Date of Birth: Apr 13, 1971           MRN: [id number] Visit Date: 08/31/2022              Requested by: Eustaquio Boyden, MD [address] [place],  [place] [address] PCP: Eustaquio Boyden, MD   Assessment & Plan: Visit Diagnoses:  1. Primary osteoarthritis of right knee     Plan: Mr. Sorbo has a history of osteoarthritis right knee that is responded very well to occasional injections of cortisone.  He has had prior ACL reconstruction and follow-up x-ray and MRI scan consistent with osteoarthritis.  He was last seen in October 2022 and received a cortisone injection.  Over the weekend he had some recurrence of his pain with swelling.  He notes the swelling is subsided but he still having some discomfort mostly along the medial aspect of his knee.  There is been no history of injury or trauma.  Long discussion regarding his arthritis and what he may expect over time and different treatment options including repeat cortisone and even knee replacement.  He would like to try the cortisone today  Follow-Up Instructions: Return if symptoms worsen or fail to improve.   Orders:  Orders Placed This Encounter  Procedures   Large Joint Inj: R knee   No orders of the defined types were placed in this encounter.     Procedures: Large Joint Inj: R knee on 08/31/2022 9:00 AM Indications: pain and diagnostic evaluation Details: 25 G 1.5 in needle, anteromedial approach  Arthrogram: No  Medications: 2 mL lidocaine 1 %; 80 mg methylPREDNISolone acetate 40 MG/ML; 2 mL bupivacaine 0.25 % Procedure, treatment alternatives, risks and benefits explained, specific risks discussed. Consent was given by the patient. Immediately prior to procedure a time out was called to verify the correct patient, procedure, equipment, support staff and site/side marked as required. Patient was prepped and draped in the usual sterile fashion.        Clinical Data: No additional findings.   Subjective: Chief Complaint  Patient presents with   Right Knee - Pain, Follow-up  Of osteoarthritis right knee with prior ACL reconstruction of follow-up x-rays and MRI scans consistent with osteoarthritis.  Has done very well with cortisone in the past and would like to have another injection today.  Had an exacerbation of his pain over the weekend without injury or trauma  HPI  Review of Systems   Objective: Vital Signs: There were no vitals taken for this visit.  Physical Exam Constitutional:      Appearance: He is well-developed.  Eyes:     Pupils: Pupils are equal, round, and reactive to light.  Pulmonary:     Effort: Pulmonary effort is normal.  Skin:    General: Skin is warm and dry.  Neurological:     Mental Status: He is alert and oriented to person, place, and time.  Psychiatric:        Behavior: Behavior normal.     Ortho Exam awake alert and oriented x3.  Comfortable sitting.  Right knee was not hot red warm or swollen.  No effusion.  Mostly medial joint pain.  Full quick extension flexed over 100 degrees.  No opening with varus or valgus stress.  No popliteal pain or mass.  No calf pain.  Motor exam intact.  Walks without a limp.  Minimal patella crepitation  but no pain with compression  Specialty Comments:  No specialty comments available.  Imaging: No results found.   PMFS History: Patient Active Problem List   Diagnosis Date Noted   Inattention 07/11/2022   Daytime somnolence 05/19/2020   Primary osteoarthritis of right knee 05/24/2019   Family history of prostate cancer in father 02/03/2019   Neuropathy 02/06/2017   Hypertriglyceridemia 10/08/2016   History of alcohol abuse 07/15/2016   Obesity, Class I, BMI 30.0-34.9 (see actual BMI) 10/12/2015   Health maintenance examination 10/12/2015   OCD (obsessive compulsive disorder) 08/06/2015   Chronic congestion of paranasal sinus 08/06/2015    Bipolar 2 disorder (HCC)    Past Medical History:  Diagnosis Date   Anxiety and depression    states controlled with meds   Arthritis    generalized   COVID-19 virus infection 09/2019   tested positive   Headache(784.0)    sinus headaches   Impaired glucose tolerance    denies diabetes- states glucose elevated x 1 in past   Multiple allergies    seansonal,environmental   Perianal fistula, posterior midline 02/06/2012   Pilonidal cyst     Family History  Problem Relation Age of Onset   Depression Mother    Prostate cancer Father 38   Colon polyps Father 26   Anxiety disorder Father    Heart disease Maternal Grandfather    Cancer Paternal Grandfather        lung   Colon cancer Neg Hx    Esophageal cancer Neg Hx    Rectal cancer Neg Hx    Stomach cancer Neg Hx     Past Surgical History:  Procedure Laterality Date   ANAL FISTULECTOMY  03/08/2012   EUA   ANTERIOR CRUCIATE LIGAMENT REPAIR  11/21/2006   right   COLONOSCOPY  12/2021   diverticulosis, int hem, rpt 10 yrs ([facility])   SHOULDER ARTHROSCOPY WITH ROTATOR CUFF REPAIR AND SUBACROMIAL DECOMPRESSION Right 12/10/2013   Procedure: SHOULDER ARTHROSCOPY WITH SUBACROMIAL DECOMPRESSION, DISTAL CALVICLE RESECTION, OPEN ROTATOR CUFF REPAIR.;  Surgeon: Garald Balding, MD;  Location: [facility];  Service: Orthopedics;  Laterality: Right;   VASECTOMY  11/21/1998   WISDOM TOOTH EXTRACTION     right  lower   Social History   Occupational History   Not on file  Tobacco Use   Smoking status: Former   Smokeless tobacco: Current    Types: Chew  Vaping Use   Vaping Use: Never used  Substance and Sexual Activity   Alcohol use: No    Alcohol/week: 0.0 standard drinks of alcohol    Comment: 12 pk week--Quit Dec 2017   Drug use: No   Sexual activity: Not on file     Garald Balding, MD   Note - This record has been created using [employer].  Chart creation errors have been sought, but may not  always  have been located. Such creation errors do not reflect on  the standard of medical care.

## 2022-10-26 ENCOUNTER — Ambulatory Visit: Payer: 59 | Admitting: Psychology

## 2022-10-26 DIAGNOSIS — F89 Unspecified disorder of psychological development: Secondary | ICD-10-CM | POA: Diagnosis not present

## 2022-10-26 NOTE — Progress Notes (Addendum)
Date: 10/26/2022 Appointment Start Time: 5pm Duration: 105 minutes Provider: Clarice Pole, PsyD Type of Session: Initial Appointment for Evaluation  Location of Patient: Home Location of Provider: Provider's Home (private office) Type of Contact: Caregility video visit with audio  Session Content:  Prior to proceeding with today's appointment, two pieces of identifying information were obtained from Danny Danny [name] to verify identity. In addition, Danny Danny [name]'s physical location at the time of this appointment was obtained. In the event of technical difficulties, Danny [name] shared a phone number he could be reached at. Danny Danny [name] and this provider participated in today's telepsychological service. Danny Danny [name] denied anyone else being present in the room or on the virtual appointment.  The provider's role was explained to Danny Danny [name]. The provider reviewed and discussed issues of confidentiality, privacy, and limits therein (e.g., reporting obligations). In addition to verbal informed consent, written informed consent for psychological services was obtained from Danny Danny [name] prior to the initial appointment. Written consent included information concerning the practice, financial arrangements, and confidentiality and patients' rights. Since the clinic is not a 24/7 crisis center, mental health emergency resources were shared, and the provider explained e-mail, voicemail, and/or other messaging systems should be utilized only for non-emergency reasons. This provider also explained that information obtained during appointments will be placed in their electronic medical record in a confidential manner. Danny [name] verbally acknowledged understanding of the aforementioned and agreed to use mental health emergency resources discussed if needed. Moreover, Danny [name] agreed information may be shared with other Danny Danny [name] or their referring provider(s) as needed for coordination of care. By signing the new patient documents, Danny [name] provided written consent for  coordination of care. Danny [name] verbally acknowledged understanding he is ultimately responsible for understanding his insurance benefits as it relates to reimbursement of telepsychological and in-person services. This provider also reviewed confidentiality, as it relates to telepsychological services, as well as the rationale for telepsychological services. This provider further explained that video should not be captured, photos should not be taken, nor should testing stimuli be copied or recorded as it would be a copyright violation. Danny [name] expressed understanding of the aforementioned, and verbally consented to proceed.  Danny [name] completed the psychiatric diagnostic evaluation (history, including past, family, social, and  psychiatric history; behavioral observations; and establishment of a provisional diagnosis). The evaluation was completed in 105 minutes. Code [id number] was billed.   Mental Status Examination:  Appearance:  neat Behavior: appropriate to circumstances Mood: neutral Affect: mood congruent Speech: WNL Eye Contact: appropriate Psychomotor Activity: restless Thought Process: Occasionally perseverative on concentration issues and other's commenting on his various behaviors Content/Perceptual Disturbances: none Orientation: AAOx4 Cognition/Sensorium: intact Insight: fair Judgment: fair  Provisional DSM-5 diagnosis(es):  F89 Unspecified Disorder of Psychological Development   Plan: Danny [name] is currently scheduled for an appointment on 11/04/2022 at 2pm via WebEx video visit with audio.      CONFIDENTIAL PSYCHOLOGICAL EVALUATION ______________________________________________________________________________  Name: Danny Danny [name]   Date of Birth: [birth year]    Age: 51 Dates of Evaluation: 10/26/2022  SOURCE AND REASON FOR REFERRAL: Danny Danny [name] was referred by Dr. Ria [name] for an evaluation to ascertain if he meets criteria for Attention Deficit/Hyperactivity Disorder (ADHD).     BACKGROUND INFORMATION AND PRESENTING PROBLEM: Danny Danny [name] is a 51 year old male who resides in [place]  Danny Danny [name] verbally consented to, and expressed a preference for, his wife being present for the clinical interview. As a result, information obtained during the interview was provided by Danny Danny [name] and his wife.   Danny Danny [name] reported "concentration issues [  he has] have been [his] biggest thing," noting he has "always" had a "problem with paying attention" but increased responsibilities, "multitasking," and sustained attention requirements at his new employment position are negatively impacting his employment and daily functioning. He further reported he has been utilizing Strattera and that it "worked okay" for approximately a month and continues to "calm [him]" but he is now on the "highest dose," "cannot even tell [he is] taking it," and his "concentration issues" have not improved. He, with his wife's corroboration and occasional providing of additional information that Danny Danny [name] largely agreed with, described his ADHD-related concerns as including being easily distracted by various stimuli (e.g., his phone, sounds, thoughts, and visuals); regular forgetfulness (e.g., what other's told him, where he left off on a task, his planned driving destination, and occasional misplacing of items); difficulty listening to others even when they are speaking directly to him and despite efforts to do so, which his wife commented on and expressed frustration with; task initiation (e.g., putting off tasks despite reminders from his wife, relational strains caused by doing so, and having received comments from his coworkers that he is "always bouncing around") and maintenance (e.g., becoming distracted by other tasks and starting them prior to completing the initial task) issues; missing small details (e.g., details of what he is "supposed to do" and writing errors despite proofreading attempts)  which contributes to making mistakes; trouble keeping organized which can cause clutter with his wife stating he "just drops things;" experiencing irritability if plans are changed, someone is driving in a way he dislikes, multiple people are trying to talk to him at one time or he feels "pulled in multiple directions," or someone breaks his attention from a task given the effort required to "get back" to the task; having a hard time following through with plans as he is prone to forgetting them without reminders as well as trouble adjusting to changes in plans; urges to leave meetings, although he shared use of psychotropic medication has been helpful in reducing discomfort caused by situations in which he is expected to stay seated; engaging in excessive talking, noting he previously was more reserved but the calming effects of his current psychotropic medications seems to have been contributed to lessening social-related worries; his wife noting he "sporadic[ally] changes topics while he is speaking; interrupting others as he is "thinking about other things," which can also lead to him changing the conversation subject during an interruption; trouble waiting (e.g., becoming aggravated if someone is driving slower than he would like and becoming impatient in lines), although he expressed a belief he has become more patient with use of psychotropic medication; regularly driving [id number] over the speed limit and his wife noting she becomes distressed by him driving "too fast" and close to other cars; being prone to starting tasks without fully understanding the directions as he dislikes and has trouble comprehending them, which can lead to mistakes being made and an eventual need to read the instructions; difficulty following proper sequence of tasks as he tends to "skip around;" and issues following through on promises and commitments he makes to others as he is prone to forgetting them. He also described a history  of depressive episodes that use of psychotropic has been helpful for, problematic alcohol use that he has utilized mental health services and medication to assist with maintaining cessation, generalized worry that he noted is uncontrollable at times, OCD-related symptomatology (e.g., checking behaviors, efforts to avoid germs and a desire for cleanliness, and  wanting things to be a certain way), sleep onset issues that have improved with use of psychotropic medication, possible sleep apnea (e.g., snoring loudly, daytime sleepiness, and his PCP suggesting he undergo a sleep study for sleep apnea), overeating-related behaviors (e.g., eating more than usual and to the point of physical discomfort) without compensatory strategies, and periods of suicidal ideation without plan or intent during periods of significant alcohol use in the past.  He expressed a belief his ADHD-related concerns are consistent and independent of mood, but can become exacerbated by increased responsibilities, multitasking, "feeling rushed," and reduced sleep. He stated his coping strategies include writing things down, using a calendar and reminders, keeping items in a designated place, checking his work, utilizing his wife's support, and expressing gratitude.  Danny Danny [name] denied awareness of experiencing any developmental milestone delays or grade retention. He discussed throughout schooling he prioritized "social" aspects versus his academic performance, and that he did the "bare minimum" to pass a class (e.g., writing a book report based on the "back cover" of a book). He also discussed a history of reading comprehension issues that he attributed to having a hard time understanding the written concepts and regularly being distracted while reading. 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 He denied a history of employment disciplinary actions, although shared how coworkers have commented on his forgetfulness and "bouncing around" between tasks.   Danny Danny [name] reported his medical history is significant for headaches and "sinus problems," adding his thyroid and A1c levels were checked and were "fine." He denied awareness of ever experiencing seizures or a head injury. He shared he previously met with "therapists" for depression that he attributed to "a lot of" alcohol use at those times as well as utilizing [facility] for problematic alcohol use. He reported his current alcohol use has significantly reduced as he can go months without using alcohol, but he occasionally engages in binge drinking with the last instance being one month. He also reported daily use of "dip" tobacco and daily use of two energy drinks, one soda, and one-to-two glasses of tea. He expressed a belief caffeine "doesn't feel like it has an effect" but he is "trying to stay alert." He denied ever experiencing psychiatric hospitalization or meeting full criteria for trauma- and stressor-related disorder; homicidal ideation, plan, or intent; and legal involvement. He noted his familial mental health history is significant for his father having "depression," "controlling" behaviors, and utilizing an unspecified psychotropic medication; his paternal grandmother having anger issues; and his mother being "passive," experiencing "depression," and possibly havin ADHD (e.g., demonstrated task initiation issues and was frequently "distracted").   Chart review: On 07/11/22 Dr. Danise Mina noted Danny Danny [name] has "longstanding issue [of inattention], has not seeked evaluation for this. Screens positive for ADD. Recently started on Strattera with benefit. Discussed stimulant use, he may be interested. Will refer to psychology for formal ADHD testing. He will  check if his current psychologist does this testing" and that he has a history of Bipolar II Disorder but is currently in a "stable period," a history of "alcohol abuse," having recently completed an intensive outpatient program through [facility], and uses Naltrexone" as needed.             Dolores Lory, PsyD

## 2022-11-04 ENCOUNTER — Ambulatory Visit: Payer: 59 | Admitting: Psychology

## 2022-11-04 DIAGNOSIS — F89 Unspecified disorder of psychological development: Secondary | ICD-10-CM

## 2022-11-04 NOTE — Progress Notes (Signed)
Date: 11/04/2022   Appointment Start Time: 2pm Duration: 55 minutes Provider: Helmut Muster, PsyD Type of Session: Testing Appointment for Evaluation  Location of Patient: Home Location of Provider: Provider's Home (private office) Type of Contact: WebEx video visit with audio  Session Content: Today's appointment was a telepsychological visit due to COVID-19. Danny [name] is aware it is his responsibility to secure confidentiality on his end of the session. Prior to proceeding with today's appointment, Danny [name]'s physical location at the time of this appointment was obtained as well a phone number he could be reached at in the event of technical difficulties. Danny [name] denied anyone else being present in the room or on the virtual appointment. This provider reviewed that video should not be captured, photos should not be taken, nor should testing stimuli be copied or recorded as it would be a copyright violation. Che expressed understanding of the aforementioned, and verbally consented to proceed. The WAIS-IV was administered, scored, and interpreted by this evaluator  Billing codes will be input on the feedback appointment. There are no billing codes for the testing appointment.   Provisional DSM-5 diagnosis(es):  F89 Unspecified Disorder of Psychological Development   Plan: Danny [name] was scheduled for a feedback appointment on 11/10/2022 at 1pm via WebEx video visit with audio.                Margarite Gouge, PsyD

## 2022-11-10 ENCOUNTER — Ambulatory Visit (INDEPENDENT_AMBULATORY_CARE_PROVIDER_SITE_OTHER): Payer: 59 | Admitting: Psychology

## 2022-11-10 DIAGNOSIS — F902 Attention-deficit hyperactivity disorder, combined type: Secondary | ICD-10-CM | POA: Diagnosis not present

## 2022-11-10 DIAGNOSIS — F1020 Alcohol dependence, uncomplicated: Secondary | ICD-10-CM

## 2022-11-10 NOTE — Progress Notes (Signed)
Testing and Report Writing Information: The following measures  were administered, scored, and interpreted by this provider:  Generalized Anxiety Disorder-7 (GAD-7; 5 minutes), Patient Health Questionnaire-9 (PHQ-9; 5 minutes), Wechsler Adult Intelligence Scale-Fourth Edition (WAIS-IV; 70 minutes), CNS Vital Signs (45 minutes), Adult Attention Deficit/Hyperactivity Disorder Self-Report Scale Checklist (ASRSv1.1; 15 minutes), Behavior Rating Inventory for Executive Function - A - Self Report (BRIEF A; 10 minutes) and Behavior Rating Inventory for Executive Function - A - Informant  (BRIEF-A; 10 minutes),[place] Sleep Quality Index (PSQI; 15 minutes), Adult OCD Inventory (OCD-A) SF-20 (15 minutes), Alcohol Use Disorders Identification Test (10 minutes) SF-20 Personality Assessment Inventory (PAI; 50 minutes). A total of 250 (minutes was spent on the administration and scoring of the aforementioned measures. Codes [phone] and [phone] (6 units - this evaluator purposely charged less billing codes than time requirements allotted for) were billed.  Please see the assessment for additional details. This provider completed the written report which includes integration of patient data, interpretation of standardized test results, interpretation of clinical data, review of information provided by Danny [name] and any collateral information/documentation, and clinical decision making (315 minutes in total).  Feedback Appointment: Date: 11/10/2022 Appointment Start Time: 1:05pm Duration: 55 minutes Provider: Clarice Pole, PsyD Type of Session: Feedback Appointment for Evaluation  Location of Patient: Work Location of Provider: [place] (private office) Type of Contact: WebEx video visit with audio  Session Content: Today's appointment was a telepsychological visit due to COVID-19. [name] is aware it is his responsibility to secure confidentiality on his end of the session. He provided verbal consent to proceed  with today's appointment. Prior to proceeding with today's appointment, [name]'s physical location at the time of this appointment was obtained as well a phone number he could be reached at in the event of technical difficulties. [name] denied anyone else being present in the room or on the virtual appointment.  This provider and Danny [name] completed the interactive feedback session which includes reviewing the aforementioned measures, treatment recommendations, and diagnostic conclusions.   The interactive feedback session was completed today and a total of 55 minutes was spent on feedback. Code [phone] was billed for feedback session.   DSM-5 Diagnosis(es):  F90.2 Attention-Deficit/Hyperactivity Disorder, Combined Presentation, Moderate F10.20 Alcohol Use Disorder, Moderate  Time Requirements: Assessment scoring and interpreting: 250 minutes (billing code [phone] and [phone] [6 units - this evaluator purposely charged less billing codes than time requirements allotted for]) Feedback: 55 minutes (billing code [phone]) Report writing: 315 total minutes. 10/27/2022: 5:55-6:45pm. 10/28/2022: 8:25-9am and 3:50-4:05pm and 4:10-4:25pm. 11/01/2022: 7:55-8:35pm. 11/02/2022: 8:30-9am. 11/04/2022: 8:50-9:20am and 4:30-4:55pm. 11/06/2022: 8:30-9:15pm. 11/07/2022: 8:10-8:40pm.  (billing code [phone] [5 units])  Plan: Danny [name] provided verbal consent for his evaluation to be sent via e-mail. No further follow-up planned by this provider.       CONFIDENTIAL PSYCHOLOGICAL EVALUATION ______________________________________________________________________________  Name: Danny [name]   Date of Birth: [birth year]    Age: 51 Dates of Evaluation: 10/26/2022, 10/27/2022, 10/31/2022, 11/02/2022, and 11/04/2022  SOURCE AND REASON FOR REFERRAL: [name] was referred by Dr. Ria [name] for an evaluation to ascertain if he meets criteria for Attention Deficit/Hyperactivity Disorder (ADHD).   EVALUATIVE PROCEDURES: Clinical  Interview with [name] (10/26/2022) Wechsler Adult Intelligence Scale-Fourth Edition (WAIS-IV; 11/04/2022) CNS Vital Signs (10/31/2022)  Adult Attention Deficit/Hyperactivity Disorder Self-Report Scale Checklist (10/31/2022) Behavior Rating Inventory for Executive Function - A - Self Report Behavior Rating Inventory for Executive Function - A - Self Report (BRIEF-A; 11/02/2022) and Informant (10/27/2022) Personality Assessment Inventory (PAI; 11/02/2022) Patient  Health Questionnaire-9 (PHQ-9) Generalized Anxiety Disorder-7 (GAD-7) [place] Sleep Quality Index (PSQI; 10/31/2022) Adult OCD Inventory (OCD-A) SF-20 (10/31/2022) Alcohol Use Disorders Identification Test (AUDIT) SF-20   BACKGROUND INFORMATION AND PRESENTING PROBLEM: [name] is a 51 year old male who resides in [place]  [name] verbally consented to, and expressed a preference for, Ms. Danny [name] (his spouse) being present for the clinical interview.   Danny [name] reported "concentration issues [he has] have been [his] biggest thing," noting he has "always" had a "problem with paying attention" but increased responsibilities, "multitasking," and sustained attention requirements at his new employment position are exacerbating his ADHD-related concerns and negatively impacting his employment and daily functioning. He further reported he has been utilizing Strattera and that it "worked okay" for approximately a month and continues to "calm [him]" but he is now on the "highest dose," "cannot even tell [he is] taking it," and his "concentration issues" have not improved. He, with his wife's corroboration and occasional providing of additional information that [name] largely agreed with, described his ADHD-related concerns as including being easily distracted by various stimuli (e.g., his phone, sounds, thoughts, and visuals); regular forgetfulness (e.g., what other's told him, where he left off on a task,  his planned driving destination, and occasional misplacing of items); difficulty listening to others even when they are speaking directly to him and despite efforts to do so, which his wife commented on and expressed frustration with; task initiation (e.g., putting off tasks despite reminders from his wife, relational strains caused by doing so, and having received comments from his coworkers that he is "always bouncing around") and maintenance (e.g., becoming distracted by other tasks and starting them prior to completing the initial task) issues; missing small details (e.g., details of what he is "supposed to do" and writing errors despite proofreading attempts) which contributes to making mistakes; trouble keeping organized which can cause clutter, with his wife adding he "just drops things;" experiencing irritability if plans are changed, someone is driving in a way he dislikes, multiple people are trying to talk to him at one time and/or feeling "pulled in multiple directions," or someone breaks his attention from a task due to the effort required to "get back" to the task; having a hard time following through with plans as he is prone to forgetting them without reminders as well as trouble adjusting to changes in plans; urges to leave meetings, although he shared use of psychotropic medication has been helpful in reducing discomfort caused by situations in which he is expected to stay seated; excessive talking, indicating he previously was socially more reserved but the calming effects of his current psychotropic medications seems to have been contributed to lessening social-related worries; his wife noting he "sporadic[ally] changes topics while he is speaking;" interrupting others as he is "thinking about other things," which can also lead to him changing the conversation subject during an interruption; trouble waiting (e.g., becoming aggravated if someone is driving slower than he would like and becoming  impatient in lines), although he expressed a belief he has become more patient with use of psychotropic medication; regularly driving [id number] over the speed limit and his wife noting she becomes distressed by him driving "too fast" and close to other cars; being prone to starting tasks without fully understanding the directions as he dislikes and has trouble comprehending them, which can lead to mistakes being made and an eventual need to read the instructions; difficulty following proper sequence of tasks as he tends  to "skip around;" and issues following through on promises and commitments he makes to others as he is prone to forgetting them.   Mr. Lazenby also described a history of depressive episodes that use of psychotropic has been helpful for, problematic alcohol use with utilization of mental health services and medication to assist with maintaining cessation, generalized worry he noted is uncontrollable at times, OCD-related symptomatology (e.g., checking behaviors, efforts to avoid germs and a desire for cleanliness, and wanting things to be a certain way), sleep onset issues that have improved with use of psychotropic medication and possible sleep apnea (e.g., snoring loudly, daytime sleepiness, and his PCP suggesting he undergo a sleep study for sleep apnea), overeating-related behaviors (e.g., eating more than usual and to the point of physical discomfort), and periods of suicidal ideation without plan or intent during periods of significant alcohol use in the past. He expressed a belief his ADHD-related concerns are consistent and independent of mood, but can become exacerbated by increased responsibilities, multitasking, "feeling rushed," and reduced sleep. He stated his coping strategies include writing things down, using a calendar and reminders, keeping items in a designated place, checking his work, utilizing his wife's support, and expressing gratitude.  Mr. Cotta denied awareness of  experiencing any developmental milestone delays or grade retention. He discussed throughout schooling he prioritized "social" aspects versus his academic performance, and he frequently did the "bare minimum" to pass a class (e.g., writing a book report based on the "back cover" of a book). He also discussed a history of reading comprehension issues that he attributed to having a hard time understanding the written concepts and regularly being distracted while reading. 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 He denied a history of employment disciplinary actions, although shared how coworkers have commented on his forgetfulness and "bouncing around" between tasks.   Mr. Stolz reported his medical history is significant for headaches and "sinus problems," adding his thyroid and A1c levels were checked and were "fine." He denied awareness of ever experiencing seizures or a head injury. He shared he previously met with "therapists" for depression that he attributed to "a lot of" alcohol use at those times as well as utilizing [facility] for problematic alcohol use. He reported his current alcohol use has significantly reduced as he can go months without using alcohol, but that he occasionally engages in binge drinking with the last instance being one month. He also reported daily use of "dip" tobacco as well as two energy drinks, one soda, and one-to-two glasses of tea. He expressed a belief caffeine "doesn't feel like it has an effect" but he is utilizing it to "[try] to stay alert." He denied ever experiencing psychiatric hospitalization or meeting full criteria for trauma- and stressor-related disorder; homicidal ideation, plan, or intent; and legal involvement. He  noted his familial mental health history is significant for his father having "depression," "controlling" behaviors, and utilizing an unspecified psychotropic medication; his paternal grandmother having anger issues; and his mother being "passive," experiencing "depression," and possibly having ADHD (e.g., demonstrated task initiation issues and was frequently "distracted").   Chart review: On 07/11/22 Dr. Danise Mina noted Mr. Rawdon has "longstanding issue [of inattention], has not seeked evaluation for this. Screens positive for ADD. Recently started on Strattera with benefit.  Discussed stimulant use, he may be interested. Will refer to psychology for formal ADHD testing. He will check if his current psychologist does this testing" and that he has a history of Bipolar II Disorder but is currently in a "stable period," a history of "alcohol abuse, having recently completed an intensive outpatient program through [facility] and uses Naltrexone as needed."   BEHAVIORAL OBSERVATIONS: Mr. Tukes presented on time for the evaluation. He was well-groomed. He was oriented to time, place, person, and purpose of the appointment. During the evaluation Mr. Golson verbalized and/or demonstrated verbal express difficulties (e.g., stating "I do not know how to put that into words"), working memory problems (e.g., "[His answer to a Digit Span task] has to be immediate. In two minutes, I can't remember the first two [digits]," "I have to write things down immediately or they are gone," and "I need to write it down to get it right. I can't see [the verbally provided arithmetic problem] in my head."), and experiencing agitation upon becoming distracted by noises his wife was making in another room and during difficulties on the Arithmetic subtest. After experiencing trouble answering various Information subtest questions, he shared how he "couldn't concentrate" in school and indicated how this led to problems learning and  retaining information he was not interested in despite a belief he had the cognitive abilities to be able to do so. Throughout the course of the evaluation, he maintained appropriate eye contact. His thought processes and content were logical, coherent, and goal-directed. There were no overt signs of a thought disorder or perceptual disturbances, nor did she report such symptomatology. There was no evidence of paraphasias (i.e., errors in speech, gross mispronunciations, and word substitutions), repetition deficits, or disturbances in volume or prosody (i.e., rhythm and intonation). Overall, based on Mr. Atienza approach to testing, the current results are believed to be a fair estimate of his abilities.  PROCEDURAL CONSIDERATIONS:  Psychological testing measures were conducted through a virtual visit with video and audio capabilities, but otherwise in a standard manner.   The Wechsler Adult Intelligence Scale, Fourth Edition (WAIS-IV) was administered via remote telepractice using digital stimulus materials on [employer]'s Q-global system. The remote testing environment appeared free of distractions, adequate rapport was established with the examinee via video/audio capabilities, and Mr. Breighner appeared appropriately engaged in the task throughout the session. No significant technological problems or distractions were noted during administration. Modifications to the standardization procedure included: none. The WAIS-IV subtests, or similar tasks, have received initial validation in several samples for remote telepractice and digital format administration, and the results are considered a valid description of Mr. Ozer skills and abilities.  CLINICAL FINDINGS:  COGNITIVE FUNCTIONING  Wechsler Adult Intelligence Scale, Fourth Edition (WAIS-IV): Mr. Nhan completed subtests of the WAIS-IV, a full-scale measure of cognitive ability. He completed subtests of the WAIS-IV, a full-scale measure of cognitive  ability. The WAIS-IV is comprised of four indices that measure cognitive processes that are components of intellectual ability; however, only subtests from the Verbal Comprehension and Working Memory indices were administered. As a result, Full-Scale-IQ (FSIQ) and General Ability Index (GAI) were unable to be determined.   WAIS-IV Scale/Subtest IQ/Scaled Score 95% Confidence Interval Percentile Rank Qualitative Description  Verbal Comprehension (VCI) 89 84-95 23 Low Average  Similarities 10     Vocabulary 8     Information 6     Working Memory (WMI) 92 86-99 30 Average  Digit Span 10     Arithmetic 7  The Verbal Comprehension Index (VCI) provides a measure of one's ability to receive, comprehend, and express language. It also measures the ability to retrieve previously learned information and to understand relationships between words and concepts presented orally. Mr. Stfort obtained a VCI scaled score of 89 (23rd percentile) placing him in the low average range compared to same-aged peers. His performance on the subtests comprising this index was diverse. Out of the three subtests, Mr. Baskette demonstrated the strongest performance on the Similarities subtest, which measured his ability to abstract meaningful concepts and relationships from verbally presented material. His lowest performance was on the Information subtest which is primarily a measure of his fund of general knowledge but may also be influenced by cultural experience, quality of education, and ability to retrieve information from long-term memory.    The Working Memory Index ([place]) provides a measure of one's ability to sustain attention, concentrate, and exert mental control. Mr. Constante obtained a WMI scaled score of 92 (30th percentile), placing him in the average range compared to same-aged peers. Mr. Butterfield demonstrated similar performance on the subtests comprising this index. His lower performance on the Arithmetic subtest may  indicate some relative specific weaknesses in arithmetic computational skills rather than problems with working memory; however, Mr. Heber noted a belief he would be able to provide correct answers to the questions if he had been able to write down the information which suggests working memory-related issues was negatively impacting his ability to solve verbally provided arithmetic problems.   ATTENTION AND PROCESSING  CNS Vital Signs: The CNS Vital Signs assessment evaluates the neurocognitive status of an individual and covers a range of mental processes. The results of the CNS Vital Signs testing indicated average neurocognitive processing ability; however, his results were deemed potentially invalid. Regarding attention, simple attention, complex attention, and sustained attention were in the average range. Executive function, working memory, and cognitive flexibility were also in the average range. Psychomotor speed, motor speed, and processing speed were average, which indicates average hand-eye coordination and thinking speed. Reaction time was low average. Visual memory (images) was average and verbal memory (words) was very low, which indicates visual memory is a relative strength and verbal memory is impaired. The results suggest Mr. Sinn experiences an impairment in verbal memory and a weakness in reaction time. Upon follow-up, Mr. Cutshaw noted having become "bored" during the Stroop test portion of the administration which may at least partially explain the demonstrated slowed response times and "possibly invalid" flag on that task. As a result, the Stroop test portion was readministered; however, Mr. Laker noted experiencing technical issues while attempting to access the measure. Given the aforementioned, re-administration results were not obtained.   Domain  Standard Score Percentile Validity Indicator Guideline  Neurocognitive Index 93 32 No Average  Composite Memory 83 13 Yes Low Average   Verbal Memory  68 2 Yes Very Low  Visual Memory 106  66 Yes Average  Psychomotor Speed 94 34 Yes Average  Reaction Time 83 13 No Low Average  Complex Attention 108 70 No Average  Cognitive Flexibility 99 47 No Average  Processing Speed  97 42 Yes Average  Executive Function 98 45 Yes Average  Working Memory 101 53 Yes Average  Sustained Attention 108 70 Yes Average  Simple Attention 107 68 Yes Average  Motor Speed 96 40 Yes Average   EXECUTIVE FUNCTION  Behavior Rating Inventory of Executive Function, Second Edition (BRIEF-A) Self-Report:  Mr. Rodda completed the Self-Report Form of the Behavior  Rating Inventory of Executive Function-Adult Version (BRIEF-A), which has three domains that evaluate cognitive, behavioral, and emotional regulation, and a Global Executive Composite score provides an overall snapshot of executive functioning. There are no missing item responses in the protocol.  The Negativity, Infrequency, and Inconsistency scales are not elevated, suggesting he did not respond to the protocol in an overly negative, haphazard, extreme, or inconsistent manner. In the context of these validity considerations, ratings of Mr. Routh everyday executive function suggest some areas of concern. The overall index, the Global Executive Composite (GEC), was elevated (GEC T = 78, %ile = >99). Both the Behavioral Regulation (BRI) and the Metacognition (MI) Indexes were elevated (BRI T = 77, %ile = >99 and MI T = 76, %ile = 97). Mr. Coluccio indicated difficulty with his ability to inhibit impulsive responses, adjust to changes in routine or task demands, monitor social behavior, initiate problem solving or activity, sustain working memory, and attend to task-oriented output. He did not describe his ability to modulate emotions, plan and organize problem-solving approaches, and organize environment and materials as problematic. Mr. Barnier elevated scores on the Inhibit scale as well as the  Behavioral Regulation and the Metacognition Indexes, suggest he is perceived as having poor inhibitory control and/or suggest more global behavioral dysregulation is having a negative effect on active metacognitive problem solving.  Scale/Index  Raw Score T Score Percentile  Inhibit 18 74 >99  Shift 16 85 >99  Emotional Control 18 61 91  Self-Monitor 14 74 99  Behavioral Regulation Index (BRI) 66 77 >99  Initiate 17 71 95  Working Memory 23 96 >99  Plan/Organize 18 63 89  Task Monitor 16 87 >99  Organization of Materials 13 54 68  Metacognition Index (MI) 87 76 97  Global Executive Composite (GEC) 153 78 >99   Validity Scale Raw Score Cumulative Percentile Protocol Classification  Negativity 2 0 - 98.3 Acceptable  Infrequency 0 0 - 97.3 Acceptable  Inconsistency 6 0 - 99.2 Acceptable   Behavior Rating Inventory of Executive Function, Second Edition (BRIEF-A) Informant: Mr. Penado spouse, Ms. Jodi Marble, completed the Informant Form of the Behavior Rating Inventory of Executive Function-Adult Version (BRIEF-A), which is equivalent to the Self-Report version and has three domains that evaluate cognitive, behavioral, and emotional regulation, and a Global Executive Composite score provides an overall snapshot of executive functioning. There are no missing item responses in the protocol.  The Negativity, Infrequency, and Inconsistency scales are not elevated, suggesting he did not respond to the protocol in an overly negative, haphazard, extreme, or inconsistent manner. In the context of these validity considerations, Ms. Kizer's ratings of Mr. Xue everyday executive function suggest some areas of concern. The overall index, the Global Executive Composite (GEC), was elevated (GEC T = 73, %ile = 97). Both the Behavioral Regulation (BRI) and the Metacognition (MI) Indexes were elevated (BRI T = 72, %ile = 95 and MI T = 72, %ile = 97). Ms. Belmontes indicated Mr. Jaquay experiences difficultly  with his ability to inhibit impulsive responses, adjust to changes in routine or task demands, monitor social behavior, initiate problem solving or activity, sustain working memory, plan and organize problem-solving approaches, and attend to task-oriented output. Ms. Antuna did not describe his ability to modulate emotions and organize environment and materials as problematic, although modulating emotions approached an abnormal elevation. The elevated scores on the Inhibit scale as well as the Behavioral Regulation and the Metacognition Indexes, suggest Mr. Panico is perceived as having poor inhibitory  control and/or suggest more global behavioral dysregulation is having a negative effect on active metacognitive problem solving.  Scale/Index  Raw Score T Score Percentile  Inhibit 19 78 99  Shift 15 73 97  Emotional Control 21 62 88  Self-Monitor 14 68 94  Behavioral Regulation Index (BRI) 69 72 95  Initiate 18 72 96  Working Memory 23 88 >99  Plan/Organize 20 66 92  Task Monitor 12 65 93  Organization of Materials 15 57 83  Metacognition Index (MI) 88 72 97  Global Executive Composite (GEC) 157 73 97   Validity Scale Raw Score Cumulative Percentile Protocol Classification  Negativity 3 0 - 98.5 Acceptable  Infrequency 0 0 - 93.3 Acceptable  Inconsistency 3 0 - 98.8 Acceptable   BEHAVIORAL FUNCTIONING   Patient Health Questionnaire-9 (PHQ-9): Mr. Helming completed the PHQ-9, a self-report measure that assesses symptoms of depression. He scored 18/27, which indicates moderately severe depression. He endorsed thoughts he would be better off dead or of hurting himself in some way during several days of the two weeks prior to his completion of the measure.   Generalized Anxiety Disorder-7 (GAD-7): Mr. Null completed the GAD-7, a self-report measure that assesses symptoms of anxiety. He scored 14/21, which indicates moderate anxiety.   Adult ADHD Self-Report Scale Symptom Checklist (ASRS): Mr.  Weakland reported the following symptoms as sometimes: difficulty wrapping up final details of a project following the completion of challenging aspects, difficulty getting things in order when a task requires organization, avoiding or delaying getting started on tasks requiring a lot of thought, fidgeting or squirming, making careless mistakes when working on boring or difficult projects, misplacing or has difficulty finding things, being distracted by noise around him, talking too much in social situations, interrupting others or finishing their sentences, difficulty waiting for turn in turn taking situations, and interrupting others when they are busy. He endorsed the following symptoms as occurring often: problems remembering appointments or obligations, struggling to sustain attention when doing boring or repetitive work, and struggling to concentrate on what people say even when they are speaking directly to him. He did not endorse any of the symptoms as very often. Endorsement of at least four items in Part A is highly consistent with ADHD in adults. The frequency scores of Part B provides additional cues. Mr. Sur scored a 3/6 on Part A and 4/12 on Part B, which is considered a positive screening for ADHD.   Adult OCD Inventory (OCD-A) SF-20: The OCD-A SF-20 was administered. Mr. Kirby scored a 195/300, which is indicative of moderate problems with OCD-related concerns. He endorsed the following as "Yes, this stops me a little or wastes a little of my time": trouble making up his mind, worry that germs are on things, move or talking in special ways to avoid bad luck, and often feeling compelled to do things he really does not want to. He endorsed the following as "Yes, this stops me from doing other things or wastes some of my time": having a special number, worrying about being clean, washing his hands over and over, getting angry if other people mess up his desk or work area, having to do things over a  certain number of times before it is just right, hating dirt or dirty things, and feeling guilty over minor infractions. He endorsed the following as "Yes, This stops me from doing a lot of things and wastes a lot of my time": spending more time than needed to check his work, arranging  things in certain ways or symmetrically, thoughts or words that repeat themselves over and over in his mind, putting things away just right, checking things several times, counting things or having numbers frequently go through his mind, and having a special number that he can count up to or doing things just that number of times.   Alcohol Use Disorders Identification Test (AUDIT) SF-20: Mr. Dewald completed the AUDIT, a screener for alcohol use disorder. He endorsed drinking alcohol monthly or less, having 5-6 drinks on a typical day when he drinks alcohol; having six or more drinks on one occasion on a monthly basis; being unable to stop drinking once he started, failing to do what was normally expected of him because of drinking, needing a first drink in the morning to get himself going after a heavy drinking session, feeling of guilt or remorse after drinking, and being unable to remember what happened the night before because he had been drinking on a less than monthly basis; and prior to the past year he or someone else having been injured as a result of his drinking and others having expressed concern about his drinking or suggesting he cut down. He scored a 14/40, which is indicative of probable hazardous or harmful alcohol consumption.   [place] Sleep Quality Index (PSQI) SF-20: The PSQI, a self-report measure that assesses sleep quality and disturbances was administered. Mr. Trompeter scored an 11/21, which indicates some sleep-related problems. He noted sleeping approximately six hours a night, not being able to go to sleep within 30 minutes less than once a week, waking up in the middle of the night or early morning  three or more times a week, having to get up to use the bathroom once or twice a week, having his pets wake him up one-to-two times a night three or more times a week, and having pain three or more times a week. He rated his sleep quality as fairly good, having used medicine to assist with sleep three or more times a week, trouble staying awake while completing various daily activities three or more times a week, and that it is a very big problem for him to keep up enough enthusiasm to get things done. His wife noted he snores three or more times a week, has long paused between breaths while asleep less than once a week, experiences legs twitching or jerking while asleep three or more times a week, and has episodes of disorientation or confusion during sleep once or twice a week.   Personality Assessment Inventory (PAI): The PAI is an objective inventory of adult personality. The validity indicators suggest Mr. Birky profile is interpretable (ICN T = 49, INF T = 51, NIM T = 66, and PIM T = 41). He is endorsing concerns with his health status (SOM-H T = 62); ruminative worry that may impair concentration and attention (ANX T = 61 and ANX-C T = 66) and may be at least partly explained by fears (ARD-P T = 65) associated with a past disturbing event(s) (ARD-T T = 70); being prone to following his personal guidelines for conduct in an inflexible and unyielding manner (ARD-O T = 68); an activity level that is somewhat higher than normal (MAN-A T = 60) as well as impulsivity and recklessness in areas with a high potential for negative consequences (BOR-S T = 72); impatience and being easily frustrated by others (MAN-I T = 62), a loosening of associations and difficulties in self-expression and communication (SCZ-T T = 90), and  entertaining ideas that others find unconventional or unusual (65 T = 60) which may at least partially contribute to relational issues and him being somewhat distant in personal relationships  ([place] T = 38 and BOR-N T = 62). He reports having close, generally supportive relationships with family and friends (NON T = 50) and appears to acknowledge the need to make some changes, has a positive attitude toward the possibility of personal change, and accepts the importance of personal responsibility (RXR T = 40).     SUMMARY AND CLINICAL IMPRESSIONS: Mr. Jovanie Verge is a 51 year old male who was referred by Dr. Ria [name] for an evaluation to determine if he currently meets criteria for a diagnosis of Attention-Deficit/Hyperactivity Disorder (ADHD).   Mr. Krist and his spouse, Ms. Jodi Marble, reported he has an extended history of ADHD-related concerns, and Mr. Franchini expressed a belief his new employment position's increased responsibilities and cognitive requirements are leading to an exacerbation of these concerns. He noted he is currently utilizing Strattera and that it has "worked okay" for approximately a month, but he is now on the "highest dose" and "cannot even tell [he is] taking it." He also discussed a history of depressive episodes that use of psychotropic medication has been helpful for, problematic alcohol use he has utilized mental health services and medication to assist with, generalized worry he described as uncontrollable at times, OCD-related symptomatology, sleep onset issues that have improved with use of psychotropic medication, possible sleep apnea, overeating-related behaviors (e.g., eating more than usual and to the point of physical discomfort), periods of suicidal ideation during times of significant alcohol use, and reading comprehension issues. He expressed a belief his ADHD-related concerns are consistent and independent of mood, but can become exacerbated by increased responsibilities, multitasking, "feeling rushed," and reduced sleep.  During the evaluation, Mr. Bellew was administered assessments to measure his current cognitive abilities. His verbal  comprehension abilities were in the low average range, and he demonstrated the strongest performance on the Similarities subtest, which measured his ability to abstract meaningful concepts and relationships from verbally presented material. His lowest performance was on the Information subtest which is primarily a measure of his fund of general knowledge but may also be influenced by cultural experience, quality of education, and ability to retrieve information from long-term memory. His ability to sustain attention, concentrate, and exert mental control was in the average range. His lower performance on the Arithmetic subtest may indicate some relative specific weaknesses in arithmetic computational skills rather than problems with working memory; however, Mr. Gorka indicated working memory-related issues were negatively impacting his ability to solve verbally provided arithmetic problems. Results of the CNS Vital Signs indicated an average neurocognitive processing ability although his results were deemed potentially invalid. He demonstrated impairment in verbal memory and a weakness in reaction time. Upon follow-up, Mr. Jeppsen noted having become "bored" during the Stroop test portion of the administration which may at least partially explain the demonstrated slowed response times and "possibly invalid" flag on that task. This evaluator attempted to re-readminister the Stroop portion, but Mr. Yo noted experiencing technical issues while attempting to access the measure so re-administration results were not obtained.   During the clinical interview and on self-report measures, Mr. Mcfarland endorsed executive functioning impairment and attentional dysregulation, hyperactivity- and impulsivity-related symptoms, and meeting full criteria for ADHD. Moreover, his spouse, Ms. Jodi Marble, indicated he is experiencing various executive functioning issues and ADHD-related concerns. While invalid test results and a  negative screening for  ADHD on the ASRS make interpretation difficult, when considering self-reported symptoms that were largely corroborated by his spouse indicated he meets full criteria for ADHD; many of his endorsed symptoms were just below the cutoff on the ASRS as well as some endorsed issues on the ASRS appear to downplay the severity of his ADHD-related concerns that he and his spouse noted during the interview (e.g., during the clinical interview he indicated task initiation problems regularly occur at home and restlessness often occurs when required to stay seated but noted they only sometimes occur on the ASRS); endorsed and/or demonstrated impairment on measures of executive functioning, reaction time, and working memory; and a potential family history of ADHD, a diagnosis of F90.2 Attention-Deficit/Hyperactivity Disorder, Combined Presentation, Moderate appears warranted. The specifier of "Moderate" was assigned as he noted his concerns cause impairment in several functioning domains (e.g., academic, occupational, social, and daily functioning).   Mr. Ishaq also endorsed a history of depressive episodes, problematic alcohol, generalized worry, OCD-related symptomatology (e.g., checking behaviors, efforts to avoid germs and a desire for cleanliness, and wanting things to be a certain way), sleep onset issues and possible sleep apnea (e.g., snoring loudly, daytime sleepiness, and his PCP suggesting he undergo a sleep study for sleep apnea), overeating-related behaviors (e.g., eating more than usual and to the point of physical discomfort), periods of suicidal ideation and increased energy during past periods of significant alcohol use, and reading comprehension difficulties. As a result, OCD-A, AUDIT, and PSQI were administered. The results on these measures indicated he is experiencing moderate OCD-related concerns, probable hazardous or harmful alcohol consumption, and various sleep issues. Chart  review information as well as information provided by Mr. and Ms. Dymek indicated he engages in binge drinking behaviors, is having trouble maintaining alcohol cessation, and has experienced various negative repercussions as a result of his use of alcohol, it appears he meets full criteria for F10.20 Alcohol Use Disorder, Moderate. While chart review indicated a history of Bipolar II Disorder, Mr. Enderson denied having experienced a past hypomanic or manic episode, although shared that alcohol use can increase his energy. Given the aforementioned, it does not appear that he currently meets criteria for a Bipolar and Related Disorder and his hypomanic-related symptoms may be substance induced. Given the limited scope of this evaluation, it was unable to be determined if full criteria for an anxiety disorder, depressive disorder, sleep-wake disorder, OCD, or an eating disorder are met or if the symptoms are better explained by his diagnosis of ADHD and Alcohol Use Disorder. He would likely benefit from further evaluation of these symptoms to definitively rule in or out an anxiety disorder, depressive disorder, eating disorder, OCD, and reading disorder. Should any of the aforementioned be ruled in, they would likely be in addition to his diagnoses of ADHD and Alcohol Use Disorder as he indicated his ADHD-related concerns are independent of mood and reading difficulties. Similarly, his diagnosis of Alcohol Use Disorder does not appear to better explain his endorsed ADHD-related symptomatology as he noted his ADHD-related concerns were occurring prior to alcohol use and occur outside of periods in which he is engaging in problematic use of alcohol.   DSM-5 Diagnostic Impressions: F90.2 Attention-Deficit/Hyperactivity Disorder, Combined Presentation, Moderate F10.20 Alcohol Use Disorder, Moderate  RECOMMENDATIONS: Mr. Cherne would likely benefit from making use of strategies for ADHD symptoms:  Setting a timer to  complete tasks. Breaking tasks into manageable chunks and spreading them out over longer periods of time with breaks.  Utilizing lists and day  calendars to keep track of tasks.  Answering emails daily.  Improving listening skills by asking the speaker to give information in smaller chunks and asking for explanation for clarification as needed. Leaving more than the anticipated time to complete tasks. It may help to keep tasks brief, well within your attention span, and a mix of both high and low interest tasks. Tasks may be gradually increased in length. Practice proactive planning by setting aside time every evening to plan for the next day (e.g., prepare needed materials or pack the car the night before).  Learn how to make an effective and reasonable "to do" list of important tasks and priorities and always keep it easily accessible. Make additional copies in case it is lost or misplaced. Utilize visual reminders by posting appointments, "to do lists," or schedule in strategic areas at home and at work.  Practice using an appointment book, smart phone or other tech device, or a daily planning calendar, and learn to write down appointments and commitments immediately. Keep notepads or use a portable audio recorder to capture important ideas that would be beneficial to recall later. Learn and practice time management skills. Purchase a programmable alarm watch or set an alarm on smartphone to avoid losing track of time.  Use a color-coded file system, desk and closet organizers, storage boxes, or other organization device to reduce clutter and improve efficiency and structure.  Implement ways to become more aware of your actions and to inhibit or adjust them as warranted (e.g., reviewing videos of your actions, consider consequences of obeying or not obeying the rules of various upcoming situations, have a trusted other to discuss plans with and/or provide cues to stop certain behaviors, and make  visual cues for rules you would like to follow). Stay flexible and be prepared to change your plans as symptom breakthroughs and crises are likely to occur periodically. Mr. Campton may benefit from mindfulness training to address symptoms of inattention.  Mr. Bilodeau would likely benefit from a consultation regarding medication for ADHD symptoms.   Individual therapeutic services may assist in processing a diagnosis of ADHD and discussing coping and compensatory strategies. Mental alertness/energy can be raised by increasing exercise; improving sleep; eating a healthy diet; and managing alcohol use and stress. Consulting with a physician regarding any changes to physical regimen is recommended. "Failing at Normal: An ADHD Success Story" by Mellody Drown is a great overview of ADHD.  Organizations that are a good source of information on ADHD:  Children and Adults with Attention-Deficit/Hyperactivity Disorder (CHADD): chadd.org  Attention Deficit Disorder Association (ADDA) [employer].com.cy ADD Resources: addresources.org ADD WareHouse: addwarehouse.com World Federation of ADHD: adhd-federation.org ADDConsults: addconsults.com Future evaluation if deemed necessary and/or to determine effectiveness of recommended interventions.   Clarice Pole, Psy.D. Licensed Psychologist - [facility] #[id number]              Dolores Lory, PsyD

## 2022-11-15 ENCOUNTER — Telehealth: Payer: Self-pay | Admitting: Family Medicine

## 2022-11-15 DIAGNOSIS — F1020 Alcohol dependence, uncomplicated: Secondary | ICD-10-CM

## 2022-11-15 DIAGNOSIS — F902 Attention-deficit hyperactivity disorder, combined type: Secondary | ICD-10-CM

## 2022-11-15 DIAGNOSIS — F3181 Bipolar II disorder: Secondary | ICD-10-CM

## 2022-11-15 NOTE — Telephone Encounter (Signed)
Patient called in and wanted to know if his paperwork was received from Dr. Dewaine Conger. Please advise. Thank you!

## 2022-11-16 ENCOUNTER — Encounter: Payer: Self-pay | Admitting: Family Medicine

## 2022-11-16 NOTE — Telephone Encounter (Signed)
I have not seen any ppw/notes from Dr. Excell Seltzer concerning pt.  Lvm asking pt to call back. Need more details as to what type of ppw we should be looking for.

## 2022-11-17 NOTE — Telephone Encounter (Signed)
OV notes from Dr. Dewaine Conger have been scanned into pt's chart. Fyi to Dr. Reece Agar.

## 2022-11-17 NOTE — Telephone Encounter (Signed)
Looks like pt called back and scheduled OV on 11/28/22 at 11:00.

## 2022-11-17 NOTE — Telephone Encounter (Signed)
Lvm asking pt to call back. Need to relay Dr. Timoteo Expose message and schedule OV.

## 2022-11-17 NOTE — Telephone Encounter (Signed)
Notes reviewed. Did test positive for ADHD.  Please schedule OV to discuss possible stimulant Rx.

## 2022-11-17 NOTE — Telephone Encounter (Signed)
Noted. (See 11/15/22 phn note.)

## 2022-11-17 NOTE — Telephone Encounter (Signed)
Pt called in schedule appointment to F/U with PCP

## 2022-11-28 ENCOUNTER — Ambulatory Visit: Payer: 59 | Admitting: Family Medicine

## 2022-11-30 ENCOUNTER — Encounter: Payer: Self-pay | Admitting: Family Medicine

## 2022-11-30 ENCOUNTER — Ambulatory Visit: Payer: 59 | Admitting: Family Medicine

## 2022-11-30 VITALS — BP 132/74 | HR 88 | Temp 97.3°F | Ht 69.5 in | Wt 246.2 lb

## 2022-11-30 DIAGNOSIS — F3181 Bipolar II disorder: Secondary | ICD-10-CM | POA: Diagnosis not present

## 2022-11-30 DIAGNOSIS — F1020 Alcohol dependence, uncomplicated: Secondary | ICD-10-CM | POA: Diagnosis not present

## 2022-11-30 DIAGNOSIS — F902 Attention-deficit hyperactivity disorder, combined type: Secondary | ICD-10-CM | POA: Diagnosis not present

## 2022-11-30 MED ORDER — LISDEXAMFETAMINE DIMESYLATE 30 MG PO CAPS
30.0000 mg | ORAL_CAPSULE | Freq: Every day | ORAL | 0 refills | Status: DC
Start: 2022-11-30 — End: 2022-12-19

## 2022-11-30 NOTE — Assessment & Plan Note (Addendum)
Likely does not carry this diagnosis per recent psychological eval, but rather alcohol-induced mood disorder as per below - will stop depakote, continue celexa, other meds as per below.

## 2022-11-30 NOTE — Progress Notes (Signed)
Patient ID: Danny [name], male    DOB: 1971/05/16, 52 y.o.   MRN: [id number]  This visit was conducted in person.  BP 132/74   Pulse 88   Temp (!) 97.3 F (36.3 C) (Temporal)   Ht 5' 9.5" (1.765 m)   Wt 246 lb 4 oz (111.7 kg)   SpO2 96%   BMI 35.84 kg/m    CC: discuss ADHD  Subjective:   HPI: Danny [name] is a 52 y.o. male presenting on 11/30/2022 for ADHD (Wants to discuss starting meds. )   Recent psychological eval by Dr Clarice Pole - tested positive for combined ADHD. Per evaluation, no true mania or hypomania, thought possibly alcohol-induced mood disorder in place of bipolar 2.  Most recently completed outpatient rehab program through [facility] 06/2022. He also uses naltrexone PRN.   Most recently Strattera was helpful however lost effect.   Denies headache, chest pain, insomnia, anorexia, tics or mood changes.      Relevant past medical, surgical, family and social history reviewed and updated as indicated. Interim medical history since our last visit reviewed. Allergies and medications reviewed and updated. Outpatient Medications Prior to Visit  Medication Sig Dispense Refill   citalopram (CELEXA) 20 MG tablet Take 1 tablet (20 mg total) by mouth daily. 90 tablet 3   fluticasone (FLONASE) 50 MCG/ACT nasal spray Place 2 sprays into both nostrils daily. (Patient taking differently: Place 2 sprays into both nostrils daily. As needed) 16 g 6   naltrexone (DEPADE) 50 MG tablet Take 1 tablet (50 mg total) by mouth daily. 90 tablet 3   divalproex (DEPAKOTE ER) 500 MG 24 hr tablet Take 1 tablet (500 mg total) by mouth at bedtime. 90 tablet 3   ACETAMINOPHEN PO Take 1 tablet by mouth every 8 (eight) hours as needed.     atomoxetine (STRATTERA) 40 MG capsule Take 1 capsule (40 mg total) by mouth daily. 90 capsule 3   No facility-administered medications prior to visit.     Per HPI unless specifically indicated in ROS section below Review of Systems  Objective:   BP 132/74   Pulse 88   Temp (!) 97.3 F (36.3 C) (Temporal)   Ht 5' 9.5" (1.765 m)   Wt 246 lb 4 oz (111.7 kg)   SpO2 96%   BMI 35.84 kg/m   Wt Readings from Last 3 Encounters:  11/30/22 246 lb 4 oz (111.7 kg)  07/11/22 240 lb 8 oz (109.1 kg)  01/12/22 235 lb (106.6 kg)      Physical Exam Vitals and nursing note reviewed.  Constitutional:      Appearance: Normal appearance. He is not ill-appearing.  HENT:     Head: Normocephalic and atraumatic.  Cardiovascular:     Rate and Rhythm: Normal rate and regular rhythm.     Pulses: Normal pulses.     Heart sounds: Normal heart sounds. No murmur heard. Pulmonary:     Effort: Pulmonary effort is normal. No respiratory distress.     Breath sounds: Normal breath sounds. No wheezing, rhonchi or rales.  Musculoskeletal:     Right lower leg: No edema.     Left lower leg: No edema.  Skin:    General: Skin is warm and dry.     Findings: No rash.  Neurological:     Mental Status: He is alert.  Psychiatric:        Mood and Affect: Mood normal.  Behavior: Behavior normal.       Results for orders placed or performed in visit on 07/11/22  TSH  Result Value Ref Range   TSH 2.23 0.35 - 5.50 uIU/mL   Assessment & Plan:   Attention deficit hyperactivity disorder (ADHD), combined type, moderate Assessment & Plan: Reviewed latest psychological testing by Dr Mallie Mussel 10/2022 consistent with combined moderate ADHD.  Reviewed stimulant vs nonstimulant treatment. Strattera lost effect even at high dose.  Interested in stimulant treatment - will start Vyvanse 30mg  daily - which seems to be preferred by his insurance. Reviewed stimulant mechanism of action, need to monitor for HA, CP, mood changes, insomnia, anorexia.  Discussed pros and cons of controlled substances and expectations to receive prescription from our office. Patient is not to abuse, misuse, divert or use medication other than as prescribed. Patient is not to seek  controlled substances from other clinics or multiple pharmacies. Patient is not to use illegal drugs. Discussed risks of medication including dependence, tolerance, and addiction/abuse potential. Patient will establish or update controlled substance agreement and complete urine drug screen.  RTC 4 wks f/u visit.   Orders: -     DRUG MONITORING, PANEL 8 WITH CONFIRMATION, URINE  Bipolar 2 disorder ([place]) Assessment & Plan: Likely does not carry this diagnosis per recent psychological eval, but rather alcohol-induced mood disorder as per below - will stop depakote, continue celexa, other meds as per below.    Moderate alcohol use disorder (HCC) Assessment & Plan: Remains abstinent with PRN naltrexone use.   Other orders -     Lisdexamfetamine Dimesylate; Take 1 capsule (30 mg total) by mouth daily.  Dispense: 30 capsule; Refill: 0    Patient Instructions  Trial off depakote, continue celexa, let us know if any worsening mood, racing thoughts, high energy issues.  Start Vyvanse 30mg  daily sent to pharmacy.  Update [place] with effect, let me know if any trouble tolerating.  Controlled substance agreement form today for Vyvanse.  ADHD handout provided today.  Return in 4-6 weeks for follow up visit   Follow up plan: Return in about 1 month (around 12/31/2022), or if symptoms worsen or fail to improve, for follow up visit.  Ria Bush, MD

## 2022-11-30 NOTE — Assessment & Plan Note (Signed)
Remains abstinent with PRN naltrexone use.

## 2022-11-30 NOTE — Assessment & Plan Note (Signed)
Reviewed latest psychological testing by Dr Mallie Mussel 10/2022 consistent with combined moderate ADHD.  Reviewed stimulant vs nonstimulant treatment. Strattera lost effect even at high dose.  Interested in stimulant treatment - will start Vyvanse 30mg  daily - which seems to be preferred by his insurance. Reviewed stimulant mechanism of action, need to monitor for HA, CP, mood changes, insomnia, anorexia.  Discussed pros and cons of controlled substances and expectations to receive prescription from our office. Patient is not to abuse, misuse, divert or use medication other than as prescribed. Patient is not to seek controlled substances from other clinics or multiple pharmacies. Patient is not to use illegal drugs. Discussed risks of medication including dependence, tolerance, and addiction/abuse potential. Patient will establish or update controlled substance agreement and complete urine drug screen.  RTC 4 wks f/u visit.

## 2022-11-30 NOTE — Patient Instructions (Addendum)
Trial off depakote, continue celexa, let us know if any worsening mood, racing thoughts, high energy issues.  Start Vyvanse 30mg  daily sent to pharmacy.  Update [place] with effect, let me know if any trouble tolerating.  Controlled substance agreement form today for Vyvanse.  ADHD handout provided today.  Return in 4-6 weeks for follow up visit

## 2022-12-01 LAB — DRUG MONITORING, PANEL 8 WITH CONFIRMATION, URINE
6 Acetylmorphine: NEGATIVE ng/mL (ref <10)
Alcohol Metabolites: NEGATIVE ng/mL (ref <500)
Amphetamines: NEGATIVE ng/mL (ref <500)
Benzodiazepines: NEGATIVE ng/mL (ref <100)
Buprenorphine, Urine: NEGATIVE ng/mL (ref <5)
Cocaine Metabolite: NEGATIVE ng/mL (ref <150)
Creatinine: 100.6 mg/dL (ref >=20.0)
MDMA: NEGATIVE ng/mL (ref <500)
Marijuana Metabolite: NEGATIVE ng/mL (ref <20)
Opiates: NEGATIVE ng/mL (ref <100)
Oxidant: NEGATIVE ug/mL (ref <200)
Oxycodone: NEGATIVE ng/mL (ref <100)
pH: 6.7 (ref 4.5–9.0)

## 2022-12-01 LAB — DM TEMPLATE

## 2022-12-19 ENCOUNTER — Encounter: Payer: Self-pay | Admitting: Family Medicine

## 2022-12-19 ENCOUNTER — Ambulatory Visit: Payer: 59 | Admitting: Family Medicine

## 2022-12-19 VITALS — BP 132/84 | HR 78 | Temp 97.4°F | Ht 69.5 in | Wt 242.4 lb

## 2022-12-19 DIAGNOSIS — F3181 Bipolar II disorder: Secondary | ICD-10-CM

## 2022-12-19 DIAGNOSIS — F1020 Alcohol dependence, uncomplicated: Secondary | ICD-10-CM | POA: Diagnosis not present

## 2022-12-19 DIAGNOSIS — F902 Attention-deficit hyperactivity disorder, combined type: Secondary | ICD-10-CM

## 2022-12-19 DIAGNOSIS — J019 Acute sinusitis, unspecified: Secondary | ICD-10-CM | POA: Diagnosis not present

## 2022-12-19 MED ORDER — LISDEXAMFETAMINE DIMESYLATE 40 MG PO CAPS: 40.0000 mg | ORAL_CAPSULE | Freq: Every day | ORAL | 0 refills | Status: DC

## 2022-12-19 MED ORDER — FLUTICASONE PROPIONATE 50 MCG/ACT NA SUSP
2.0000 | Freq: Every day | NASAL | 3 refills | Status: DC
Start: 2022-12-19 — End: 2023-03-21

## 2022-12-19 NOTE — Assessment & Plan Note (Signed)
Doing well without recent need for naltrexone.

## 2022-12-19 NOTE — Assessment & Plan Note (Addendum)
Doing well since starting Vyvanse almost 3 weeks ago. Tolerating medication well without untoward side effects. Desires to try higher dose given effect is wearing off after about 7 hours. Will send Vyvanse 40mg  for next refill, I asked him to notify me 3 wks into new dose for effect/determine need for further titration. RTC 3 mo ADHD f/u visit.  Significant improvement noted in PHQ9 (16 --> 6) and GAD7 (16 --> 0).

## 2022-12-19 NOTE — Assessment & Plan Note (Deleted)
Continue SSRI at this time.

## 2022-12-19 NOTE — Assessment & Plan Note (Signed)
Anticipate viral given short duration. Supportive measures reviewed. Add flonase. Update in 2 days with effect, if ongoing low threshold to send in abx course for him (likely augmentin)

## 2022-12-19 NOTE — Progress Notes (Signed)
Patient ID: Danny [name], male    DOB: February 13, 1971, 52 y.o.   MRN: [id number]  This visit was conducted in person.  BP 132/84   Pulse 78   Temp (!) 97.4 F (36.3 C) (Temporal)   Ht 5' 9.5" (1.765 m)   Wt 242 lb 6 oz (109.9 kg)   SpO2 98%   BMI 35.28 kg/m    CC: 4 wk ADHD f/u, sinus symptoms  Subjective:   HPI: Danny [name] is a 52 y.o. male presenting on 12/19/2022 for Medical Management of Chronic Issues (Here for 4 wk ADHD f/u. Also, c/o sinus pain, ear pain, ST and prod cough. Sxs started 12/14/22. Tried Nyquil, Mucinex and TheraFlu. Sxs have improved but still hanging on. )   Recent ADHD diagnosis through psychology, last visit we started treatment with Vyvanse 30mg  daily - tolerating well without headache, chest pain, insomnia, anorexia, tics or mood changes.   We also stopped depakote.  He continues celexa 20mg  daily. He stopped naltrexone 50mg  several weeks ago without any issue.  Strattera previously tried, lost effect.   Energy levels have improved since off depakote.   6d h/o sinus congestion and pressure headache, bilateral ear discomfort, ST and productive cough. Symptoms some better. Has treated with nyquil, mucinex, theraflu. No fevers/chills. No sick contacts at home.      Relevant past medical, surgical, family and social history reviewed and updated as indicated. Interim medical history since our last visit reviewed. Allergies and medications reviewed and updated. Outpatient Medications Prior to Visit  Medication Sig Dispense Refill   citalopram (CELEXA) 20 MG tablet Take 1 tablet (20 mg total) by mouth daily. 90 tablet 3   fluticasone (FLONASE) 50 MCG/ACT nasal spray Place 2 sprays into both nostrils daily. (Patient taking differently: Place 2 sprays into both nostrils daily. As needed) 16 g 6   lisdexamfetamine (VYVANSE) 30 MG capsule Take 1 capsule (30 mg total) by mouth daily. 30 capsule 0   naltrexone (DEPADE) 50 MG tablet Take 1 tablet (50 mg total)  by mouth daily. (Patient not taking: Reported on 12/19/2022) 90 tablet 3   No facility-administered medications prior to visit.     Per HPI unless specifically indicated in ROS section below Review of Systems  Objective:  BP 132/84   Pulse 78   Temp (!) 97.4 F (36.3 C) (Temporal)   Ht 5' 9.5" (1.765 m)   Wt 242 lb 6 oz (109.9 kg)   SpO2 98%   BMI 35.28 kg/m   Wt Readings from Last 3 Encounters:  12/19/22 242 lb 6 oz (109.9 kg)  11/30/22 246 lb 4 oz (111.7 kg)  07/11/22 240 lb 8 oz (109.1 kg)      Physical Exam Vitals and nursing note reviewed.  Constitutional:      Appearance: Normal appearance. He is not ill-appearing.  HENT:     Head: Normocephalic and atraumatic.     Right Ear: Hearing, tympanic membrane, ear canal and external ear normal. There is no impacted cerumen.     Left Ear: Hearing, tympanic membrane, ear canal and external ear normal. There is no impacted cerumen.     Nose: Mucosal edema and congestion present. No rhinorrhea.     Right Turbinates: Not enlarged or swollen.     Left Turbinates: Not enlarged or swollen.     Right Sinus: No maxillary sinus tenderness or frontal sinus tenderness.     Left Sinus: No maxillary sinus tenderness or frontal sinus tenderness.  Mouth/Throat:     Mouth: Mucous membranes are moist.     Pharynx: Oropharynx is clear. No oropharyngeal exudate or posterior oropharyngeal erythema.  Eyes:     Extraocular Movements: Extraocular movements intact.     Conjunctiva/sclera: Conjunctivae normal.     Pupils: Pupils are equal, round, and reactive to light.  Cardiovascular:     Rate and Rhythm: Normal rate and regular rhythm.     Pulses: Normal pulses.     Heart sounds: Normal heart sounds. No murmur heard. Pulmonary:     Effort: Pulmonary effort is normal. No respiratory distress.     Breath sounds: Normal breath sounds. No wheezing, rhonchi or rales.  Musculoskeletal:     Cervical back: Normal range of motion and neck supple.  No rigidity.     Right lower leg: No edema.     Left lower leg: No edema.  Lymphadenopathy:     Cervical: No cervical adenopathy.  Skin:    General: Skin is warm and dry.     Findings: No rash.  Neurological:     Mental Status: He is alert.  Psychiatric:        Mood and Affect: Mood normal.        Behavior: Behavior normal.       Results for orders placed or performed in visit on 11/30/22  DRUG MONITORING, PANEL 8 WITH CONFIRMATION, URINE  Result Value Ref Range   Alcohol Metabolites NEGATIVE <500 ng/mL   Amphetamines NEGATIVE <500 ng/mL   Benzodiazepines NEGATIVE <100 ng/mL   Buprenorphine, Urine NEGATIVE <5 ng/mL   Cocaine Metabolite NEGATIVE <150 ng/mL   6 Acetylmorphine NEGATIVE <10 ng/mL   Marijuana Metabolite NEGATIVE <20 ng/mL   MDMA NEGATIVE <500 ng/mL   Opiates NEGATIVE <100 ng/mL   Oxycodone NEGATIVE <100 ng/mL   Creatinine 100.6 > or = 20.0 mg/dL   pH 6.7 4.5 - 9.0   Oxidant NEGATIVE <200 mcg/mL  DM TEMPLATE  Result Value Ref Range   Notes and Comments        12/19/2022   11:41 AM 11/30/2022    4:20 PM 07/11/2022    9:59 AM 06/11/2021   12:19 PM 05/19/2020    3:53 PM  Depression screen PHQ 2/9  Decreased Interest 1 1 1 1  0  Down, Depressed, Hopeless 1 1 1 1  0  PHQ - 2 Score 2 2 2 2  0  Altered sleeping 0 2 0 0 1  Tired, decreased energy 1 3 3 2 3   Change in appetite 1 2 1 1  0  Feeling bad or failure about yourself  0 1 1 1  0  Trouble concentrating 1 3 3 2 3   Moving slowly or fidgety/restless 1 3 0 0 0  Suicidal thoughts 0 0 0 0 0  PHQ-9 Score 6 16 10 8 7   Difficult doing work/chores Not difficult at all  Very difficult         12/19/2022   11:41 AM 11/30/2022    4:20 PM 07/11/2022    9:59 AM 06/11/2021   12:19 PM  GAD 7 : Generalized Anxiety Score  Nervous, Anxious, on Edge 0 2 1 1   Control/stop worrying 0 2 1 1   Worry too much - different things 0 3 1 1   Trouble relaxing 0 3 1 2   Restless 0 3 0 1  Easily annoyed or irritable 0 2 0 1  Afraid -  awful might happen 0 1 0 1  Total GAD 7 Score 0 16 4  8  Anxiety Difficulty  Very difficult Somewhat difficult    Assessment & Plan:   Problem List Items Addressed This Visit     Bipolar 2 disorder ([place])    Anticipate does not have this diagnosis. Has done well stopping depakote without mood changes. He continues celexa. Discussed possible taper off celexa in future if continues doing well.       Moderate alcohol use disorder (HCC)    Doing well without recent need for naltrexone.       Acute sinusitis    Anticipate viral given short duration. Supportive measures reviewed. Add flonase. Update in 2 days with effect, if ongoing low threshold to send in abx course for him (likely augmentin)      Relevant Medications   fluticasone (FLONASE) 50 MCG/ACT nasal spray   Attention deficit hyperactivity disorder (ADHD), combined type, moderate - Primary    Doing well since starting Vyvanse almost 3 weeks ago. Tolerating medication well without untoward side effects. Desires to try higher dose given effect is wearing off after about 7 hours. Will send Vyvanse 40mg  for next refill, I asked him to notify me 3 wks into new dose for effect/determine need for further titration. RTC 3 mo ADHD f/u visit.  Significant improvement noted in PHQ9 (16 --> 6) and GAD7 (16 --> 0).         Meds ordered this encounter  Medications   DISCONTD: lisdexamfetamine (VYVANSE) 40 MG capsule    Sig: Take 1 capsule (40 mg total) by mouth daily.    Dispense:  30 capsule    Refill:  0   fluticasone (FLONASE) 50 MCG/ACT nasal spray    Sig: Place 2 sprays into both nostrils daily.    Dispense:  16 g    Refill:  3   lisdexamfetamine (VYVANSE) 40 MG capsule    Sig: Take 1 capsule (40 mg total) by mouth daily.    Dispense:  30 capsule    Refill:  0    No orders of the defined types were placed in this encounter.   Patient Instructions  You are doing well. Increase vyvanse to 40mg  daily for next refill,  monitor for side effects.  Call me or send me a message 3 weeks in with effect.  Return in 3 months for follow up visit  You have a sinus infection, likely viral at this time. Take medicine as prescribed: restart flonase nasal steroid Push fluids and plenty of rest. Nasal saline irrigation or neti pot to help drain sinuses. Limit afrin nasal decongestant to no more than 1-2 days at a time May use plain mucinex with plenty of fluid to help mobilize mucous. Please let us know if fever >101.5, trouble opening/closing mouth, difficulty swallowing, or worsening instead of improving as expected.  Let me know if ongoing symptoms past 8-10 days and I will send antibiotic for you.   Follow up plan: Return in about 3 months (around 03/20/2023) for follow up visit.  Ria Bush, MD

## 2022-12-19 NOTE — Assessment & Plan Note (Addendum)
Anticipate does not have this diagnosis. Has done well stopping depakote without mood changes. He continues celexa. Discussed possible taper off celexa in future if continues doing well.

## 2022-12-19 NOTE — Patient Instructions (Signed)
You are doing well. Increase vyvanse to 40mg  daily for next refill, monitor for side effects.  Call me or send me a message 3 weeks in with effect.  Return in 3 months for follow up visit  You have a sinus infection, likely viral at this time. Take medicine as prescribed: restart flonase nasal steroid Push fluids and plenty of rest. Nasal saline irrigation or neti pot to help drain sinuses. Limit afrin nasal decongestant to no more than 1-2 days at a time May use plain mucinex with plenty of fluid to help mobilize mucous. Please let us know if fever >101.5, trouble opening/closing mouth, difficulty swallowing, or worsening instead of improving as expected.  Let me know if ongoing symptoms past 8-10 days and I will send antibiotic for you.

## 2022-12-20 ENCOUNTER — Telehealth: Payer: Self-pay | Admitting: Family Medicine

## 2022-12-20 MED ORDER — AMOXICILLIN-POT CLAVULANATE 875-125 MG PO TABS: 1.0000 | ORAL_TABLET | Freq: Two times a day (BID) | ORAL | 0 refills | Status: AC

## 2022-12-20 NOTE — Telephone Encounter (Signed)
Plz notify I've sent in abx to pharmacy. Do want him to wait another 2 days to see if symptoms improve on their own prior to starting abx.

## 2022-12-20 NOTE — Telephone Encounter (Signed)
Patient called and stated he was told to call back if his symptoms are not getting better from his appointment yesterday he was told to call back and get a antibiotic. Call back number [phone].

## 2022-12-20 NOTE — Telephone Encounter (Signed)
Patient notified as instructed by telephone and verbalized understanding. 

## 2023-01-15 ENCOUNTER — Encounter: Payer: Self-pay | Admitting: Family Medicine

## 2023-01-18 ENCOUNTER — Other Ambulatory Visit: Payer: Self-pay | Admitting: Family Medicine

## 2023-01-18 DIAGNOSIS — F902 Attention-deficit hyperactivity disorder, combined type: Secondary | ICD-10-CM

## 2023-01-19 NOTE — Telephone Encounter (Signed)
Name of Medication: Vyvanse Name of Pharmacy: [facility] or Written Date and Quantity: 12/30/22, #30 Last Office Visit and Type: 12/19/22, #30 Next Office Visit and Type: 07/14/23, CPE Last Controlled Substance Agreement Date: 11/30/22 Last UDS: 11/30/22

## 2023-01-20 MED ORDER — LISDEXAMFETAMINE DIMESYLATE 40 MG PO CAPS: 40.0000 mg | ORAL_CAPSULE | Freq: Every day | ORAL | 0 refills | Status: DC

## 2023-01-20 NOTE — Telephone Encounter (Signed)
Patient called in and stated that [facility] doesn't have the medication. He stated that it is on back order. He would like for it to go to [facility] ([facility] [place] [facility] [address] . He stated they have some in stock. Please advise. Thank you!

## 2023-01-20 NOTE — Addendum Note (Signed)
Addended by: Ria Bush on: 01/20/2023 04:51 PM   Modules accepted: Orders

## 2023-01-20 NOTE — Telephone Encounter (Signed)
Rx was faxed to [facility] earlier today. (See 01/18/23 refill note.)

## 2023-01-20 NOTE — Telephone Encounter (Signed)
Printed and in [employer].

## 2023-01-20 NOTE — Telephone Encounter (Signed)
ERx to new pharmacy requested.

## 2023-01-20 NOTE — Telephone Encounter (Signed)
Faxed printed rx to [facility].

## 2023-01-20 NOTE — Telephone Encounter (Signed)
Pt's wife called asking for Dr. Darnell Level to send the meds, lisdexamfetamine (VYVANSE) 40 MG capsule [id number] to  dr, Lady Gary? Pt's wife stated thir preferred pharmacy, doesn't have the meds in stock. Call back # [phone]

## 2023-01-20 NOTE — Telephone Encounter (Signed)
ERx to [facility] per pt request - see mychart

## 2023-01-23 ENCOUNTER — Encounter: Payer: Self-pay | Admitting: Family Medicine

## 2023-01-23 ENCOUNTER — Telehealth: Payer: Self-pay | Admitting: Family Medicine

## 2023-01-23 DIAGNOSIS — F902 Attention-deficit hyperactivity disorder, combined type: Secondary | ICD-10-CM

## 2023-01-23 MED ORDER — LISDEXAMFETAMINE DIMESYLATE 40 MG PO CAPS: 40.0000 mg | ORAL_CAPSULE | Freq: Every day | ORAL | 0 refills | Status: DC

## 2023-01-23 NOTE — Telephone Encounter (Signed)
Printed and in [employer].

## 2023-01-23 NOTE — Telephone Encounter (Signed)
Pt's wife called asking if Dr. Darnell Level could write a prescription for the meds, lisdexamfetamine (VYVANSE) 40 MG capsule, for the pt to pick up? Pt's wife states many pharmacies don't have the meds in stock & if the pt has to wait for Dr. Darnell Level to send prescriptions to different pharmacies, it'll prolong the pt from receiving meds. Call back # [phone]

## 2023-01-23 NOTE — Telephone Encounter (Signed)
See access nurse note. Script needs to go to [facility]

## 2023-01-23 NOTE — Telephone Encounter (Signed)
Spoke with pt's wife, Lynelle Smoke (on dpr), notifying her pt's printed rx is ready to pick up. Expresses her thanks and will inform pt.   [Placed rx at front office.]

## 2023-01-23 NOTE — Telephone Encounter (Addendum)
Sending to [facility].  [facility] Night - Client TELEPHONE ADVICE RECORD AccessNurse Patient Name: Danny [name] [place] Gender: Male DOB: 1971/07/10 Age: 52 Y 12 M 2 D Return Phone Number: [phone] (Primary) Address: City/ State/ Zip: [place] [address] Client [facility] Night - Client Client Site [facility] Provider Ria Bush - MD Contact Type Call Who Is Calling Patient / Member / Family / Caregiver Call Type Triage / Clinical Caller Name Armond Mussett Relationship To Patient Spouse Return Phone Number [phone] (Primary) Chief Complaint Prescription Refill or Medication Request (non symptomatic) Reason for Call Medication Question / Request Initial Comment Caller states the doctor called in a prescription to 2 pharmacies and both are now out. Caller states she needs the prescription to be sent again to the [facility] in [place], [place]. Caller states her husband is not experiencing any symptoms at the moment. Translation No Nurse Assessment Nurse: Louie Boston, RN, Barnetta Chapel Date/Time (Eastern Time): 01/21/2023 2:57:06 PM Confirm and document reason for call. If symptomatic, describe symptoms. ---caller states he tried to get medication but been on back order. Does the patient have any new or worsening symptoms? ---No Please document clinical information provided and list any resource used. ---call office monday Nurse: Louie Boston, RN, Barnetta Chapel Date/Time Eilene Ghazi Time): 01/21/2023 2:58:04 PM Please select the assessment type ---Request for controlled medication refill Additional Documentation ---vyvanse '40mg'$  QD Is there an on-call physician for the client? ---Yes Do the client directives specifically allow for paging the on-call regarding scheduled drugs? ---No Additional Documentation ---[facility] [place] has it now. last pharmacy ran out before able to fill it Disp. Time  Eilene Ghazi Time) Disposition Final User 01/21/2023 2:51:43 PM Send to Maguayo, RN, Barnetta Chapel 01/21/2023 3:00:17 PM Clinical Call Yes Louie Boston, RN, Mamie Nick NOTE: All timestamps contained within this report are represented as [place] Standard Time. CONFIDENTIALTY NOTICE: This fax transmission is intended only for the addressee. It contains information that is legally privileged, confidential or otherwise protected from use or disclosure. If you are not the intended recipient, you are strictly prohibited from reviewing, disclosing, copying using or disseminating any of this information or taking any action in reliance on or regarding this information. If you have received this fax in error, please notify us immediately by telephone so that we can arrange for its return to [place]. Phone: [phone], Toll-Free: [phone], Fax: [phone] Page: 2 of 2 Call Id: [id number] Final Disposition 01/21/2023 3:00:17 PM Clinical Call Yes Louie Boston, RN, Catherin

## 2023-01-25 NOTE — Telephone Encounter (Addendum)
Previously addressed - Rx printed for wife to pick up.

## 2023-01-26 MED ORDER — DIVALPROEX SODIUM ER 500 MG PO TB24: 500.0000 mg | ORAL_TABLET | Freq: Every day | ORAL | 6 refills | Status: DC

## 2023-01-26 NOTE — Telephone Encounter (Signed)
Pt called to check on status of mychart message sent on 3/5?

## 2023-01-26 NOTE — Telephone Encounter (Signed)
Noted  

## 2023-01-26 NOTE — Telephone Encounter (Signed)
ERx Depakote ER '500mg'$  nightly sent to [facility]. Let me know if needs to be sent elsewhere.

## 2023-02-12 ENCOUNTER — Other Ambulatory Visit: Payer: Self-pay | Admitting: Family Medicine

## 2023-02-12 DIAGNOSIS — F902 Attention-deficit hyperactivity disorder, combined type: Secondary | ICD-10-CM

## 2023-02-13 NOTE — Telephone Encounter (Signed)
Refill request Vyvanse Last refill 01/23/23 #30 Last office visit 12/19/22 Upcoming 07/14/23 UDS 11/30/22

## 2023-02-15 ENCOUNTER — Other Ambulatory Visit: Payer: Self-pay

## 2023-02-15 ENCOUNTER — Other Ambulatory Visit (HOSPITAL_COMMUNITY): Payer: Self-pay

## 2023-02-15 MED ORDER — LISDEXAMFETAMINE DIMESYLATE 30 MG PO CAPS
30.0000 mg | ORAL_CAPSULE | Freq: Every day | ORAL | 0 refills | Status: DC
  Filled 2023-02-15: qty 30, 30d supply, fill #0

## 2023-02-15 NOTE — Telephone Encounter (Signed)
ERx lower 30mg  dose as per last mychart message.

## 2023-02-16 ENCOUNTER — Encounter (HOSPITAL_COMMUNITY): Payer: Self-pay

## 2023-02-16 ENCOUNTER — Other Ambulatory Visit: Payer: Self-pay

## 2023-02-16 ENCOUNTER — Other Ambulatory Visit (HOSPITAL_COMMUNITY): Payer: Self-pay

## 2023-02-17 ENCOUNTER — Other Ambulatory Visit (HOSPITAL_COMMUNITY): Payer: Self-pay

## 2023-02-20 ENCOUNTER — Other Ambulatory Visit: Payer: Self-pay

## 2023-02-21 ENCOUNTER — Other Ambulatory Visit (HOSPITAL_COMMUNITY): Payer: Self-pay

## 2023-02-21 ENCOUNTER — Other Ambulatory Visit: Payer: Self-pay

## 2023-02-22 ENCOUNTER — Other Ambulatory Visit: Payer: Self-pay | Admitting: Family Medicine

## 2023-02-22 ENCOUNTER — Other Ambulatory Visit (HOSPITAL_COMMUNITY): Payer: Self-pay

## 2023-02-22 DIAGNOSIS — F902 Attention-deficit hyperactivity disorder, combined type: Secondary | ICD-10-CM

## 2023-02-23 ENCOUNTER — Encounter: Payer: Self-pay | Admitting: Family Medicine

## 2023-02-23 ENCOUNTER — Other Ambulatory Visit (HOSPITAL_COMMUNITY): Payer: Self-pay

## 2023-02-23 NOTE — Telephone Encounter (Signed)
Patient called in st

## 2023-02-23 NOTE — Telephone Encounter (Signed)
error 

## 2023-02-23 NOTE — Telephone Encounter (Signed)
Patient called in to check on the status of this medication being called in?He stated the [facility]rie that was sending the medication to the pharmacy lost the shipment,so the pharmacy canceled that other,and was suppose to contact our office for another order to be sent to the pharmacy. Patient would like a phone call regarding this.

## 2023-02-23 NOTE — Telephone Encounter (Signed)
Left message to return call to our office.  

## 2023-02-24 ENCOUNTER — Other Ambulatory Visit (HOSPITAL_COMMUNITY): Payer: Self-pay

## 2023-02-24 MED ORDER — LISDEXAMFETAMINE DIMESYLATE 30 MG PO CAPS
30.0000 mg | ORAL_CAPSULE | Freq: Every day | ORAL | 0 refills | Status: DC
  Filled 2023-02-24 (×2): qty 30, 30d supply, fill #0

## 2023-02-24 NOTE — Telephone Encounter (Signed)
Noted  

## 2023-02-24 NOTE — Telephone Encounter (Signed)
I went ahead and sent in the prescription - thanks all!

## 2023-02-24 NOTE — Telephone Encounter (Signed)
[facility] called in stating that the patient medication  lisdexamfetamine (VYVANSE) 30 MG capsule was lost in the shipment,and would like to know If another rx could be called in?     [facility] (Ph: [phone])

## 2023-02-24 NOTE — Telephone Encounter (Signed)
Left a detailed message on voicemail (DPR) that script has been sent to the pharmacy.

## 2023-02-24 NOTE — Telephone Encounter (Signed)
Danny [name] this is the pt that I was telling you about.  Prescription pended. I will call the pt and let them know it will be sent today.  Thank you!

## 2023-03-01 ENCOUNTER — Other Ambulatory Visit: Payer: Self-pay

## 2023-03-01 ENCOUNTER — Other Ambulatory Visit (HOSPITAL_COMMUNITY): Payer: Self-pay

## 2023-03-10 ENCOUNTER — Other Ambulatory Visit (HOSPITAL_COMMUNITY): Payer: Self-pay

## 2023-03-10 ENCOUNTER — Other Ambulatory Visit: Payer: Self-pay

## 2023-03-11 ENCOUNTER — Other Ambulatory Visit: Payer: Self-pay

## 2023-03-13 ENCOUNTER — Other Ambulatory Visit (HOSPITAL_COMMUNITY): Payer: Self-pay

## 2023-03-14 ENCOUNTER — Other Ambulatory Visit: Payer: Self-pay

## 2023-03-15 ENCOUNTER — Other Ambulatory Visit: Payer: Self-pay

## 2023-03-19 ENCOUNTER — Other Ambulatory Visit: Payer: Self-pay | Admitting: Family Medicine

## 2023-03-19 DIAGNOSIS — F902 Attention-deficit hyperactivity disorder, combined type: Secondary | ICD-10-CM

## 2023-03-21 ENCOUNTER — Ambulatory Visit: Payer: 59 | Admitting: Family Medicine

## 2023-03-21 ENCOUNTER — Encounter: Payer: Self-pay | Admitting: Family Medicine

## 2023-03-21 ENCOUNTER — Telehealth: Payer: Self-pay | Admitting: Family Medicine

## 2023-03-21 ENCOUNTER — Other Ambulatory Visit (HOSPITAL_COMMUNITY): Payer: Self-pay

## 2023-03-21 VITALS — BP 136/80 | HR 74 | Temp 97.5°F | Ht 69.5 in | Wt 221.5 lb

## 2023-03-21 DIAGNOSIS — F902 Attention-deficit hyperactivity disorder, combined type: Secondary | ICD-10-CM

## 2023-03-21 DIAGNOSIS — E669 Obesity, unspecified: Secondary | ICD-10-CM | POA: Diagnosis not present

## 2023-03-21 DIAGNOSIS — E66811 Obesity, class 1: Secondary | ICD-10-CM

## 2023-03-21 DIAGNOSIS — F1020 Alcohol dependence, uncomplicated: Secondary | ICD-10-CM

## 2023-03-21 DIAGNOSIS — F39 Unspecified mood [affective] disorder: Secondary | ICD-10-CM

## 2023-03-21 MED ORDER — LISDEXAMFETAMINE DIMESYLATE 40 MG PO CAPS
40.0000 mg | ORAL_CAPSULE | Freq: Every day | ORAL | 0 refills | Status: DC
  Filled 2023-03-21: qty 30, 30d supply, fill #0

## 2023-03-21 MED ORDER — LISDEXAMFETAMINE DIMESYLATE 40 MG PO CAPS: 40.0000 mg | ORAL_CAPSULE | ORAL | 0 refills | Status: DC

## 2023-03-21 MED ORDER — FLUTICASONE PROPIONATE 50 MCG/ACT NA SUSP: 2.0000 | Freq: Every day | NASAL | Status: DC

## 2023-03-21 MED ORDER — LISDEXAMFETAMINE DIMESYLATE 40 MG PO CAPS
40.0000 mg | ORAL_CAPSULE | Freq: Every day | ORAL | 0 refills | Status: DC
Start: 2023-03-21 — End: 2023-05-22
  Filled 2023-03-23: qty 30, 30d supply, fill #0

## 2023-03-21 NOTE — Telephone Encounter (Signed)
Spoke to patient by telephone and was advised that his script for Vyvanse was sent to [facility] and they do not have any in stock. Patient stated that he was getting it from [facility] and ran into the same problem where they did not have it I stock. Patient wants to know if he can pick up a written script and check around to find out who has it and take the script to that pharmacy. Patient would like a call back when the prescription is ready for pickup.

## 2023-03-21 NOTE — Telephone Encounter (Addendum)
Spoke with pt notifying him Dr. Reece Agar printed rx (x3) for the next 3 mos. Pt verbalizes understanding and expresses his thanks.   [Placed printed rx at front office.]

## 2023-03-21 NOTE — Telephone Encounter (Addendum)
Duplicate request. Seen in office today.

## 2023-03-21 NOTE — Assessment & Plan Note (Addendum)
Remains abstinent over the past 2 months with daily naltrexone use.  Will need repeat LFTs at upcoming CPE

## 2023-03-21 NOTE — Patient Instructions (Addendum)
You are doing well today Continue Vyvanse, increased to 40mg  today.  Return in 4 months for physical, lab visit 1 week prior (already scheduled).

## 2023-03-21 NOTE — Telephone Encounter (Signed)
Pt called requesting a call back stated Pharmacy did not have RX would like to get a hand written proscription . Please advise [phone]

## 2023-03-21 NOTE — Assessment & Plan Note (Signed)
Chronic, stable period on Vyvanse 30mg  but notes loses effect more quickly.  Will increase dose to vyvanse 40mg  which was previously well tolerated.  Reassess at CPE in 4 months.

## 2023-03-21 NOTE — Progress Notes (Signed)
Ph: [phone]       Fax: [phone]   Patient ID: Danny [name], male    DOB: 15-Dec-1970, 52 y.o.   MRN: [id number]  This visit was conducted in person.  BP 136/80   Pulse 74   Temp (!) 97.5 F (36.4 C) (Temporal)   Ht 5' 9.5" (1.765 m)   Wt 221 lb 8 oz (100.5 kg)   SpO2 97%   BMI 32.24 kg/m    CC: 3 mo ADHD f/u visit  Subjective:   HPI: Danny [name] is a 52 y.o. male presenting on 03/21/2023 for Medical Management of Chronic Issues (Here for 3 mo ADHD f/u. Wants to discuss Vyvanse dose.)   ADHD diagnosed by testing through psychology (10/2022). On Vyvanse 30mg  with benefit, he felt 40mg  dose more effective with longer lasting effect.   Tolerating stimulant well without headache, chest pain, insomnia, anorexia, tics or mood changes.   Strattera previously tried, lost effectiveness.   Continues naltrexone 50mg  daily - no alcohol in last few months.   ?mood disorder - was previously on depakote, this was discontinued once vyvanse was started and he feels he's done well on this dose. He also continues celexa 20mg  daily.   21 lb weight loss in 3 months! Notes stimulant curbs appetite. He's also working out more regularly - regular walking routine.   Notes this year allergies haven't been as bad as previously, even off regular flonase. He notes he's been more regular with nasal saline irrigation use     Relevant past medical, surgical, family and social history reviewed and updated as indicated. Interim medical history since our last visit reviewed. Allergies and medications reviewed and updated. Outpatient Medications Prior to Visit  Medication Sig Dispense Refill   citalopram (CELEXA) 20 MG tablet Take 1 tablet (20 mg total) by mouth daily. 90 tablet 3   naltrexone (DEPADE) 50 MG tablet Take 1 tablet (50 mg total) by mouth daily. 90 tablet 3   lisdexamfetamine (VYVANSE) 30 MG capsule Take 1 capsule (30 mg total) by mouth daily. 30 capsule 0   divalproex (DEPAKOTE  ER) 500 MG 24 hr tablet Take 1 tablet (500 mg total) by mouth at bedtime. 30 tablet 6   fluticasone (FLONASE) 50 MCG/ACT nasal spray Place 2 sprays into both nostrils daily. 16 g 3   No facility-administered medications prior to visit.     Per HPI unless specifically indicated in ROS section below Review of Systems  Objective:  BP 136/80   Pulse 74   Temp (!) 97.5 F (36.4 C) (Temporal)   Ht 5' 9.5" (1.765 m)   Wt 221 lb 8 oz (100.5 kg)   SpO2 97%   BMI 32.24 kg/m   Wt Readings from Last 3 Encounters:  03/21/23 221 lb 8 oz (100.5 kg)  12/19/22 242 lb 6 oz (109.9 kg)  11/30/22 246 lb 4 oz (111.7 kg)      Physical Exam Vitals and nursing note reviewed.  Constitutional:      Appearance: Normal appearance. He is not ill-appearing.  HENT:     Mouth/Throat:     Mouth: Mucous membranes are moist.     Pharynx: Oropharynx is clear. No oropharyngeal exudate or posterior oropharyngeal erythema.  Eyes:     Extraocular Movements: Extraocular movements intact.     Pupils: Pupils are equal, round, and reactive to light.  Cardiovascular:     Rate and Rhythm: Normal rate and regular rhythm.     Pulses:  Normal pulses.     Heart sounds: Normal heart sounds. No murmur heard. Pulmonary:     Effort: Pulmonary effort is normal. No respiratory distress.     Breath sounds: Normal breath sounds. No wheezing, rhonchi or rales.  Musculoskeletal:     Right lower leg: No edema.     Left lower leg: No edema.  Skin:    General: Skin is warm and dry.     Findings: No rash.  Neurological:     Mental Status: He is alert.  Psychiatric:        Mood and Affect: Mood normal.        Behavior: Behavior normal.       Results for orders placed or performed in visit on 11/30/22  DRUG MONITORING, PANEL 8 WITH CONFIRMATION, URINE  Result Value Ref Range   Alcohol Metabolites NEGATIVE <500 ng/mL   Amphetamines NEGATIVE <500 ng/mL   Benzodiazepines NEGATIVE <100 ng/mL   Buprenorphine, Urine NEGATIVE  <5 ng/mL   Cocaine Metabolite NEGATIVE <150 ng/mL   6 Acetylmorphine NEGATIVE <10 ng/mL   Marijuana Metabolite NEGATIVE <20 ng/mL   MDMA NEGATIVE <500 ng/mL   Opiates NEGATIVE <100 ng/mL   Oxycodone NEGATIVE <100 ng/mL   Creatinine 100.6 > or = 20.0 mg/dL   pH 6.7 4.5 - 9.0   Oxidant NEGATIVE <200 mcg/mL  DM TEMPLATE  Result Value Ref Range   Notes and Comments     Lab Results  Component Value Date   ALT 15 07/04/2022   AST 15 07/04/2022   ALKPHOS 60 07/04/2022   BILITOT 0.4 07/04/2022       03/21/2023    9:32 AM 12/19/2022   11:41 AM 11/30/2022    4:20 PM 07/11/2022    9:59 AM 06/11/2021   12:19 PM  Depression screen PHQ 2/9  Decreased Interest 1 1 1 1 1   Down, Depressed, Hopeless 0 1 1 1 1   PHQ - 2 Score 1 2 2 2 2   Altered sleeping 0 0 2 0 0  Tired, decreased energy 1 1 3 3 2   Change in appetite 1 1 2 1 1   Feeling bad or failure about yourself  0 0 1 1 1   Trouble concentrating 1 1 3 3 2   Moving slowly or fidgety/restless 0 1 3 0 0  Suicidal thoughts 0 0 0 0 0  PHQ-9 Score 4 6 16 10 8   Difficult doing work/chores Somewhat difficult Not difficult at all  Very difficult        03/21/2023    9:33 AM 12/19/2022   11:41 AM 11/30/2022    4:20 PM 07/11/2022    9:59 AM  GAD 7 : Generalized Anxiety Score  Nervous, Anxious, on Edge 1 0 2 1  Control/stop worrying 1 0 2 1  Worry too much - different things 1 0 3 1  Trouble relaxing 1 0 3 1  Restless 0 0 3 0  Easily annoyed or irritable 1 0 2 0  Afraid - awful might happen 0 0 1 0  Total GAD 7 Score 5 0 16 4  Anxiety Difficulty Somewhat difficult  Very difficult Somewhat difficult   Assessment & Plan:   Problem List Items Addressed This Visit     Mood disorder (HCC)    No true mania or hypomania. ?due to prior alcohol use.   Stable period off depakote, only on celexa, stable PHQ9/GAD7 scores. Continue current regimen.       Obesity, Class I, BMI 30.0-34.9 (  see actual BMI)    Congratulated on 20 lb weight loss over  the past 3 months! He is motivated to continue healthy diet and lifestyle choices, regular exercise routine.       Moderate alcohol use disorder (HCC)    Remains abstinent over the past 2 months with daily naltrexone use.  Will need repeat LFTs at upcoming CPE      Attention deficit hyperactivity disorder (ADHD), combined type, moderate - Primary    Chronic, stable period on Vyvanse 30mg  but notes loses effect more quickly.  Will increase dose to vyvanse 40mg  which was previously well tolerated.  Reassess at CPE in 4 months.       Relevant Medications   lisdexamfetamine (VYVANSE) 40 MG capsule     Meds ordered this encounter  Medications   lisdexamfetamine (VYVANSE) 40 MG capsule    Sig: Take 1 capsule (40 mg total) by mouth daily.    Dispense:  30 capsule    Refill:  0    Note new dose   fluticasone (FLONASE) 50 MCG/ACT nasal spray    Sig: Place 2 sprays into both nostrils daily.   lisdexamfetamine (VYVANSE) 40 MG capsule    Sig: Take 1 capsule (40 mg total) by mouth every morning.    Dispense:  30 capsule    Refill:  0   lisdexamfetamine (VYVANSE) 40 MG capsule    Sig: Take 1 capsule (40 mg total) by mouth every morning.    Dispense:  30 capsule    Refill:  0    No orders of the defined types were placed in this encounter.   Patient Instructions  You are doing well today Continue Vyvanse, increased to 40mg  today.  Return in 4 months for physical, lab visit 1 week prior (already scheduled).   Follow up plan: No follow-ups on file.  Eustaquio Boyden, MD

## 2023-03-21 NOTE — Assessment & Plan Note (Addendum)
No true mania or hypomania. ?due to prior alcohol use.   Stable period off depakote, only on celexa, stable PHQ9/GAD7 scores. Continue current regimen.

## 2023-03-21 NOTE — Assessment & Plan Note (Addendum)
Congratulated on 20 lb weight loss over the past 3 months! He is motivated to continue healthy diet and lifestyle choices, regular exercise routine.

## 2023-03-21 NOTE — Telephone Encounter (Signed)
Rx printed and in Lisa's box.  Plz notify pt.  I've printed 3 Rx for next 3 months - to take to pharmacy.

## 2023-03-22 ENCOUNTER — Other Ambulatory Visit (HOSPITAL_COMMUNITY): Payer: Self-pay

## 2023-03-23 ENCOUNTER — Other Ambulatory Visit (HOSPITAL_COMMUNITY): Payer: Self-pay

## 2023-03-23 ENCOUNTER — Other Ambulatory Visit: Payer: Self-pay

## 2023-03-29 ENCOUNTER — Other Ambulatory Visit (HOSPITAL_COMMUNITY): Payer: Self-pay

## 2023-03-30 ENCOUNTER — Other Ambulatory Visit (HOSPITAL_COMMUNITY): Payer: Self-pay

## 2023-04-18 ENCOUNTER — Other Ambulatory Visit (HOSPITAL_COMMUNITY): Payer: Self-pay

## 2023-04-20 ENCOUNTER — Other Ambulatory Visit (HOSPITAL_COMMUNITY): Payer: Self-pay

## 2023-05-22 ENCOUNTER — Other Ambulatory Visit: Payer: Self-pay

## 2023-05-22 ENCOUNTER — Other Ambulatory Visit (HOSPITAL_COMMUNITY): Payer: Self-pay

## 2023-05-22 MED ORDER — LISDEXAMFETAMINE DIMESYLATE 40 MG PO CAPS
40.0000 mg | ORAL_CAPSULE | Freq: Every morning | ORAL | 0 refills | Status: DC
  Filled 2023-05-22: qty 30, 30d supply, fill #0

## 2023-06-14 ENCOUNTER — Other Ambulatory Visit: Payer: Self-pay | Admitting: Family Medicine

## 2023-06-14 DIAGNOSIS — F902 Attention-deficit hyperactivity disorder, combined type: Secondary | ICD-10-CM

## 2023-06-14 NOTE — Telephone Encounter (Signed)
Name of Medication:  Vyvanse Name of Pharmacy:  [facility] or Written Date and Quantity:  05/23/23, #30 Last Office Visit and Type:  03/21/23, ADHD f/u Next Office Visit and Type:  07/14/23, CPE Last Controlled Substance Agreement Date:  11/30/22 Last UDS:  11/30/22

## 2023-06-15 ENCOUNTER — Other Ambulatory Visit: Payer: Self-pay

## 2023-06-19 ENCOUNTER — Other Ambulatory Visit (HOSPITAL_COMMUNITY): Payer: Self-pay

## 2023-06-19 MED ORDER — LISDEXAMFETAMINE DIMESYLATE 40 MG PO CAPS
40.0000 mg | ORAL_CAPSULE | Freq: Every morning | ORAL | 0 refills | Status: DC
Start: 2023-06-20 — End: 2023-07-14
  Filled 2023-06-20: qty 30, 30d supply, fill #0

## 2023-06-19 NOTE — Telephone Encounter (Signed)
See wife's letter.  Spoke with patient. He feels he's tolerating med well. Weight stable at 222 lbs.

## 2023-06-20 ENCOUNTER — Other Ambulatory Visit: Payer: Self-pay

## 2023-06-20 ENCOUNTER — Other Ambulatory Visit (HOSPITAL_COMMUNITY): Payer: Self-pay

## 2023-06-21 ENCOUNTER — Encounter (INDEPENDENT_AMBULATORY_CARE_PROVIDER_SITE_OTHER): Payer: Self-pay

## 2023-07-01 ENCOUNTER — Other Ambulatory Visit: Payer: Self-pay | Admitting: Family Medicine

## 2023-07-01 DIAGNOSIS — F1020 Alcohol dependence, uncomplicated: Secondary | ICD-10-CM

## 2023-07-01 DIAGNOSIS — Z125 Encounter for screening for malignant neoplasm of prostate: Secondary | ICD-10-CM

## 2023-07-01 DIAGNOSIS — E781 Pure hyperglyceridemia: Secondary | ICD-10-CM

## 2023-07-07 ENCOUNTER — Other Ambulatory Visit (INDEPENDENT_AMBULATORY_CARE_PROVIDER_SITE_OTHER): Payer: 59

## 2023-07-07 DIAGNOSIS — F1020 Alcohol dependence, uncomplicated: Secondary | ICD-10-CM

## 2023-07-07 DIAGNOSIS — Z125 Encounter for screening for malignant neoplasm of prostate: Secondary | ICD-10-CM

## 2023-07-07 DIAGNOSIS — E781 Pure hyperglyceridemia: Secondary | ICD-10-CM

## 2023-07-07 LAB — CBC WITH DIFFERENTIAL/PLATELET
Basophils Absolute: 0.1 10*3/uL (ref 0.0–0.1)
Basophils Relative: 1.1 % (ref 0.0–3.0)
Eosinophils Absolute: 0.2 10*3/uL (ref 0.0–0.7)
Eosinophils Relative: 3.5 % (ref 0.0–5.0)
HCT: 45.9 % (ref 39.0–52.0)
Hemoglobin: 15.2 g/dL (ref 13.0–17.0)
Lymphocytes Relative: 21 % (ref 12.0–46.0)
Lymphs Abs: 1.4 10*3/uL (ref 0.7–4.0)
MCHC: 33.1 g/dL (ref 30.0–36.0)
MCV: 90.9 fl (ref 78.0–100.0)
Monocytes Absolute: 0.8 10*3/uL (ref 0.1–1.0)
Monocytes Relative: 11.8 % (ref 3.0–12.0)
Neutro Abs: 4.1 10*3/uL (ref 1.4–7.7)
Neutrophils Relative %: 62.6 % (ref 43.0–77.0)
Platelets: 234 10*3/uL (ref 150.0–400.0)
RBC: 5.05 Mil/uL (ref 4.22–5.81)
RDW: 13.6 % (ref 11.5–15.5)
WBC: 6.5 10*3/uL (ref 4.0–10.5)

## 2023-07-07 LAB — COMPREHENSIVE METABOLIC PANEL
ALT: 22 U/L (ref 0–53)
AST: 19 U/L (ref 0–37)
Albumin: 3.9 g/dL (ref 3.5–5.2)
Alkaline Phosphatase: 66 U/L (ref 39–117)
BUN: 10 mg/dL (ref 6–23)
CO2: 29 mEq/L (ref 19–32)
Calcium: 8.9 mg/dL (ref 8.4–10.5)
Chloride: 98 mEq/L (ref 96–112)
Creatinine, Ser: 0.65 mg/dL (ref 0.40–1.50)
GFR: 108.69 mL/min (ref >60.00)
Glucose, Bld: 102 mg/dL — ABNORMAL HIGH (ref 70–99)
Potassium: 4 mEq/L (ref 3.5–5.1)
Sodium: 133 mEq/L — ABNORMAL LOW (ref 135–145)
Total Bilirubin: 0.4 mg/dL (ref 0.2–1.2)
Total Protein: 6.2 g/dL (ref 6.0–8.3)

## 2023-07-07 LAB — LIPID PANEL
Cholesterol: 176 mg/dL (ref 0–200)
HDL: 46.6 mg/dL (ref >39.00)
LDL Cholesterol: 115 mg/dL — ABNORMAL HIGH (ref 0–99)
NonHDL: 129.18
Total CHOL/HDL Ratio: 4
Triglycerides: 73 mg/dL (ref 0.0–149.0)
VLDL: 14.6 mg/dL (ref 0.0–40.0)

## 2023-07-07 LAB — VITAMIN B12: Vitamin B-12: 927 pg/mL — ABNORMAL HIGH (ref 211–911)

## 2023-07-07 LAB — FOLATE: Folate: 8.9 ng/mL (ref >5.9)

## 2023-07-07 LAB — PSA: PSA: 0.68 ng/mL (ref 0.10–4.00)

## 2023-07-11 LAB — VITAMIN B1: Vitamin B1 (Thiamine): 15 nmol/L (ref 8–30)

## 2023-07-14 ENCOUNTER — Other Ambulatory Visit: Payer: Self-pay | Admitting: Family Medicine

## 2023-07-14 ENCOUNTER — Encounter: Payer: Self-pay | Admitting: Family Medicine

## 2023-07-14 ENCOUNTER — Ambulatory Visit (INDEPENDENT_AMBULATORY_CARE_PROVIDER_SITE_OTHER): Payer: 59 | Admitting: Family Medicine

## 2023-07-14 VITALS — BP 138/76 | HR 76 | Temp 97.6°F | Ht 69.69 in | Wt 221.6 lb

## 2023-07-14 DIAGNOSIS — Z0000 Encounter for general adult medical examination without abnormal findings: Secondary | ICD-10-CM

## 2023-07-14 DIAGNOSIS — E669 Obesity, unspecified: Secondary | ICD-10-CM | POA: Diagnosis not present

## 2023-07-14 DIAGNOSIS — F902 Attention-deficit hyperactivity disorder, combined type: Secondary | ICD-10-CM | POA: Diagnosis not present

## 2023-07-14 DIAGNOSIS — E66811 Obesity, class 1: Secondary | ICD-10-CM

## 2023-07-14 DIAGNOSIS — F39 Unspecified mood [affective] disorder: Secondary | ICD-10-CM | POA: Diagnosis not present

## 2023-07-14 DIAGNOSIS — E781 Pure hyperglyceridemia: Secondary | ICD-10-CM

## 2023-07-14 DIAGNOSIS — F1020 Alcohol dependence, uncomplicated: Secondary | ICD-10-CM

## 2023-07-14 MED ORDER — LISDEXAMFETAMINE DIMESYLATE 40 MG PO CAPS: 40.0000 mg | ORAL_CAPSULE | ORAL | 0 refills | Status: DC

## 2023-07-14 MED ORDER — LISDEXAMFETAMINE DIMESYLATE 40 MG PO CAPS
40.0000 mg | ORAL_CAPSULE | Freq: Every morning | ORAL | 0 refills | Status: DC
Start: 2023-09-19 — End: 2023-10-13

## 2023-07-14 NOTE — Assessment & Plan Note (Signed)
Chronic, stable period on vyvanse 40mg  daily - continue this.

## 2023-07-14 NOTE — Assessment & Plan Note (Signed)
No excessive drinking on monthly naltrexone shots through psychiatry.

## 2023-07-14 NOTE — Assessment & Plan Note (Signed)
Has established with psychiatrist, now on topamax + monthly naltrexone IM, as well as continues celexa and vyvanse through our office.

## 2023-07-14 NOTE — Patient Instructions (Addendum)
Consider shingrix series shot  You are doing well today.  Good to see you today Return as needed or in 6 months for follow up visit

## 2023-07-14 NOTE — Assessment & Plan Note (Signed)
Preventative protocols reviewed and updated unless pt declined. Discussed healthy diet and lifestyle.  

## 2023-07-14 NOTE — Progress Notes (Signed)
Ph: [phone] Fax: [phone]   Patient ID: Danny [name], male    DOB: 05-03-1971, 52 y.o.   MRN: [id number]  This visit was conducted in person.  BP 138/76   Pulse 76   Temp 97.6 F (36.4 C) (Temporal)   Ht 5' 9.69" (1.77 m)   Wt 221 lb 9.6 oz (100.5 kg)   SpO2 97%   BMI 32.08 kg/m    CC: CPE Subjective:   HPI: Danny [name] is a 52 y.o. male presenting on 07/14/2023 for Annual Exam   Stays active at work.  Seen at [facility] last week wit h2 wks of cough. Treated with augmentin, delsym, nyquil without significant benefit. No fevers/chills. Tested negative for COVID  Depression:     07/14/2023    8:54 AM 03/21/2023    9:32 AM 12/19/2022   11:41 AM 11/30/2022    4:20 PM 07/11/2022    9:59 AM  Depression screen PHQ 2/9  Decreased Interest 0 1 1 1 1   Down, Depressed, Hopeless 1 0 1 1 1   PHQ - 2 Score 1 1 2 2 2   Altered sleeping 1 0 0 2 0  Tired, decreased energy 1 1 1 3 3   Change in appetite 1 1 1 2 1   Feeling bad or failure about yourself  1 0 0 1 1  Trouble concentrating 1 1 1 3 3   Moving slowly or fidgety/restless 1 0 1 3 0  Suicidal thoughts 0 0 0 0 0  PHQ-9 Score 7 4 6 16 10   Difficult doing work/chores Not difficult at all Somewhat difficult Not difficult at all  Very difficult     Anxiety:     07/14/2023    8:55 AM 03/21/2023    9:33 AM 12/19/2022   11:41 AM 11/30/2022    4:20 PM  GAD 7 : Generalized Anxiety Score  Nervous, Anxious, on Edge 1 1 0 2  Control/stop worrying 1 1 0 2  Worry too much - different things 1 1 0 3  Trouble relaxing 1 1 0 3  Restless 0 0 0 3  Easily annoyed or irritable 1 1 0 2  Afraid - awful might happen 0 0 0 1  Total GAD 7 Score 5 5 0 16  Anxiety Difficulty Somewhat difficult Somewhat difficult  Very difficult     MDQ7+1:  Has there ever been a period of time when you were not your usual self and:  You felt so good or hyper that other people thought you were not your normal self or you got into trouble? no You were so  irritable that you shouted at people or started fights or arguments? no You felt much more self-confident than usual? no You got much less sleep than usual and found you didn't really miss it? no Thoughts raced through your head or you couldn't slow your mind down? no You were so easily distracted by things around you that you had trouble concentrating or staying on track? yes You had much more energy than usual? yes You were much more active or did many more things than usual? no You were much more social or outgoing than usual, for example you telephoned friends in the middle of the night? no You were much more interested in sex than usual? no You did things that were unusual for you or that other ppl might have thought excessive, foolish, risky? no Spending money got you or your family into trouble? no If YES to  more than one, have several of these ever happened during the same period? no How much of a problem did any of these cause you - unable to work, having family, money, legal troubles, getting into arguments or fights? No prob  Minor prob Moderate prob   Serious prob  Have any blood relative had manic-depressive illness or bipolar disorder? yes Has a health professional ever told you that you have manic-depression illness or bipolar disorder? no  Mood disorder ?Bipolar 2 vs alcohol related mood disorder - continues celexa 20mg  daily, stopped depakote 500mg  bid due to somnolence - 10/2022 diagnosed with ADHD through psychological testing, started on Vyvanse 40mg  daily. He specifically denies headache, chest pain, insomnia, anorexia, tics or mood changes.  21 lb weight loss since starting stimulant.    Alcohol abuse history - managed with naltrexone PRN. Last year went through intensive outpatient treatment program at [facility].  Sees therapist August Albino, as well as newly established with psychiatrist Dr Tonna Corner [facility]). Receiving monthly naltrexone shot. Also taking  topamax 100mg  nightly - started earlier this month.   Preventative: Colonoscopy 12/2021 - diverticulosis, int hem, rpt 10 yrs ([facility]) Prostate cancer screening - yearly screen. Fmhx prostate cancer (father age 33). No nocturia or weak stream.  Lung cancer screening - not eligible  Flu shot - declines COVID vaccine - declines  Tdap 2010. Tdap 06/2022 Shingrix - discussed, declines  Seat belt use discussed.  Sunscreen use discussed. No changing moles on skin. Sees dermatologist Q41mo Sleep - averages 8 hours of sleep at night  Ex smoker. Quit 2011, some chewing.  H/o alcohol abuse - largely abstinent since 09/2019, completed 6 wk intensive outpatient program 2023 Dentist q6 mo  Eye exam yearly   From [place] Married, 1992 Lives with wife and 2 kids (daughter Danny [name], son Danny [name])  Occ: Works for [employer] (contracting) Activity: no regular exercise, walking some, yard work  Diet: good water and milk, some fruits/vegetables      Relevant past medical, surgical, family and social history reviewed and updated as indicated. Interim medical history since our last visit reviewed. Allergies and medications reviewed and updated. Outpatient Medications Prior to Visit  Medication Sig Dispense Refill   citalopram (CELEXA) 20 MG tablet Take 1 tablet (20 mg total) by mouth daily. 90 tablet 3   fluticasone (FLONASE) 50 MCG/ACT nasal spray Place 2 sprays into both nostrils daily.     Naltrexone POWD Once monthly IM naltrexone shot     lisdexamfetamine (VYVANSE) 40 MG capsule Take 1 capsule (40 mg total) by mouth every morning. (6/30) 30 capsule 0   lisdexamfetamine (VYVANSE) 40 MG capsule Take 1 capsule (40 mg total) by mouth every morning. (5/30) 30 capsule 0   lisdexamfetamine (VYVANSE) 40 MG capsule Take 1 capsule (40 mg total) by mouth every morning. 30 capsule 0   naltrexone (DEPADE) 50 MG tablet Take 1 tablet (50 mg) by mouth daily. 90 tablet 3   naltrexone (DEPADE) 50 MG tablet  Take 50 mg by mouth daily.     topiramate (TOPAMAX) 50 MG tablet Take 50 mg by mouth as needed.     topiramate (TOPAMAX) 50 MG tablet Take 2 tablets (100 mg total) by mouth at bedtime.     No facility-administered medications prior to visit.     Per HPI unless specifically indicated in ROS section below Review of Systems  Constitutional:  Negative for activity change, appetite change, chills, fatigue, fever and unexpected weight change.  HENT:  Negative for hearing loss.   Eyes:  Negative for visual disturbance.  Respiratory:  Positive for cough (productive cough). Negative for chest tightness, shortness of breath and wheezing.   Cardiovascular:  Negative for chest pain, palpitations and leg swelling.  Gastrointestinal:  Negative for abdominal distention, abdominal pain, blood in stool, constipation, diarrhea, nausea and vomiting.  Genitourinary:  Negative for difficulty urinating and hematuria.  Musculoskeletal:  Negative for arthralgias, myalgias and neck pain.       R knee swelling  Skin:  Negative for rash.  Neurological:  Negative for dizziness, seizures, syncope and headaches.  Hematological:  Negative for adenopathy. Does not bruise/bleed easily.  Psychiatric/Behavioral:  Negative for dysphoric mood. The patient is not nervous/anxious.     Objective:  BP 138/76   Pulse 76   Temp 97.6 F (36.4 C) (Temporal)   Ht 5' 9.69" (1.77 m)   Wt 221 lb 9.6 oz (100.5 kg)   SpO2 97%   BMI 32.08 kg/m   Wt Readings from Last 3 Encounters:  07/14/23 221 lb 9.6 oz (100.5 kg)  03/21/23 221 lb 8 oz (100.5 kg)  12/19/22 242 lb 6 oz (109.9 kg)      Physical Exam Vitals and nursing note reviewed.  Constitutional:      General: He is not in acute distress.    Appearance: Normal appearance. He is well-developed. He is not ill-appearing.  HENT:     Head: Normocephalic and atraumatic.     Right Ear: Hearing, tympanic membrane, ear canal and external ear normal.     Left Ear: Hearing,  tympanic membrane, ear canal and external ear normal.     Nose: Nose normal.     Mouth/Throat:     Mouth: Mucous membranes are moist.     Pharynx: Oropharynx is clear. No oropharyngeal exudate or posterior oropharyngeal erythema.  Eyes:     General: No scleral icterus.    Extraocular Movements: Extraocular movements intact.     Conjunctiva/sclera: Conjunctivae normal.     Pupils: Pupils are equal, round, and reactive to light.  Neck:     Thyroid: No thyroid mass or thyromegaly.  Cardiovascular:     Rate and Rhythm: Normal rate and regular rhythm.     Pulses: Normal pulses.          Radial pulses are 2+ on the right side and 2+ on the left side.     Heart sounds: Normal heart sounds. No murmur heard. Pulmonary:     Effort: Pulmonary effort is normal. No respiratory distress.     Breath sounds: Normal breath sounds. No wheezing, rhonchi or rales.     Comments: Lungs clear Abdominal:     General: Bowel sounds are normal. There is no distension.     Palpations: Abdomen is soft. There is no mass.     Tenderness: There is no abdominal tenderness. There is no guarding or rebound.     Hernia: No hernia is present.  Musculoskeletal:        General: Normal range of motion.     Cervical back: Normal range of motion and neck supple.     Right lower leg: No edema.     Left lower leg: No edema.  Lymphadenopathy:     Cervical: No cervical adenopathy.  Skin:    General: Skin is warm and dry.     Findings: No rash.  Neurological:     General: No focal deficit present.     Mental Status: He is  alert and oriented to person, place, and time.  Psychiatric:        Mood and Affect: Mood normal.        Behavior: Behavior normal.        Thought Content: Thought content normal.        Judgment: Judgment normal.       Results for orders placed or performed in visit on 07/07/23  Vitamin B1  Result Value Ref Range   Vitamin B1 (Thiamine) 15 8 - 30 nmol/L  Folate  Result Value Ref Range    Folate 8.9 >5.9 ng/mL  Vitamin B12  Result Value Ref Range   Vitamin B-12 927 (H) 211 - 911 pg/mL  CBC with Differential/Platelet  Result Value Ref Range   WBC 6.5 4.0 - 10.5 K/uL   RBC 5.05 4.22 - 5.81 Mil/uL   Hemoglobin 15.2 13.0 - 17.0 g/dL   HCT 13.0 86.5 - 78.4 %   MCV 90.9 78.0 - 100.0 fl   MCHC 33.1 30.0 - 36.0 g/dL   RDW 69.6 29.5 - 28.4 %   Platelets 234.0 150.0 - 400.0 K/uL   Neutrophils Relative % 62.6 43.0 - 77.0 %   Lymphocytes Relative 21.0 12.0 - 46.0 %   Monocytes Relative 11.8 3.0 - 12.0 %   Eosinophils Relative 3.5 0.0 - 5.0 %   Basophils Relative 1.1 0.0 - 3.0 %   Neutro Abs 4.1 1.4 - 7.7 K/uL   Lymphs Abs 1.4 0.7 - 4.0 K/uL   Monocytes Absolute 0.8 0.1 - 1.0 K/uL   Eosinophils Absolute 0.2 0.0 - 0.7 K/uL   Basophils Absolute 0.1 0.0 - 0.1 K/uL  PSA  Result Value Ref Range   PSA 0.68 0.10 - 4.00 ng/mL  Comprehensive metabolic panel  Result Value Ref Range   Sodium 133 (L) 135 - 145 mEq/L   Potassium 4.0 3.5 - 5.1 mEq/L   Chloride 98 96 - 112 mEq/L   CO2 29 19 - 32 mEq/L   Glucose, Bld 102 (H) 70 - 99 mg/dL   BUN 10 6 - 23 mg/dL   Creatinine, Ser 1.32 0.40 - 1.50 mg/dL   Total Bilirubin 0.4 0.2 - 1.2 mg/dL   Alkaline Phosphatase 66 39 - 117 U/L   AST 19 0 - 37 U/L   ALT 22 0 - 53 U/L   Total Protein 6.2 6.0 - 8.3 g/dL   Albumin 3.9 3.5 - 5.2 g/dL   GFR 440.10 >27.25 mL/min   Calcium 8.9 8.4 - 10.5 mg/dL  Lipid panel  Result Value Ref Range   Cholesterol 176 0 - 200 mg/dL   Triglycerides 36.6 0.0 - 149.0 mg/dL   HDL 44.03 >47.42 mg/dL   VLDL 59.5 0.0 - 63.8 mg/dL   LDL Cholesterol 756 (H) 0 - 99 mg/dL   Total CHOL/HDL Ratio 4    NonHDL 129.18     Assessment & Plan:   Problem List Items Addressed This Visit     Health maintenance examination - Primary (Chronic)    Preventative protocols reviewed and updated unless pt declined. Discussed healthy diet and lifestyle.       Mood disorder (HCC)    Has established with psychiatrist, now on  topamax + monthly naltrexone IM, as well as continues celexa and vyvanse through our office.       Obesity, Class I, BMI 30.0-34.9 (see actual BMI)    Chronic, stable period maintaining weight loss since 02/2023.       Moderate alcohol  use disorder (HCC)    No excessive drinking on monthly naltrexone shots through psychiatry.       Hypertriglyceridemia    Chronic, stable period with decrease in alcohol intake. The 10-year ASCVD risk score (Arnett DK, et al., 2019) is: 4.4%   Values used to calculate the score:     Age: 28 years     Sex: Male     Is Non-Hispanic African American: No     Diabetic: No     Tobacco smoker: No     Systolic Blood Pressure: 138 mmHg     Is BP treated: No     HDL Cholesterol: 46.6 mg/dL     Total Cholesterol: 176 mg/dL       Attention deficit hyperactivity disorder (ADHD), combined type, moderate    Chronic, stable period on vyvanse 40mg  daily - continue this.       Relevant Medications   lisdexamfetamine (VYVANSE) 40 MG capsule (Start on 09/19/2023)     Meds ordered this encounter  Medications   lisdexamfetamine (VYVANSE) 40 MG capsule    Sig: Take 1 capsule (40 mg total) by mouth every morning.    Dispense:  30 capsule    Refill:  0   lisdexamfetamine (VYVANSE) 40 MG capsule    Sig: Take 1 capsule (40 mg total) by mouth every morning.    Dispense:  30 capsule    Refill:  0   lisdexamfetamine (VYVANSE) 40 MG capsule    Sig: Take 1 capsule (40 mg total) by mouth every morning.    Dispense:  30 capsule    Refill:  0    No orders of the defined types were placed in this encounter.   Patient Instructions  Consider shingrix series shot  You are doing well today.  Good to see you today Return as needed or in 6 months for follow up visit   Follow up plan: Return in about 6 months (around 01/14/2024) for follow up visit.  Eustaquio Boyden, MD

## 2023-07-14 NOTE — Assessment & Plan Note (Signed)
Chronic, stable period maintaining weight loss since 02/2023.

## 2023-07-14 NOTE — Assessment & Plan Note (Addendum)
Chronic, stable period with decrease in alcohol intake. The 10-year ASCVD risk score (Arnett DK, et al., 2019) is: 4.4%   Values used to calculate the score:     Age: 52 years     Sex: Male     Is Non-Hispanic African American: No     Diabetic: No     Tobacco smoker: No     Systolic Blood Pressure: 138 mmHg     Is BP treated: No     HDL Cholesterol: 46.6 mg/dL     Total Cholesterol: 176 mg/dL

## 2023-08-30 ENCOUNTER — Telehealth: Payer: Self-pay

## 2023-08-30 ENCOUNTER — Ambulatory Visit: Payer: 59 | Admitting: Orthopedic Surgery

## 2023-08-30 ENCOUNTER — Other Ambulatory Visit (INDEPENDENT_AMBULATORY_CARE_PROVIDER_SITE_OTHER): Payer: 59

## 2023-08-30 DIAGNOSIS — M25561 Pain in right knee: Secondary | ICD-10-CM | POA: Diagnosis not present

## 2023-08-30 DIAGNOSIS — M1711 Unilateral primary osteoarthritis, right knee: Secondary | ICD-10-CM

## 2023-08-30 NOTE — Telephone Encounter (Signed)
Right knee gel 

## 2023-08-31 ENCOUNTER — Encounter: Payer: Self-pay | Admitting: Orthopedic Surgery

## 2023-08-31 NOTE — Progress Notes (Signed)
Office Visit Note   Patient: Danny [name]           Date of Birth: Jul 03, 1971           MRN: [id number] Visit Date: 08/30/2023 Requested by: Eustaquio Boyden, MD [address] [place],  [place] [address] PCP: Eustaquio Boyden, MD  Subjective: Chief Complaint  Patient presents with   Right Knee - Pain    HPI: Danny [name] is a 52 y.o. male who presents to the office reporting right knee pain.  Has chronic knee pain and swelling along with stiffness.  Describes mechanical symptoms as well including pain that wakes him from sleep at night over the past several months.  It used to just hurt with walking but now he is having more rest type pain.  Takes Tylenol without much relief..                ROS: All systems reviewed are negative as they relate to the chief complaint within the history of present illness.  Patient denies fevers or chills.  Assessment & Plan: Visit Diagnoses:  1. Right knee pain, unspecified chronicity   2. Primary osteoarthritis of right knee     Plan: Impression is right knee arthritis.  Aspiration and injection performed today.  Gel shot pending.  Wants to try to delay knee replacement as long as possible.  Follow-up for gel injection after this cortisone injection wears out. This patient is diagnosed with osteoarthritis of the knee(s).    Radiographs show evidence of joint space narrowing, osteophytes, subchondral sclerosis and/or subchondral cysts.  This patient has knee pain which interferes with functional and activities of daily living.    This patient has experienced inadequate response, adverse effects and/or intolerance with conservative treatments such as acetaminophen, NSAIDS, topical creams, physical therapy or regular exercise, knee bracing and/or weight loss.   This patient has experienced inadequate response or has a contraindication to intra articular steroid injections for at least 3 months.   This patient is not scheduled to have a  total knee replacement within 6 months of starting treatment with viscosupplementation.   Follow-Up Instructions: No follow-ups on file.   Orders:  Orders Placed This Encounter  Procedures   XR KNEE 3 VIEW RIGHT   No orders of the defined types were placed in this encounter.     Procedures: Large Joint Inj: R knee on 08/30/2023 6:26 AM Indications: diagnostic evaluation, joint swelling and pain Details: 18 G 1.5 in needle, superolateral approach  Arthrogram: No  Medications: 5 mL lidocaine 1 %; 40 mg methylPREDNISolone acetate 40 MG/ML; 4 mL bupivacaine 0.25 % Outcome: tolerated well, no immediate complications Procedure, treatment alternatives, risks and benefits explained, specific risks discussed. Consent was given by the patient. Immediately prior to procedure a time out was called to verify the correct patient, procedure, equipment, support staff and site/side marked as required. Patient was prepped and draped in the usual sterile fashion.       Clinical Data: No additional findings.  Objective: Vital Signs: There were no vitals taken for this visit.  Physical Exam:  Constitutional: Patient appears well-developed HEENT:  Head: Normocephalic Eyes:EOM are normal Neck: Normal range of motion Cardiovascular: Normal rate Pulmonary/chest: Effort normal Neurologic: Patient is alert Skin: Skin is warm Psychiatric: Patient has normal mood and affect  Ortho Exam: Ortho exam demonstrates just shy of 10 degree flexion contracture of the right knee.  Collateral cruciate ligaments are stable.  Pedal pulses intact.  Extensor mechanism intact.  No groin pain with internal/external Tatian of the leg.  No masses lymphadenopathy or skin changes noted in that right knee region.  Gait is slightly antalgic to the right.  Specialty Comments:  No specialty comments available.  Imaging: XR KNEE 3 VIEW RIGHT  Result Date: 08/31/2023 AP lateral merchant radiographs right knee  reviewed.  No acute fracture.  Tricompartmental arthritis which is moderate to severe is present in all 3 compartments.  Alignment slight varus    PMFS History: Patient Active Problem List   Diagnosis Date Noted   Attention deficit hyperactivity disorder (ADHD), combined type, moderate 07/11/2022   Daytime somnolence 05/19/2020   Primary osteoarthritis of right knee 05/24/2019   Family history of prostate cancer in father 02/03/2019   Neuropathy 02/06/2017   Hypertriglyceridemia 10/08/2016   Moderate alcohol use disorder (HCC) 07/15/2016   Obesity, Class I, BMI 30.0-34.9 (see actual BMI) 10/12/2015   Health maintenance examination 10/12/2015   OCD (obsessive compulsive disorder) 08/06/2015   Chronic congestion of paranasal sinus 08/06/2015   Mood disorder (HCC)    Past Medical History:  Diagnosis Date   Anxiety and depression    states controlled with meds   Arthritis    generalized   COVID-19 virus infection 09/2019   tested positive   Headache(784.0)    sinus headaches   Impaired glucose tolerance    denies diabetes- states glucose elevated x 1 in past   Multiple allergies    seansonal,environmental   Perianal fistula, posterior midline 02/06/2012   Pilonidal cyst     Family History  Problem Relation Age of Onset   Depression Mother    Prostate cancer Father 28   Colon polyps Father 47   Anxiety disorder Father    Heart disease Maternal Grandfather    Cancer Paternal Grandfather        lung   Colon cancer Neg Hx    Esophageal cancer Neg Hx    Rectal cancer Neg Hx    Stomach cancer Neg Hx     Past Surgical History:  Procedure Laterality Date   ANAL FISTULECTOMY  03/08/2012   EUA   ANTERIOR CRUCIATE LIGAMENT REPAIR  11/21/2006   right   COLONOSCOPY  12/2021   diverticulosis, int hem, rpt 10 yrs ([facility])   SHOULDER ARTHROSCOPY WITH ROTATOR CUFF REPAIR AND SUBACROMIAL DECOMPRESSION Right 12/10/2013   Procedure: SHOULDER ARTHROSCOPY WITH SUBACROMIAL  DECOMPRESSION, DISTAL CALVICLE RESECTION, OPEN ROTATOR CUFF REPAIR.;  Surgeon: Valeria Batman, MD;  Location: [facility];  Service: Orthopedics;  Laterality: Right;   VASECTOMY  11/21/1998   WISDOM TOOTH EXTRACTION     right  lower   Social History   Occupational History   Not on file  Tobacco Use   Smoking status: Former   Smokeless tobacco: Current    Types: Chew  Vaping Use   Vaping status: Never Used  Substance and Sexual Activity   Alcohol use: No    Alcohol/week: 0.0 standard drinks of alcohol    Comment: 12 pk week--Quit Dec 2017   Drug use: No   Sexual activity: Not on file

## 2023-08-31 NOTE — Telephone Encounter (Signed)
VOB submitted for Durolane, right knee.

## 2023-09-02 MED ORDER — LIDOCAINE HCL 1 % IJ SOLN
5.0000 mL | INTRAMUSCULAR | Status: AC | PRN
Start: 2023-08-30 — End: 2023-08-30
  Administered 2023-08-30: 5 mL

## 2023-09-02 MED ORDER — BUPIVACAINE HCL 0.25 % IJ SOLN
4.0000 mL | INTRAMUSCULAR | Status: AC | PRN
Start: 2023-08-30 — End: 2023-08-30
  Administered 2023-08-30: 4 mL via INTRA_ARTICULAR

## 2023-09-02 MED ORDER — METHYLPREDNISOLONE ACETATE 40 MG/ML IJ SUSP
40.0000 mg | INTRAMUSCULAR | Status: AC | PRN
Start: 2023-08-30 — End: 2023-08-30
  Administered 2023-08-30: 40 mg via INTRA_ARTICULAR

## 2023-10-12 ENCOUNTER — Other Ambulatory Visit: Payer: Self-pay | Admitting: Family Medicine

## 2023-10-12 NOTE — Telephone Encounter (Signed)
Prescription Request  10/12/2023  LOV: 07/14/2023  What is the name of the medication or equipment? lisdexamfetamine (VYVANSE) 40 MG capsule   Have you contacted your pharmacy to request a refill? No   Which pharmacy would you like this sent to?  Patient stated that Dr Reece Agar has been hand writing this rx for him since he has a hard time getting it from the pharmacy,he would like to know if he could do the same this time?    Patient notified that their request is being sent to the clinical staff for review and that they should receive a response within 2 business days.   Please advise at Mobile [phone] (mobile)

## 2023-10-12 NOTE — Telephone Encounter (Signed)
LAST APPOINTMENT DATE: 07/14/2023 CPE    NEXT APPOINTMENT DATE: Visit date not found Patient asked to make f/u in 6 months 2/25   LAST REFILL:09/19/23  QTY: #30  UDS 11/30/22 Contract 11/30/22

## 2023-10-13 MED ORDER — LISDEXAMFETAMINE DIMESYLATE 40 MG PO CAPS: 40.0000 mg | ORAL_CAPSULE | Freq: Every day | ORAL | 0 refills | Status: DC

## 2023-10-13 NOTE — Telephone Encounter (Signed)
Rx printed and in Lisa's box.

## 2023-10-13 NOTE — Telephone Encounter (Signed)
Spoke with pt notifying him rx was printed and ready for pick up. Pt expresses his thanks.   [Placed rx at front office. Made copy to scan.]

## 2023-10-23 ENCOUNTER — Telehealth: Payer: Self-pay | Admitting: Orthopedic Surgery

## 2023-10-23 ENCOUNTER — Other Ambulatory Visit: Payer: Self-pay | Admitting: Surgical

## 2023-10-23 MED ORDER — DICLOFENAC SODIUM 75 MG PO TBEC: 75.0000 mg | DELAYED_RELEASE_TABLET | Freq: Two times a day (BID) | ORAL | 1 refills | Status: DC

## 2023-10-23 NOTE — Telephone Encounter (Signed)
Sent in rx for voltaren oral

## 2023-10-23 NOTE — Telephone Encounter (Signed)
Pt called requesting pain medication or inflammation. Please send to [facility]. Pt phone number is [phone].

## 2023-10-23 NOTE — Telephone Encounter (Signed)
Called and advised pt.

## 2023-11-07 ENCOUNTER — Other Ambulatory Visit: Payer: Self-pay | Admitting: Family Medicine

## 2023-11-07 DIAGNOSIS — F39 Unspecified mood [affective] disorder: Secondary | ICD-10-CM

## 2023-11-09 ENCOUNTER — Ambulatory Visit: Payer: 59 | Admitting: Orthopedic Surgery

## 2023-11-09 ENCOUNTER — Telehealth: Payer: Self-pay

## 2023-11-09 ENCOUNTER — Encounter: Payer: Self-pay | Admitting: Orthopedic Surgery

## 2023-11-09 DIAGNOSIS — M1711 Unilateral primary osteoarthritis, right knee: Secondary | ICD-10-CM | POA: Diagnosis not present

## 2023-11-09 MED ORDER — SODIUM HYALURONATE 60 MG/3ML IX PRSY
60.0000 mg | PREFILLED_SYRINGE | INTRA_ARTICULAR | Status: AC | PRN
Start: 2023-11-09 — End: 2023-11-09
  Administered 2023-11-09: 60 mg via INTRA_ARTICULAR

## 2023-11-09 MED ORDER — LIDOCAINE HCL 1 % IJ SOLN
5.0000 mL | INTRAMUSCULAR | Status: AC | PRN
Start: 2023-11-09 — End: 2023-11-09
  Administered 2023-11-09: 5 mL

## 2023-11-09 NOTE — Telephone Encounter (Signed)
Auth needed for right knee gel in 6 months

## 2023-11-09 NOTE — Progress Notes (Signed)
Office Visit Note   Patient: Danny [name]           Date of Birth: [birth year]           MRN: [id number] Visit Date: 11/09/2023 Requested by: Eustaquio Boyden, MD [address] [place],  [place] [address] PCP: Eustaquio Boyden, MD  Subjective: Chief Complaint  Patient presents with   Right Knee - Follow-up    HPI: Danny [name] is a 52 y.o. male who presents to the office reporting right knee pain.  He was considering operative versus nonoperative intervention for his right knee pain.  Had prior cortisone injection 08/30/2023.  Old records are reviewed.  Nonsteroidals have also been taken.  He aggravated his knee doing some yard work..                ROS: All systems reviewed are negative as they relate to the chief complaint within the history of present illness.  Patient denies fevers or chills.  Assessment & Plan: Visit Diagnoses:  1. Primary osteoarthritis of right knee     Plan: Impression is right knee arthritis which is becoming severe.  He wants to avoid knee replacement as long as possible due to his age.  Gel shot performed today.  Follow-up in mid February for cortisone injection and we will start the cycle of every 3 month injections at that time. This patient is diagnosed with osteoarthritis of the knee(s).    Radiographs show evidence of joint space narrowing, osteophytes, subchondral sclerosis and/or subchondral cysts.  This patient has knee pain which interferes with functional and activities of daily living.    This patient has experienced inadequate response, adverse effects and/or intolerance with conservative treatments such as acetaminophen, NSAIDS, topical creams, physical therapy or regular exercise, knee bracing and/or weight loss.   This patient has experienced inadequate response or has a contraindication to intra articular steroid injections for at least 3 months.   This patient is not scheduled to have a total knee replacement within 6 months of  starting treatment with viscosupplementation.  Follow-Up Instructions: No follow-ups on file.   Orders:  No orders of the defined types were placed in this encounter.  No orders of the defined types were placed in this encounter.     Procedures: Large Joint Inj: R knee on 11/09/2023 9:24 PM Indications: diagnostic evaluation, joint swelling and pain Details: 18 G 1.5 in needle, superolateral approach  Arthrogram: No  Medications: 5 mL lidocaine 1 %; 60 mg Sodium Hyaluronate 60 MG/3ML Outcome: tolerated well, no immediate complications Procedure, treatment alternatives, risks and benefits explained, specific risks discussed. Consent was given by the patient. Immediately prior to procedure a time out was called to verify the correct patient, procedure, equipment, support staff and site/side marked as required. Patient was prepped and draped in the usual sterile fashion.     Lot #[id number]  Clinical Data: No additional findings.  Objective: Vital Signs: There were no vitals taken for this visit.  Physical Exam:  Constitutional: Patient appears well-developed HEENT:  Head: Normocephalic Eyes:EOM are normal Neck: Normal range of motion Cardiovascular: Normal rate Pulmonary/chest: Effort normal Neurologic: Patient is alert Skin: Skin is warm Psychiatric: Patient has normal mood and affect  Ortho Exam: Ortho exam demonstrates range of motion of 15-1 10.  Trace effusion.  Extensor mechanism intact.  No groin pain with internal/external rotation of the leg.  Collateral cruciate ligaments are stable.  Specialty Comments:  No specialty comments available.  Imaging:  No results found.   PMFS History: Patient Active Problem List   Diagnosis Date Noted   Attention deficit hyperactivity disorder (ADHD), combined type, moderate 07/11/2022   Daytime somnolence 05/19/2020   Primary osteoarthritis of right knee 05/24/2019   Family history of prostate cancer in father 02/03/2019    Neuropathy 02/06/2017   Hypertriglyceridemia 10/08/2016   Moderate alcohol use disorder (HCC) 07/15/2016   Obesity, Class I, BMI 30.0-34.9 (see actual BMI) 10/12/2015   Health maintenance examination 10/12/2015   OCD (obsessive compulsive disorder) 08/06/2015   Chronic congestion of paranasal sinus 08/06/2015   Mood disorder (HCC)    Past Medical History:  Diagnosis Date   Anxiety and depression    states controlled with meds   Arthritis    generalized   COVID-19 virus infection 09/2019   tested positive   Headache(784.0)    sinus headaches   Impaired glucose tolerance    denies diabetes- states glucose elevated x 1 in past   Multiple allergies    seansonal,environmental   Perianal fistula, posterior midline 02/06/2012   Pilonidal cyst     Family History  Problem Relation Age of Onset   Depression Mother    Prostate cancer Father 46   Colon polyps Father 44   Anxiety disorder Father    Heart disease Maternal Grandfather    Cancer Paternal Grandfather        lung   Colon cancer Neg Hx    Esophageal cancer Neg Hx    Rectal cancer Neg Hx    Stomach cancer Neg Hx     Past Surgical History:  Procedure Laterality Date   ANAL FISTULECTOMY  03/08/2012   EUA   ANTERIOR CRUCIATE LIGAMENT REPAIR  11/21/2006   right   COLONOSCOPY  12/2021   diverticulosis, int hem, rpt 10 yrs ([facility])   SHOULDER ARTHROSCOPY WITH ROTATOR CUFF REPAIR AND SUBACROMIAL DECOMPRESSION Right 12/10/2013   Procedure: SHOULDER ARTHROSCOPY WITH SUBACROMIAL DECOMPRESSION, DISTAL CALVICLE RESECTION, OPEN ROTATOR CUFF REPAIR.;  Surgeon: Valeria Batman, MD;  Location: [facility];  Service: Orthopedics;  Laterality: Right;   VASECTOMY  11/21/1998   WISDOM TOOTH EXTRACTION     right  lower   Social History   Occupational History   Not on file  Tobacco Use   Smoking status: Former   Smokeless tobacco: Current    Types: Chew  Vaping Use   Vaping status: Never Used  Substance and  Sexual Activity   Alcohol use: No    Alcohol/week: 0.0 standard drinks of alcohol    Comment: 12 pk week--Quit Dec 2017   Drug use: No   Sexual activity: Not on file

## 2023-11-13 ENCOUNTER — Telehealth: Payer: Self-pay | Admitting: Orthopedic Surgery

## 2023-11-13 ENCOUNTER — Other Ambulatory Visit: Payer: Self-pay

## 2023-11-13 ENCOUNTER — Other Ambulatory Visit: Payer: Self-pay | Admitting: Orthopedic Surgery

## 2023-11-13 DIAGNOSIS — M1711 Unilateral primary osteoarthritis, right knee: Secondary | ICD-10-CM

## 2023-11-13 MED ORDER — TRAMADOL HCL 50 MG PO TABS
50.0000 mg | ORAL_TABLET | Freq: Four times a day (QID) | ORAL | 0 refills | Status: DC | PRN
Start: 2023-11-13 — End: 2023-12-22

## 2023-11-13 NOTE — Telephone Encounter (Signed)
Patient is requesting Rx for pain for the right knee.  He states the aleve helps along with Rx for inflammation but the sharp pain keeps him awake at night and the throbbing when walking.  Patient uses [facility]

## 2023-11-13 NOTE — Telephone Encounter (Signed)
Patient called regarding the right knee. He states the gel injection he took last week does not keep to be helping.  He would like to proceed with scheduling surgery.  Please provide surgery sheet if surgery is in order.  Patient's cell is [phone] if there are any questions about his decision.

## 2023-11-13 NOTE — Telephone Encounter (Signed)
Meds sent thx

## 2023-11-13 NOTE — Telephone Encounter (Signed)
Blue sheet done

## 2023-11-20 NOTE — Telephone Encounter (Signed)
Next available gel injection would need to be after 04/08/2024 Will submit endo of Danny [name], 2025

## 2023-11-30 ENCOUNTER — Telehealth: Payer: Self-pay | Admitting: Orthopedic Surgery

## 2023-11-30 NOTE — Telephone Encounter (Signed)
 Called patient to offer surgery dates available for right total knee with Dr. August Saucer.  Called [phone] but unable to leave a message as it was full.

## 2023-12-14 NOTE — Progress Notes (Signed)
Surgical Instructions    Your procedure is scheduled on Thursday December 21, 2023.  Report to [facility] Main Entrance "A" at 10:10 A.M., then check in with the Admitting office.  Call this number if you have problems the morning of surgery:  [phone]   If you have any questions prior to your surgery date call [phone]: Open Monday-Friday 8am-4pm If you experience any cold or flu symptoms such as cough, fever, chills, shortness of breath, etc. between now and your scheduled surgery, please notify us at the above number     Remember:  Do not eat after midnight the night before your surgery  You may drink clear liquids until 9:10 AM the morning of your surgery.   Clear liquids allowed are: Water, Non-Citrus Juices (without pulp), Carbonated Beverages, Clear Tea, Black Coffee ONLY (NO MILK, CREAM OR POWDERED CREAMER of any kind), and Gatorade.   Please complete your PRE-SURGERY ENSURE that was provided to you by 9:10 AM the morning of surgery.  Please, if able, drink it in one setting. DO NOT SIP.   Take these medicines the morning of surgery with A SIP OF WATER:  citalopram (CELEXA)  divalproex (DEPAKOTE ER)  lisdexamfetamine (VYVANSE)   If Needed:  traMADol (ULTRAM)   As of today, STOP taking any Aspirin (unless otherwise instructed by your surgeon) Aleve, Naproxen, Ibuprofen, Motrin, Advil, Goody's, BC's, all herbal medications, fish oil, and all vitamins.  Special instructions:    Oral Hygiene is also important to reduce your risk of infection.  Remember - BRUSH YOUR TEETH THE MORNING OF SURGERY WITH YOUR REGULAR TOOTHPASTE   [facility]- Preparing For Surgery  Before surgery, you can play an important role. Because skin is not sterile, your skin needs to be as free of germs as possible. You can reduce the number of germs on your skin by washing with CHG (chlorahexidine gluconate) Soap before surgery.  CHG is an antiseptic cleaner which kills germs and bonds with the  skin to continue killing germs even after washing.     Please do not use if you have an allergy to CHG or antibacterial soaps. If your skin becomes reddened/irritated stop using the CHG.  Do not shave (including legs and underarms) for at least 48 hours prior to first CHG shower. It is OK to shave your face.  Please follow these instructions carefully.     Shower the NIGHT BEFORE SURGERY and the MORNING OF SURGERY with CHG Soap.   If you chose to wash your hair, wash your hair first as usual with your normal shampoo. After you shampoo, rinse your hair and body thoroughly to remove the shampoo.  Then [employer] and genitals (private parts) with your normal soap and rinse thoroughly to remove soap.  After that Use CHG Soap as you would any other liquid soap. You can apply CHG directly to the skin and wash gently with a scrungie or a clean washcloth.   Apply the CHG Soap to your body ONLY FROM THE NECK DOWN.  Do not use on open wounds or open sores. Avoid contact with your eyes, ears, mouth and genitals (private parts). Wash Face and genitals (private parts)  with your normal soap.   Wash thoroughly, paying special attention to the area where your surgery will be performed.  Thoroughly rinse your body with warm water from the neck down.  DO NOT shower/wash with your normal soap after using and rinsing off the CHG Soap.  Pat yourself dry with a  CLEAN TOWEL.  Wear CLEAN PAJAMAS to bed the night before surgery  Place CLEAN SHEETS on your bed the night before your surgery  DO NOT SLEEP WITH PETS.   Day of Surgery:  Take a shower with CHG soap. Wear Clean/Comfortable clothing the morning of surgery Do not apply any deodorants/lotions.   Remember to brush your teeth WITH YOUR REGULAR TOOTHPASTE.  [facility] is not responsible for any belongings or valuables.    Do NOT Smoke (Tobacco/Vaping)  24 hours prior to your procedure  If you use a CPAP at night, you may bring your mask for  your overnight stay.   Contacts, glasses, hearing aids, dentures or partials may not be worn into surgery, please bring cases for these belongings   For patients admitted to the hospital, discharge time will be determined by your treatment team.   Patients discharged the day of surgery will not be allowed to drive home, and someone needs to stay with them for 24 hours.   SURGICAL WAITING ROOM VISITATION Patients having surgery or a procedure may have no more than 2 support people in the waiting area - these visitors may rotate.   Children under the age of 37 must have an adult with them who is not the patient. If the patient needs to stay at the hospital during part of their recovery, the visitor guidelines for inpatient rooms apply. Pre-op nurse will coordinate an appropriate time for 1 support person to accompany patient in pre-op.  This support person may not rotate.   Please refer to [email or web address] for the visitor guidelines for Inpatients (after your surgery is over and you are in a regular room).   If you received a COVID test during your pre-op visit, it is requested that you wear a mask when out in public, stay away from anyone that may not be feeling well, and notify your surgeon if you develop symptoms. If you have been in contact with anyone that has tested positive in the last 10 days, please notify your surgeon.    Please read over the following fact sheets that you were given.

## 2023-12-15 ENCOUNTER — Other Ambulatory Visit: Payer: Self-pay

## 2023-12-15 ENCOUNTER — Encounter (HOSPITAL_COMMUNITY): Payer: Self-pay

## 2023-12-15 ENCOUNTER — Encounter (HOSPITAL_COMMUNITY)
Admission: RE | Admit: 2023-12-15 | Discharge: 2023-12-15 | Disposition: A | Discharge status: Home / Self Care | Payer: 59 | Source: Ambulatory Visit | Attending: Orthopedic Surgery | Admitting: Orthopedic Surgery

## 2023-12-15 VITALS — BP 130/82 | HR 80 | Temp 98.2°F | Resp 17 | Ht 70.0 in | Wt 234.7 lb

## 2023-12-15 DIAGNOSIS — Z01812 Encounter for preprocedural laboratory examination: Secondary | ICD-10-CM | POA: Insufficient documentation

## 2023-12-15 DIAGNOSIS — Z01818 Encounter for other preprocedural examination: Secondary | ICD-10-CM

## 2023-12-15 LAB — CBC
HCT: 46.2 % (ref 39.0–52.0)
Hemoglobin: 15.7 g/dL (ref 13.0–17.0)
MCH: 31 pg (ref 26.0–34.0)
MCHC: 34 g/dL (ref 30.0–36.0)
MCV: 91.1 fL (ref 80.0–100.0)
Platelets: 228 10*3/uL (ref 150–400)
RBC: 5.07 MIL/uL (ref 4.22–5.81)
RDW: 12.6 % (ref 11.5–15.5)
WBC: 5.7 10*3/uL (ref 4.0–10.5)
nRBC: 0 % (ref 0.0–0.2)

## 2023-12-15 LAB — BASIC METABOLIC PANEL
Anion gap: 8 (ref 5–15)
BUN: 16 mg/dL (ref 6–20)
CO2: 26 mmol/L (ref 22–32)
Calcium: 9.1 mg/dL (ref 8.9–10.3)
Chloride: 105 mmol/L (ref 98–111)
Creatinine, Ser: 0.8 mg/dL (ref 0.61–1.24)
GFR, Estimated: >60 mL/min (ref >60)
Glucose, Bld: 96 mg/dL (ref 70–99)
Potassium: 3.9 mmol/L (ref 3.5–5.1)
Sodium: 139 mmol/L (ref 135–145)

## 2023-12-15 LAB — URINALYSIS, W/ REFLEX TO CULTURE (INFECTION SUSPECTED)
Bacteria, UA: NONE SEEN
Bilirubin Urine: NEGATIVE
Glucose, UA: NEGATIVE mg/dL
Hgb urine dipstick: NEGATIVE
Ketones, ur: 5 mg/dL — AB
Leukocytes,Ua: NEGATIVE
Nitrite: NEGATIVE
Protein, ur: NEGATIVE mg/dL
Specific Gravity, Urine: 1.02 (ref 1.005–1.030)
pH: 7 (ref 5.0–8.0)

## 2023-12-15 LAB — SURGICAL PCR SCREEN
MRSA, PCR: NEGATIVE
Staphylococcus aureus: NEGATIVE

## 2023-12-15 NOTE — Progress Notes (Signed)
Pre-operative 5 CHG Bath Instructions   You can play a key role in reducing the risk of infection after surgery. Your skin needs to be as free of germs as possible. You can reduce the number of germs on your skin by washing with CHG (chlorhexidine gluconate) soap before surgery. CHG is an antiseptic soap that kills germs and continues to kill germs even after washing.   DO NOT use if you have an allergy to chlorhexidine/CHG or antibacterial soaps. If your skin becomes reddened or irritated, stop using the CHG and notify one of our RNs at [phone].   Please shower with the CHG soap starting 4 days before surgery using the following schedule:     Please keep in mind the following:  DO NOT shave, including legs and underarms, starting the day of your first shower.   You may shave your face at any point before/day of surgery.  Place clean sheets on your bed the day you start using CHG soap. Use a clean washcloth (not used since being washed) for each shower. DO NOT sleep with pets once you start using the CHG.   CHG Shower Instructions:  If you choose to wash your hair and private area, wash first with your normal shampoo/soap.  After you use shampoo/soap, rinse your hair and body thoroughly to remove shampoo/soap residue.  Turn the water OFF and apply about 3 tablespoons (45 ml) of CHG soap to a CLEAN washcloth.  Apply CHG soap ONLY FROM YOUR NECK DOWN TO YOUR TOES (washing for 3-5 minutes)  DO NOT use CHG soap on face, private areas, open wounds, or sores.  Pay special attention to the area where your surgery is being performed.  If you are having back surgery, having someone wash your back for you may be helpful. Wait 2 minutes after CHG soap is applied, then you may rinse off the CHG soap.  Pat dry with a clean towel  Put on clean clothes/pajamas   If you choose to wear lotion, please use ONLY the CHG-compatible lotions on the back of this paper.     Additional instructions for  the day of surgery: DO NOT APPLY any lotions, deodorants, cologne, or perfumes.   Put on clean/comfortable clothes.  Brush your teeth.  Ask your nurse before applying any prescription medications to the skin.      CHG Compatible Lotions   Aveeno Moisturizing lotion  Cetaphil Moisturizing Cream  Cetaphil Moisturizing Lotion  Clairol Herbal Essence Moisturizing Lotion, Dry Skin  Clairol Herbal Essence Moisturizing Lotion, Extra Dry Skin  Clairol Herbal Essence Moisturizing Lotion, Normal Skin  Curel Age Defying Therapeutic Moisturizing Lotion with Alpha Hydroxy  Curel Extreme Care Body Lotion  Curel Soothing Hands Moisturizing Hand Lotion  Curel Therapeutic Moisturizing Cream, Fragrance-Free  Curel Therapeutic Moisturizing Lotion, Fragrance-Free  Curel Therapeutic Moisturizing Lotion, Original Formula  Eucerin Daily Replenishing Lotion  Eucerin Dry Skin Therapy Plus Alpha Hydroxy Crme  Eucerin Dry Skin Therapy Plus Alpha Hydroxy Lotion  Eucerin Original Crme  Eucerin Original Lotion  Eucerin Plus Crme Eucerin Plus Lotion  Eucerin TriLipid Replenishing Lotion  Keri Anti-Bacterial Hand Lotion  Keri Deep Conditioning Original Lotion Dry Skin Formula Softly Scented  Keri Deep Conditioning Original Lotion, Fragrance Free Sensitive Skin Formula  Keri Lotion Fast Absorbing Fragrance Free Sensitive Skin Formula  Keri Lotion Fast Absorbing Softly Scented Dry Skin Formula  Keri Original Lotion  Keri Skin Renewal Lotion Keri Silky Smooth Lotion  Keri Silky Smooth Sensitive Skin Lotion  Nivea Body Creamy Conditioning Oil  Nivea Body Extra Enriched Teacher, adult education Moisturizing Lotion Nivea Crme  Nivea Skin Firming Lotion  NutraDerm 30 Skin Lotion  NutraDerm Skin Lotion  NutraDerm Therapeutic Skin Cream  NutraDerm Therapeutic Skin Lotion  ProShield Protective Hand Cream  Provon moisturizing lotion

## 2023-12-15 NOTE — Progress Notes (Signed)
PCP - Eustaquio Boyden, MD  Cardiologist -   PPM/ICD - denies Device Orders - n/a Rep Notified - n/a  Chest x-ray - denies EKG - denies Stress Test - denies ECHO - denies Cardiac Cath - denies  Sleep Study - denies CPAP - n/a  DM- denies  Blood Thinner Instructions: denies Aspirin Instructions:n/a  ERAS Protcol - clear liquids until 9:10 PRE-SURGERY Ensure   COVID TEST- n/a   Anesthesia review: no  Patient denies shortness of breath, fever, cough and chest pain at PAT appointment   All instructions explained to the patient, with a verbal understanding of the material. Patient agrees to go over the instructions while at home for a better understanding. Patient also instructed to self quarantine after being tested for COVID-19. The opportunity to ask questions was provided.

## 2023-12-21 ENCOUNTER — Encounter (HOSPITAL_COMMUNITY)
Admission: RE | Disposition: A | Discharge status: Home / Self Care | Payer: Self-pay | Source: Home / Self Care | Attending: Orthopedic Surgery

## 2023-12-21 ENCOUNTER — Other Ambulatory Visit: Payer: Self-pay

## 2023-12-21 ENCOUNTER — Encounter (HOSPITAL_COMMUNITY): Payer: Self-pay | Admitting: Orthopedic Surgery

## 2023-12-21 ENCOUNTER — Ambulatory Visit (HOSPITAL_COMMUNITY): Payer: 59 | Admitting: Anesthesiology

## 2023-12-21 ENCOUNTER — Ambulatory Visit (HOSPITAL_BASED_OUTPATIENT_CLINIC_OR_DEPARTMENT_OTHER): Payer: 59 | Admitting: Anesthesiology

## 2023-12-21 ENCOUNTER — Observation Stay (HOSPITAL_COMMUNITY)
Admission: RE | Admit: 2023-12-21 | Discharge: 2023-12-22 | Disposition: A | Discharge status: Home / Self Care | Payer: 59 | Attending: Orthopedic Surgery | Admitting: Orthopedic Surgery

## 2023-12-21 DIAGNOSIS — Z96651 Presence of right artificial knee joint: Secondary | ICD-10-CM

## 2023-12-21 DIAGNOSIS — Z8616 Personal history of COVID-19: Secondary | ICD-10-CM | POA: Insufficient documentation

## 2023-12-21 DIAGNOSIS — M1711 Unilateral primary osteoarthritis, right knee: Secondary | ICD-10-CM

## 2023-12-21 DIAGNOSIS — Z87891 Personal history of nicotine dependence: Secondary | ICD-10-CM | POA: Insufficient documentation

## 2023-12-21 DIAGNOSIS — Z01818 Encounter for other preprocedural examination: Principal | ICD-10-CM

## 2023-12-21 HISTORY — PX: TOTAL KNEE ARTHROPLASTY: SHX125

## 2023-12-21 SURGERY — ARTHROPLASTY, KNEE, TOTAL
Anesthesia: Regional | Site: Knee | Laterality: Right

## 2023-12-21 MED ORDER — ONDANSETRON HCL 4 MG/2ML IJ SOLN: 4.0000 mg | Freq: Once | INTRAMUSCULAR | Status: DC | PRN

## 2023-12-21 MED ORDER — HYDROMORPHONE HCL 1 MG/ML IJ SOLN
0.5000 mg | INTRAMUSCULAR | Status: DC | PRN
  Administered 2023-12-21 – 2023-12-22 (×3): 0.5 mg via INTRAVENOUS
  Filled 2023-12-21 (×3): qty 0.5

## 2023-12-21 MED ORDER — CLONIDINE HCL (ANALGESIA) 100 MCG/ML EP SOLN
EPIDURAL | Status: AC
  Filled 2023-12-21: qty 10

## 2023-12-21 MED ORDER — OXYCODONE HCL 5 MG/5ML PO SOLN: 5.0000 mg | Freq: Once | ORAL | Status: DC | PRN

## 2023-12-21 MED ORDER — OXYCODONE HCL 5 MG PO TABS: 5.0000 mg | ORAL_TABLET | Freq: Once | ORAL | Status: DC | PRN

## 2023-12-21 MED ORDER — VANCOMYCIN HCL 1000 MG IV SOLR
INTRAVENOUS | Status: AC
  Filled 2023-12-21: qty 20

## 2023-12-21 MED ORDER — TRANEXAMIC ACID-NACL 1000-0.7 MG/100ML-% IV SOLN
1000.0000 mg | INTRAVENOUS | Status: AC
  Administered 2023-12-21: 1000 mg via INTRAVENOUS
  Filled 2023-12-21: qty 100

## 2023-12-21 MED ORDER — OXYCODONE HCL 5 MG PO TABS
5.0000 mg | ORAL_TABLET | ORAL | Status: DC | PRN
  Administered 2023-12-21 – 2023-12-22 (×3): 10 mg via ORAL
  Filled 2023-12-21 (×3): qty 2

## 2023-12-21 MED ORDER — MORPHINE SULFATE (PF) 4 MG/ML IV SOLN
INTRAVENOUS | Status: AC
  Filled 2023-12-21: qty 2

## 2023-12-21 MED ORDER — VANCOMYCIN HCL 1000 MG IV SOLR
INTRAVENOUS | Status: DC | PRN
  Administered 2023-12-21: 1000 mg

## 2023-12-21 MED ORDER — METHOCARBAMOL 500 MG PO TABS
500.0000 mg | ORAL_TABLET | Freq: Four times a day (QID) | ORAL | Status: DC | PRN
  Administered 2023-12-21: 500 mg via ORAL
  Filled 2023-12-21: qty 1

## 2023-12-21 MED ORDER — ONDANSETRON HCL 4 MG/2ML IJ SOLN
INTRAMUSCULAR | Status: AC
  Filled 2023-12-21: qty 2

## 2023-12-21 MED ORDER — MEPERIDINE HCL 25 MG/ML IJ SOLN: 6.2500 mg | INTRAMUSCULAR | Status: DC | PRN

## 2023-12-21 MED ORDER — 0.9 % SODIUM CHLORIDE (POUR BTL) OPTIME
TOPICAL | Status: DC | PRN
  Administered 2023-12-21 (×5): 1000 mL

## 2023-12-21 MED ORDER — MORPHINE SULFATE (PF) 4 MG/ML IV SOLN
INTRAVENOUS | Status: DC | PRN
  Administered 2023-12-21: 13 mL

## 2023-12-21 MED ORDER — POVIDONE-IODINE 10 % EX SWAB
2.0000 | Freq: Once | CUTANEOUS | Status: DC
Start: 2023-12-21 — End: 2023-12-21

## 2023-12-21 MED ORDER — MIDAZOLAM HCL 2 MG/2ML IJ SOLN: 2.0000 mg | Freq: Once | INTRAMUSCULAR | Status: AC

## 2023-12-21 MED ORDER — HYDROMORPHONE HCL 1 MG/ML IJ SOLN
INTRAMUSCULAR | Status: AC
  Filled 2023-12-21: qty 0.5

## 2023-12-21 MED ORDER — PHENOL 1.4 % MT LIQD: 1.0000 | OROMUCOSAL | Status: DC | PRN

## 2023-12-21 MED ORDER — IRRISEPT - 450ML BOTTLE WITH 0.05% CHG IN STERILE WATER, USP 99.95% OPTIME
TOPICAL | Status: DC | PRN
  Administered 2023-12-21: 450 mL

## 2023-12-21 MED ORDER — DIVALPROEX SODIUM ER 250 MG PO TB24
250.0000 mg | ORAL_TABLET | Freq: Every day | ORAL | Status: DC
  Administered 2023-12-22: 250 mg via ORAL
  Filled 2023-12-21: qty 1

## 2023-12-21 MED ORDER — TRANEXAMIC ACID 1000 MG/10ML IV SOLN
2000.0000 mg | INTRAVENOUS | Status: DC
  Filled 2023-12-21: qty 20

## 2023-12-21 MED ORDER — ONDANSETRON HCL 4 MG PO TABS: 4.0000 mg | ORAL_TABLET | Freq: Four times a day (QID) | ORAL | Status: DC | PRN

## 2023-12-21 MED ORDER — ACETAMINOPHEN 325 MG PO TABS: 325.0000 mg | ORAL_TABLET | ORAL | Status: DC | PRN

## 2023-12-21 MED ORDER — GLYCOPYRROLATE PF 0.2 MG/ML IJ SOSY
PREFILLED_SYRINGE | INTRAMUSCULAR | Status: AC
  Filled 2023-12-21: qty 1

## 2023-12-21 MED ORDER — TRANEXAMIC ACID-NACL 1000-0.7 MG/100ML-% IV SOLN
INTRAVENOUS | Status: AC
  Filled 2023-12-21: qty 100

## 2023-12-21 MED ORDER — DEXAMETHASONE SODIUM PHOSPHATE 10 MG/ML IJ SOLN
INTRAMUSCULAR | Status: DC | PRN
  Administered 2023-12-21: 8 mg via INTRAVENOUS

## 2023-12-21 MED ORDER — CEFAZOLIN SODIUM-DEXTROSE 2-4 GM/100ML-% IV SOLN
2.0000 g | Freq: Four times a day (QID) | INTRAVENOUS | Status: AC
  Administered 2023-12-21 (×2): 2 g via INTRAVENOUS
  Filled 2023-12-21 (×2): qty 100

## 2023-12-21 MED ORDER — CEFAZOLIN SODIUM-DEXTROSE 2-4 GM/100ML-% IV SOLN
INTRAVENOUS | Status: AC
  Filled 2023-12-21: qty 100

## 2023-12-21 MED ORDER — PHENYLEPHRINE HCL-NACL 20-0.9 MG/250ML-% IV SOLN
INTRAVENOUS | Status: DC | PRN
  Administered 2023-12-21: 25 ug/min via INTRAVENOUS

## 2023-12-21 MED ORDER — METOCLOPRAMIDE HCL 5 MG PO TABS: 5.0000 mg | ORAL_TABLET | Freq: Three times a day (TID) | ORAL | Status: DC | PRN

## 2023-12-21 MED ORDER — POVIDONE-IODINE 7.5 % EX SOLN
Freq: Once | CUTANEOUS | Status: AC
  Filled 2023-12-21: qty 118

## 2023-12-21 MED ORDER — MENTHOL 3 MG MT LOZG: 1.0000 | LOZENGE | OROMUCOSAL | Status: DC | PRN

## 2023-12-21 MED ORDER — DOCUSATE SODIUM 100 MG PO CAPS
100.0000 mg | ORAL_CAPSULE | Freq: Two times a day (BID) | ORAL | Status: DC
  Administered 2023-12-21 – 2023-12-22 (×2): 100 mg via ORAL
  Filled 2023-12-21 (×2): qty 1

## 2023-12-21 MED ORDER — ASPIRIN 81 MG PO CHEW
81.0000 mg | CHEWABLE_TABLET | Freq: Two times a day (BID) | ORAL | Status: DC
  Administered 2023-12-21 – 2023-12-22 (×2): 81 mg via ORAL
  Filled 2023-12-21 (×2): qty 1

## 2023-12-21 MED ORDER — ROPIVACAINE HCL 5 MG/ML IJ SOLN
INTRAMUSCULAR | Status: DC | PRN
  Administered 2023-12-21: 15 mL via PERINEURAL

## 2023-12-21 MED ORDER — SODIUM CHLORIDE (PF) 0.9 % IJ SOLN
INTRAMUSCULAR | Status: DC | PRN
  Administered 2023-12-21: 60 mL

## 2023-12-21 MED ORDER — METOCLOPRAMIDE HCL 5 MG/ML IJ SOLN: 5.0000 mg | Freq: Three times a day (TID) | INTRAMUSCULAR | Status: DC | PRN

## 2023-12-21 MED ORDER — FENTANYL CITRATE (PF) 100 MCG/2ML IJ SOLN: 100.0000 ug | Freq: Once | INTRAMUSCULAR | Status: AC

## 2023-12-21 MED ORDER — METHOCARBAMOL 1000 MG/10ML IJ SOLN: 500.0000 mg | Freq: Four times a day (QID) | INTRAMUSCULAR | Status: DC | PRN

## 2023-12-21 MED ORDER — CITALOPRAM HYDROBROMIDE 20 MG PO TABS
20.0000 mg | ORAL_TABLET | Freq: Every day | ORAL | Status: DC
  Administered 2023-12-22: 20 mg via ORAL
  Filled 2023-12-21: qty 1

## 2023-12-21 MED ORDER — NAPROXEN 250 MG PO TABS
250.0000 mg | ORAL_TABLET | Freq: Two times a day (BID) | ORAL | Status: DC
Start: 2023-12-22 — End: 2023-12-22
  Administered 2023-12-22: 250 mg via ORAL
  Filled 2023-12-21: qty 1

## 2023-12-21 MED ORDER — DEXAMETHASONE SODIUM PHOSPHATE 10 MG/ML IJ SOLN
INTRAMUSCULAR | Status: AC
  Filled 2023-12-21: qty 1

## 2023-12-21 MED ORDER — PROPOFOL 500 MG/50ML IV EMUL
INTRAVENOUS | Status: DC | PRN
  Administered 2023-12-21: 70 ug/kg/min via INTRAVENOUS

## 2023-12-21 MED ORDER — ACETAMINOPHEN 500 MG PO TABS
1000.0000 mg | ORAL_TABLET | Freq: Four times a day (QID) | ORAL | Status: AC
  Administered 2023-12-21 – 2023-12-22 (×4): 1000 mg via ORAL
  Filled 2023-12-21 (×4): qty 2

## 2023-12-21 MED ORDER — BUPIVACAINE IN DEXTROSE 0.75-8.25 % IT SOLN
INTRATHECAL | Status: DC | PRN
  Administered 2023-12-21: 2 mL via INTRATHECAL

## 2023-12-21 MED ORDER — ACETAMINOPHEN 160 MG/5ML PO SOLN: 325.0000 mg | ORAL | Status: DC | PRN

## 2023-12-21 MED ORDER — MIDAZOLAM HCL 2 MG/2ML IJ SOLN
INTRAMUSCULAR | Status: AC
  Administered 2023-12-21: 2 mg via INTRAVENOUS
  Filled 2023-12-21: qty 2

## 2023-12-21 MED ORDER — ALBUMIN HUMAN 5 % IV SOLN: INTRAVENOUS | Status: DC | PRN

## 2023-12-21 MED ORDER — ONDANSETRON HCL 4 MG/2ML IJ SOLN
INTRAMUSCULAR | Status: DC | PRN
  Administered 2023-12-21: 4 mg via INTRAVENOUS

## 2023-12-21 MED ORDER — ACETAMINOPHEN 325 MG PO TABS: 325.0000 mg | ORAL_TABLET | Freq: Four times a day (QID) | ORAL | Status: DC | PRN

## 2023-12-21 MED ORDER — LACTATED RINGERS IV SOLN: INTRAVENOUS | Status: DC | PRN

## 2023-12-21 MED ORDER — BUPIVACAINE HCL (PF) 0.25 % IJ SOLN
INTRAMUSCULAR | Status: AC
  Filled 2023-12-21: qty 30

## 2023-12-21 MED ORDER — ORAL CARE MOUTH RINSE: 15.0000 mL | Freq: Once | OROMUCOSAL | Status: AC

## 2023-12-21 MED ORDER — FENTANYL CITRATE (PF) 100 MCG/2ML IJ SOLN: 25.0000 ug | INTRAMUSCULAR | Status: DC | PRN

## 2023-12-21 MED ORDER — CEFAZOLIN SODIUM-DEXTROSE 2-4 GM/100ML-% IV SOLN
2.0000 g | INTRAVENOUS | Status: AC
  Administered 2023-12-21: 2 g via INTRAVENOUS

## 2023-12-21 MED ORDER — FENTANYL CITRATE (PF) 100 MCG/2ML IJ SOLN
INTRAMUSCULAR | Status: AC
  Administered 2023-12-21: 100 ug via INTRAVENOUS
  Filled 2023-12-21: qty 2

## 2023-12-21 MED ORDER — PROPOFOL 1000 MG/100ML IV EMUL
INTRAVENOUS | Status: AC
  Filled 2023-12-21: qty 200

## 2023-12-21 MED ORDER — MIDAZOLAM HCL 2 MG/2ML IJ SOLN
INTRAMUSCULAR | Status: AC
  Filled 2023-12-21: qty 2

## 2023-12-21 MED ORDER — GLYCOPYRROLATE PF 0.2 MG/ML IJ SOSY
PREFILLED_SYRINGE | INTRAMUSCULAR | Status: DC | PRN
  Administered 2023-12-21: .1 mg via INTRAVENOUS

## 2023-12-21 MED ORDER — TRANEXAMIC ACID 1000 MG/10ML IV SOLN
INTRAVENOUS | Status: DC | PRN
  Administered 2023-12-21: 2000 mg via TOPICAL

## 2023-12-21 MED ORDER — SODIUM CHLORIDE 0.9 % IR SOLN
Status: DC | PRN
  Administered 2023-12-21: 3000 mL

## 2023-12-21 MED ORDER — BUPIVACAINE-EPINEPHRINE (PF) 0.25% -1:200000 IJ SOLN
INTRAMUSCULAR | Status: AC
  Filled 2023-12-21: qty 30

## 2023-12-21 MED ORDER — POVIDONE-IODINE 10 % EX SWAB
2.0000 | Freq: Once | CUTANEOUS | Status: AC
  Administered 2023-12-21: 2 via TOPICAL

## 2023-12-21 MED ORDER — CHLORHEXIDINE GLUCONATE 0.12 % MT SOLN
15.0000 mL | Freq: Once | OROMUCOSAL | Status: AC
Start: 2023-12-21 — End: 2023-12-21
  Administered 2023-12-21: 15 mL via OROMUCOSAL

## 2023-12-21 SURGICAL SUPPLY — 79 items
BAG COUNTER SPONGE SURGICOUNT (BAG) ×2 IMPLANT
BAG DECANTER FOR FLEXI CONT (MISCELLANEOUS) ×2 IMPLANT
BANDAGE ESMARK 6X9 LF (GAUZE/BANDAGES/DRESSINGS) ×2 IMPLANT
BIT DRILL QUICK REL 1/8 2PK SL (BIT) IMPLANT
BLADE SAG 18X100X1.27 (BLADE) ×2 IMPLANT
BLADE SAW SGTL 13X75X1.27 (BLADE) ×2 IMPLANT
BLADE SAW THK.89X75X18XSGTL (BLADE) ×2 IMPLANT
BNDG COHESIVE 6X5 TAN ST LF (GAUZE/BANDAGES/DRESSINGS) ×2 IMPLANT
BNDG ELASTIC 6X15 VLCR STRL LF (GAUZE/BANDAGES/DRESSINGS) ×2 IMPLANT
BNDG ESMARK 6X9 LF (GAUZE/BANDAGES/DRESSINGS) IMPLANT
BOWL SMART MIX CTS (DISPOSABLE) IMPLANT
CNTNR URN SCR LID CUP LEK RST (MISCELLANEOUS) ×2 IMPLANT
COMP FEM CMT PS STD 11 RT (Joint) ×1 IMPLANT
COMP PATELLA 3 PEG 38 (Joint) ×1 IMPLANT
COMP TIB KNEE PS G 0 RT (Joint) ×1 IMPLANT
COMPONENT FEM CMT PS STD 11 RT (Joint) IMPLANT
COMPONENT PATELLA 3 PEG 38 (Joint) IMPLANT
COMPONENT TIB KNEE PS G 0 RT (Joint) IMPLANT
COOLER ICEMAN CLASSIC (MISCELLANEOUS) IMPLANT
COVER SURGICAL LIGHT HANDLE (MISCELLANEOUS) ×2 IMPLANT
CUFF TOURN SGL QUICK 42 (TOURNIQUET CUFF) IMPLANT
CUFF TRNQT CYL 34X4.125X (TOURNIQUET CUFF) ×2 IMPLANT
DRAPE INCISE IOBAN 66X45 STRL (DRAPES) IMPLANT
DRAPE SURG ORHT 6 SPLT 77X108 (DRAPES) ×6 IMPLANT
DRAPE U-SHAPE 47X51 STRL (DRAPES) ×2 IMPLANT
DRSG AQUACEL AG ADV 3.5X14 (GAUZE/BANDAGES/DRESSINGS) IMPLANT
DURAPREP 26ML APPLICATOR (WOUND CARE) ×4 IMPLANT
ELECT CAUTERY BLADE 6.4 (BLADE) ×2 IMPLANT
ELECT REM PT RETURN 9FT ADLT (ELECTROSURGICAL) ×1 IMPLANT
ELECTRODE REM PT RTRN 9FT ADLT (ELECTROSURGICAL) ×2 IMPLANT
GAUZE SPONGE 4X4 12PLY STRL (GAUZE/BANDAGES/DRESSINGS) ×2 IMPLANT
GLOVE BIOGEL PI IND STRL 7.0 (GLOVE) ×2 IMPLANT
GLOVE BIOGEL PI IND STRL 8 (GLOVE) ×2 IMPLANT
GLOVE ECLIPSE 7.0 STRL STRAW (GLOVE) ×2 IMPLANT
GLOVE ECLIPSE 8.0 STRL XLNG CF (GLOVE) ×2 IMPLANT
GLOVE SURG ENC MOIS LTX SZ6.5 (GLOVE) ×6 IMPLANT
GOWN STRL REUS W/ TWL LRG LVL3 (GOWN DISPOSABLE) ×6 IMPLANT
HOOD PEEL AWAY T7 (MISCELLANEOUS) ×6 IMPLANT
IMMOBILIZER KNEE 20 (SOFTGOODS) IMPLANT
IMMOBILIZER KNEE 20 THIGH 36 (SOFTGOODS) IMPLANT
IMMOBILIZER KNEE 22 UNIV (SOFTGOODS) IMPLANT
IMMOBILIZER KNEE 24 THIGH 36 (MISCELLANEOUS) IMPLANT
IMMOBILIZER KNEE 24 UNIV (MISCELLANEOUS) IMPLANT
INSERT TIBIAL PERSONA 10 RT (Insert) IMPLANT
KIT BASIN OR (CUSTOM PROCEDURE TRAY) ×2 IMPLANT
KIT TURNOVER KIT B (KITS) ×2 IMPLANT
MANIFOLD NEPTUNE II (INSTRUMENTS) ×2 IMPLANT
NDL 22X1.5 STRL (OR ONLY) (MISCELLANEOUS) ×4 IMPLANT
NDL SPNL 18GX3.5 QUINCKE PK (NEEDLE) ×2 IMPLANT
NEEDLE 22X1.5 STRL (OR ONLY) (MISCELLANEOUS) ×2 IMPLANT
NEEDLE SPNL 18GX3.5 QUINCKE PK (NEEDLE) ×1 IMPLANT
NS IRRIG 1000ML POUR BTL (IV SOLUTION) ×4 IMPLANT
PACK TOTAL JOINT (CUSTOM PROCEDURE TRAY) ×2 IMPLANT
PAD ARMBOARD 7.5X6 YLW CONV (MISCELLANEOUS) ×4 IMPLANT
PAD CAST 4YDX4 CTTN HI CHSV (CAST SUPPLIES) ×2 IMPLANT
PAD COLD SHLDR UNI WRAP-ON (PAD) ×1 IMPLANT
PAD COLD UNI WRAP-ON (PAD) IMPLANT
PADDING CAST COTTON 6X4 STRL (CAST SUPPLIES) ×2 IMPLANT
PIN DRILL HDLS TROCAR 75 4PK (PIN) IMPLANT
SCREW FEMALE HEX FIX 25X2.5 (ORTHOPEDIC DISPOSABLE SUPPLIES) IMPLANT
SET HNDPC FAN SPRY TIP SCT (DISPOSABLE) ×2 IMPLANT
SPIKE FLUID TRANSFER (MISCELLANEOUS) ×2 IMPLANT
STRIP CLOSURE SKIN 1/2X4 (GAUZE/BANDAGES/DRESSINGS) ×4 IMPLANT
STRIP CLOSURE SKIN 1/4X4 (GAUZE/BANDAGES/DRESSINGS) IMPLANT
SUCTION TUBE FRAZIER 10FR DISP (SUCTIONS) ×2 IMPLANT
SUT MNCRL AB 3-0 PS2 18 (SUTURE) ×2 IMPLANT
SUT STRATAFIX 1PDS 45CM VIOLET (SUTURE) IMPLANT
SUT VIC AB 0 CT1 27XBRD ANBCTR (SUTURE) ×6 IMPLANT
SUT VIC AB 1 CT1 36 (SUTURE) ×10 IMPLANT
SUT VIC AB 2-0 CT1 TAPERPNT 27 (SUTURE) ×8 IMPLANT
SUT VICRYL 0 27 CT2 27 ABS (SUTURE) ×2 IMPLANT
SYR 30ML LL (SYRINGE) ×6 IMPLANT
SYR TB 1ML LUER SLIP (SYRINGE) ×2 IMPLANT
TIBIAL INSERT PERSONA 10 RT (Insert) ×1 IMPLANT
TOWEL GREEN STERILE (TOWEL DISPOSABLE) ×4 IMPLANT
TOWEL GREEN STERILE FF (TOWEL DISPOSABLE) ×4 IMPLANT
TRAY CATH INTERMITTENT SS 16FR (CATHETERS) IMPLANT
WATER STERILE IRR 1000ML POUR (IV SOLUTION) IMPLANT
YANKAUER SUCT BULB TIP NO VENT (SUCTIONS) ×2 IMPLANT

## 2023-12-21 NOTE — Op Note (Signed)
NAME: Danny [name], Danny [name] MEDICAL RECORD NO: [id number] ACCOUNT NO: [id number] DATE OF BIRTH: [birth year] FACILITY: [facility] LOCATION: [id number] PHYSICIAN: Graylin Shiver. August Saucer, MD  Operative Report   DATE OF PROCEDURE: 12/21/2023  PREOPERATIVE DIAGNOSIS:  Right knee arthritis.  POSTOPERATIVE DIAGNOSIS:  Right knee arthritis.  PROCEDURE:  Right total knee replacement using Persona cruciate retaining right size 11 femur press fit with size G press-fit tibia, 38 mm press-fit patella, 3 peg and 10 mm medial congruent polyethylene.  SURGEON:  Graylin Shiver. August Saucer, MD  ASSISTANT:  Karenann Cai, PA  INDICATIONS:  This is a 53 year old patient with right knee arthritis who presents for operative management after explanation of the risks and benefits.  DESCRIPTION OF PROCEDURE:  The patient was brought to the operating room where spinal anesthetic was induced.  Preoperative antibiotics administer timeout was called.  Right leg was prescrubbed with alcohol and Betadine and allowed to air-dry, prepped  with DuraPrep solution and draped in a sterile manner.  Preop flexion deformity was approximately 15 to 18 degrees.  The patient did have flexion to about 120.  After sterile prepping and draping, a timeout was called.  Ioban used to cover the operative  field.  The leg was elevated and exsanguinated with an Esmarch wrapped.  Tourniquet was inflated.  An anterior approach to the knee was made.  Skin and subcutaneous tissue was sharply divided.  IrriSept solution was utilized.  The median parapatellar  arthrotomy was made and marked with #1 Vicryl suture.  IrriSept solution was utilized here as well.  Patella was everted.  Fat pad partially excised.  Lateral patellofemoral ligament was released.  Soft tissue was removed from the anterior distal femur.   Soft tissue dissection was performed along the medial aspect of the knee proportional to the patient's very mild varus deformity preoperatively.  The ACL was released and the  anterior horn of the lateral meniscus was released.  The retractors were  placed to protect the collateral ligaments and the posterior neurovascular structures.  Intramedullary alignment was then used to make a 7-degree cut perpendicular to the mechanical axis.  This was made setting 10 mm off the least affected lateral tibial  plateau.  Next, intramedullary alignment was used to cut the distal femur in 5 degrees of valgus.  Because of the patient's preop flexion contracture, a 12-mm cut was made.  At this time, a spacer block was placed.  We were able to get a 10 spacer block  in, but still had slight flexion contracture.  2 more millimeters was cut from the proximal tibia.  The femur cut was then cut in 3 degrees of external rotation, which gave symmetric flexion and extension gaps.  The flexion gap was still slightly tight.   4 more millimeters was required of resection in order to equalize the flexion and extension gaps.  The PCL was also released.  Next, bone quality was excellent.  Trial tibia and femur were placed.  Tibia was aligned with the medial third of the tibial  tubercle with the trial components in position.  The patient did achieve about 2 degrees of hyperextension and with 12 mm spacer was still tight in flexion, but a 10 mm spacer allowed for flexion to 90 without lift-off.  Patella was then cut down from 23  to 13 mm and a 3 peg patella trial was placed.  With trial components in position, the patient achieved slight hyperextension, good stability to varus and valgus stress at 0, 30, and 90  degrees.  The patient had good flexion to about 90 to 100 degrees  without lift-off.  Trial components removed and final preparations were made on both the femur and the tibia.  Capsule was anesthetized using Marcaine, saline, and Exparel.  Tourniquet released at this time.  Bleeding points encountered were controlled  using electrocautery.  Pouring irrigation was utilized.  The components were then  press-fit into position.  We did choose a 10-mm spacer, which maintained the same stability parameters.  Thorough irrigation was then performed with 3 liters of pouring  irrigation.  The arthrotomy was closed using #1 Vicryl suture.  We did anesthetize in the knee joint using Marcaine, morphine, and clonidine.  Skin was then closed using 0 Vicryl suture, 2-0 Vicryl suture, and 3-0 Monocryl with Steri-Strips, and Aquacel  dressing applied.  The patient tolerated the procedure well without immediate complications.  He was transferred to the recovery room in stable condition.  Luke's assistance was required at all times for retraction, opening and closing, mobilization of  tissue.  His assistance was of medical necessity.   PUS D: 12/21/2023 4:46:25 pm T: 12/21/2023 7:29:00 pm  JOB: [id number]/ [id number]

## 2023-12-21 NOTE — Anesthesia Preprocedure Evaluation (Addendum)
Anesthesia Evaluation  Patient identified by MRN, date of birth, ID band Patient awake    Reviewed: Allergy & Precautions, H&P , NPO status , Patient's Chart, lab work & pertinent test results  Airway Mallampati: II  TM Distance: >3 FB Neck ROM: full    Dental no notable dental hx.    Pulmonary former smoker   Pulmonary exam normal breath sounds clear to auscultation       Cardiovascular negative cardio ROS Normal cardiovascular exam Rhythm:Regular Rate:Normal     Neuro/Psych  Headaches  Anxiety        GI/Hepatic   Endo/Other    Class 3 obesityobese  Renal/GU      Musculoskeletal   Abdominal   Peds  (+) ADHD Hematology   Anesthesia Other Findings   Reproductive/Obstetrics                             Anesthesia Physical Anesthesia Plan  ASA: 2  Anesthesia Plan: Regional and Spinal   Post-op Pain Management: Regional block*, Minimal or no pain anticipated, Tylenol PO (pre-op)* and Celebrex PO (pre-op)*   Induction: Intravenous  PONV Risk Score and Plan: 1 and Ondansetron, Treatment may vary due to age or medical condition and Propofol infusion  Airway Management Planned: Natural Airway and Simple Face Mask  Additional Equipment: None  Intra-op Plan:   Post-operative Plan: Extubation in OR  Informed Consent: I have reviewed the patients History and Physical, chart, labs and discussed the procedure including the risks, benefits and alternatives for the proposed anesthesia with the patient or authorized representative who has indicated his/her understanding and acceptance.       Plan Discussed with: CRNA, Anesthesiologist and Surgeon  Anesthesia Plan Comments:         Anesthesia Quick Evaluation

## 2023-12-21 NOTE — Anesthesia Procedure Notes (Signed)
Spinal  Patient location during procedure: OR Start time: 12/21/2023 1:00 PM End time: 12/21/2023 1:05 PM Reason for block: surgical anesthesia Staffing Anesthesiologist: Bethena Midget, MD Performed by: Bethena Midget, MD Authorized by: Bethena Midget, MD   Preanesthetic Checklist Completed: patient identified, IV checked, site marked, risks and benefits discussed, surgical consent, monitors and equipment checked, pre-op evaluation and timeout performed Spinal Block Patient position: sitting Prep: DuraPrep Patient monitoring: heart rate, cardiac monitor, continuous pulse ox and blood pressure Approach: midline Location: L3-4 Injection technique: single-shot Needle Needle type: Sprotte  Needle gauge: 24 G Needle length: 9 cm Assessment Sensory level: T4 Events: CSF return

## 2023-12-21 NOTE — Anesthesia Postprocedure Evaluation (Signed)
Anesthesia Post Note  Patient: Danny [name]  Procedure(s) Performed: RIGHT TOTAL KNEE ARTHROPLASTY (Right: Knee)     Patient location during evaluation: PACU Anesthesia Type: Regional, Spinal and MAC Level of consciousness: awake and alert Pain management: pain level controlled Vital Signs Assessment: post-procedure vital signs reviewed and stable Respiratory status: spontaneous breathing, nonlabored ventilation, respiratory function stable and patient connected to nasal cannula oxygen Cardiovascular status: blood pressure returned to baseline and stable Postop Assessment: no apparent nausea or vomiting Anesthetic complications: no   No notable events documented.  Last Vitals:  Vitals:   12/21/23 1809 12/21/23 2030  BP: 119/80 108/68  Pulse: 69   Resp: 18 18  Temp:  36.6 C  SpO2: 99% 96%    Last Pain:  Vitals:   12/21/23 2030  TempSrc: Oral  PainSc: 7                  Keoki Mchargue

## 2023-12-21 NOTE — Brief Op Note (Signed)
   12/21/2023  4:38 PM  PATIENT:  Danny [name]  53 y.o. male  PRE-OPERATIVE DIAGNOSIS:  RIGHT KNEE OSTEOARTHRITIS  POST-OPERATIVE DIAGNOSIS:  RIGHT KNEE OSTEOARTHRITIS  PROCEDURE:  Procedure(s): RIGHT TOTAL KNEE ARTHROPLASTY  SURGEON:  Surgeon(s): Cammy Copa, MD  ASSISTANT: magnant pa  ANESTHESIA:   spinal  EBL: 250 ml    Total I/O In: 700 [IV Piggyback:700] Out: 250 [Blood:250]  BLOOD ADMINISTERED: none  DRAINS: none   LOCAL MEDICATIONS USED:  vanco exparel morphine marcaine clonidine  SPECIMEN:  No Specimen  COUNTS:  YES  TOURNIQUET:   Total Tourniquet Time Documented: Thigh (Right) - 120 minutes Total: Thigh (Right) - 120 minutes   DICTATION: .Other Dictation: Dictation Number [id number]  PLAN OF CARE: Admit for overnight observation  PATIENT DISPOSITION:  PACU - hemodynamically stable

## 2023-12-21 NOTE — Anesthesia Procedure Notes (Addendum)
Anesthesia Regional Block: Adductor canal block   Pre-Anesthetic Checklist: , timeout performed,  Correct Patient, Correct Site, Correct Laterality,  Correct Procedure, Correct Position, site marked,  Risks and benefits discussed,  Surgical consent,  Pre-op evaluation,  At surgeon's request and post-op pain management  Laterality: Right  Prep: chloraprep       Needles:  Injection technique: Single-shot  Needle Type: Echogenic Stimulator Needle     Needle Length: 5cm  Needle Gauge: 22     Additional Needles:   Procedures:,,,, ultrasound used (permanent image in chart),,     Nerve Stimulator or Paresthesia:  Response: quadraceps contraction, 0.45 mA  Additional Responses:   Narrative:  Start time: 12/21/2023 12:15 PM End time: 12/21/2023 12:20 PM Injection made incrementally with aspirations every 5 mL.  Performed by: Personally  Anesthesiologist: Bethena Midget, MD  Additional Notes: Functioning IV was confirmed and monitors were applied.  A 50mm 22ga Arrow echogenic stimulator needle was used. Sterile prep and drape,hand hygiene and sterile gloves were used. Ultrasound guidance: relevant anatomy identified, needle position confirmed, local anesthetic spread visualized around nerve(s)., vascular puncture avoided.  Image printed for medical record. Negative aspiration and negative test dose prior to incremental administration of local anesthetic. The patient tolerated the procedure well.

## 2023-12-21 NOTE — Plan of Care (Signed)

## 2023-12-21 NOTE — H&P (Signed)
TOTAL KNEE ADMISSION H&P  Patient is being admitted for right total knee arthroplasty.  Subjective:  Chief Complaint:right knee pain.  HPI: Danny [name], 53 y.o. male, has a history of pain and functional disability in the right knee due to arthritis and has failed non-surgical conservative treatments for greater than 12 weeks to includeNSAID's and/or analgesics, corticosteriod injections, viscosupplementation injections, flexibility and strengthening excercises, and activity modification.  Onset of symptoms was gradual, starting 5 years ago with gradually worsening course since that time. The patient noted prior procedures on the knee to include  arthroscopy, menisectomy, and ACL reconstruction on the right knee(s).  Patient currently rates pain in the right knee(s) at 8 out of 10 with activity. Patient has night pain, worsening of pain with activity and weight bearing, pain that interferes with activities of daily living, pain with passive range of motion, crepitus, and joint swelling.  Patient has evidence of subchondral sclerosis and joint space narrowing by imaging studies. This patient has had  significant pain with ADLs and no history of dvt or pe. . There is no active infection.  Patient Active Problem List   Diagnosis Date Noted   Attention deficit hyperactivity disorder (ADHD), combined type, moderate 07/11/2022   Daytime somnolence 05/19/2020   Primary osteoarthritis of right knee 05/24/2019   Family history of prostate cancer in father 02/03/2019   Neuropathy 02/06/2017   Hypertriglyceridemia 10/08/2016   Moderate alcohol use disorder (HCC) 07/15/2016   Obesity, Class I, BMI 30.0-34.9 (see actual BMI) 10/12/2015   Health maintenance examination 10/12/2015   OCD (obsessive compulsive disorder) 08/06/2015   Chronic congestion of paranasal sinus 08/06/2015   Mood disorder (HCC)    Past Medical History:  Diagnosis Date   Anxiety and depression    states controlled with meds    Arthritis    generalized   COVID-19 virus infection 09/2019   tested positive   Headache(784.0)    sinus headaches   Impaired glucose tolerance    denies diabetes- states glucose elevated x 1 in past   Multiple allergies    seansonal,environmental   Perianal fistula, posterior midline 02/06/2012   Pilonidal cyst     Past Surgical History:  Procedure Laterality Date   ANAL FISTULECTOMY  03/08/2012   EUA   ANTERIOR CRUCIATE LIGAMENT REPAIR  11/21/2006   right   COLONOSCOPY  12/2021   diverticulosis, int hem, rpt 10 yrs ([facility])   SHOULDER ARTHROSCOPY WITH ROTATOR CUFF REPAIR AND SUBACROMIAL DECOMPRESSION Right 12/10/2013   Procedure: SHOULDER ARTHROSCOPY WITH SUBACROMIAL DECOMPRESSION, DISTAL CALVICLE RESECTION, OPEN ROTATOR CUFF REPAIR.;  Surgeon: Valeria Batman, MD;  Location: [facility];  Service: Orthopedics;  Laterality: Right;   VASECTOMY  11/21/1998   WISDOM TOOTH EXTRACTION     right  lower    Current Facility-Administered Medications  Medication Dose Route Frequency Provider Last Rate Last Admin   ceFAZolin (ANCEF) 2-4 GM/100ML-% IVPB            ceFAZolin (ANCEF) IVPB 2g/100 mL premix  2 g Intravenous On Call to OR Magnant, Charles L, PA-C       fentaNYL (SUBLIMAZE) 100 MCG/2ML injection            midazolam (VERSED) 2 MG/2ML injection            povidone-iodine 10 % swab 2 Application  2 Application Topical Once Magnant, Charles L, PA-C       tranexamic acid (CYKLOKAPRON) 2,000 mg in sodium chloride 0.9 % 50 mL  Topical Application  2,000 mg Topical To OR August Saucer, Corrie Mckusick, MD       tranexamic acid (CYKLOKAPRON) IVPB 1,000 mg  1,000 mg Intravenous To OR Magnant, Charles L, PA-C       No Known Allergies  Social History   Tobacco Use   Smoking status: Former   Smokeless tobacco: Current    Types: Chew  Substance Use Topics   Alcohol use: No    Alcohol/week: 0.0 standard drinks of alcohol    Comment: 12 pk week--Quit Dec 2017    Family  History  Problem Relation Age of Onset   Depression Mother    Prostate cancer Father 45   Colon polyps Father 71   Anxiety disorder Father    Heart disease Maternal Grandfather    Cancer Paternal Grandfather        lung   Colon cancer Neg Hx    Esophageal cancer Neg Hx    Rectal cancer Neg Hx    Stomach cancer Neg Hx      Review of Systems  Musculoskeletal:  Positive for arthralgias.  All other systems reviewed and are negative.   Objective:  Physical Exam Vitals reviewed.  HENT:     Head: Normocephalic.     Nose: Nose normal.     Mouth/Throat:     Mouth: Mucous membranes are moist.  Eyes:     Pupils: Pupils are equal, round, and reactive to light.  Cardiovascular:     Rate and Rhythm: Normal rate.     Pulses: Normal pulses.  Pulmonary:     Effort: Pulmonary effort is normal.  Abdominal:     General: Abdomen is flat.  Musculoskeletal:     Cervical back: Normal range of motion.  Skin:    General: Skin is warm.     Capillary Refill: Capillary refill takes less than 2 seconds.  Neurological:     General: No focal deficit present.     Mental Status: He is alert.  Psychiatric:        Mood and Affect: Mood normal.   Ortho exam demonstrates right knee  range of motion of 15-1 10. Trace effusion. Extensor mechanism intact. No groin pain with internal/external rotation of the leg. Collateral cruciate ligaments are stable. Skin intact. Pedal pulses palpable. Vital signs in last 24 hours: Temp:  [98 F (36.7 C)] 98 F (36.7 C) (01/30 1100) Pulse Rate:  [74] 74 (01/30 1100) Resp:  [11] 11 (01/30 1100) BP: (129)/(81) 129/81 (01/30 1100) SpO2:  [98 %] 98 % (01/30 1100)  Labs:   Estimated body mass index is 33.68 kg/m as calculated from the following:   Height as of 12/15/23: 5\' 10"  (1.778 m).   Weight as of 12/15/23: 106.5 kg.   Imaging Review Plain radiographs demonstrate severe degenerative joint disease of the right knee(s). The overall alignment ismild varus.  The bone quality appears to be good for age and reported activity level.      Assessment/Plan:  End stage arthritis, right knee   The patient history, physical examination, clinical judgment of the provider and imaging studies are consistent with end stage degenerative joint disease of the right knee(s) and total knee arthroplasty is deemed medically necessary. The treatment options including medical management, injection therapy arthroscopy and arthroplasty were discussed at length. The risks and benefits of total knee arthroplasty were presented and reviewed. The risks due to aseptic loosening, infection, stiffness, patella tracking problems, thromboembolic complications and other imponderables were discussed. The patient acknowledged  the explanation, agreed to proceed with the plan and consent was signed. Patient is being admitted for inpatient treatment for surgery, pain control, PT, OT, prophylactic antibiotics, VTE prophylaxis, progressive ambulation and ADL's and discharge planning. The patient is planning to be discharged home with home health services     Patient's anticipated LOS is less than 2 midnights, meeting these requirements: - Younger than 35 - Lives within 1 hour of care - Has a competent adult at home to recover with post-op recover - NO history of  - Chronic pain requiring opiods  - Diabetes  - Coronary Artery Disease  - Heart failure  - Heart attack  - Stroke  - DVT/VTE  - Cardiac arrhythmia  - Respiratory Failure/COPD  - Renal failure  - Anemia  - Advanced Liver disease

## 2023-12-21 NOTE — Progress Notes (Signed)
Orthopedic Tech Progress Note Patient Details:  Danny [name] 02/06/1971 [id number] Applied bone foam. Will put pt in CPM in the morning.  CPM Right Knee CPM Right Knee: On Right Knee Flexion (Degrees): 40 Right Knee Extension (Degrees): 10  Post Interventions Patient Tolerated: Well Instructions Provided: Adjustment of device, Care of device, Poper ambulation with device Ortho Devices Type of Ortho Device: Bone foam zero knee, CPM padding Ortho Device/Splint Location: RLE Ortho Device/Splint Interventions: Ordered, Application, Adjustment   Post Interventions Patient Tolerated: Well Instructions Provided: Adjustment of device, Care of device, Poper ambulation with device  [name] 12/21/2023, 9:40 PM

## 2023-12-21 NOTE — Transfer of Care (Signed)
Immediate Anesthesia Transfer of Care Note  Patient: Danny [name]  Procedure(s) Performed: RIGHT TOTAL KNEE ARTHROPLASTY (Right: Knee)  Patient Location: PACU  Anesthesia Type:Spinal  Level of Consciousness: awake, alert , and oriented  Airway & Oxygen Therapy: Patient Spontanous Breathing  Post-op Assessment: Report given to RN and Post -op Vital signs reviewed and stable  Post vital signs: Reviewed and stable  Last Vitals:  Vitals Value Taken Time  BP 129/94 12/21/23 1700  Temp    Pulse 80 12/21/23 1702  Resp 18 12/21/23 1702  SpO2 96 % 12/21/23 1702  Vitals shown include unfiled device data.  Last Pain:  Vitals:   12/21/23 1100  TempSrc: Oral  PainSc: 4          Complications: No notable events documented.

## 2023-12-22 ENCOUNTER — Encounter (HOSPITAL_COMMUNITY): Payer: Self-pay | Admitting: Orthopedic Surgery

## 2023-12-22 DIAGNOSIS — M1711 Unilateral primary osteoarthritis, right knee: Secondary | ICD-10-CM | POA: Diagnosis not present

## 2023-12-22 MED ORDER — OXYCODONE HCL 5 MG PO TABS: 5.0000 mg | ORAL_TABLET | ORAL | 0 refills | Status: DC | PRN

## 2023-12-22 MED ORDER — NAPROXEN 250 MG PO TABS: 250.0000 mg | ORAL_TABLET | Freq: Two times a day (BID) | ORAL | 0 refills | Status: DC

## 2023-12-22 MED ORDER — METHOCARBAMOL 500 MG PO TABS: 500.0000 mg | ORAL_TABLET | Freq: Four times a day (QID) | ORAL | 0 refills | Status: DC | PRN

## 2023-12-22 MED ORDER — ASPIRIN 81 MG PO CHEW: 81.0000 mg | CHEWABLE_TABLET | Freq: Two times a day (BID) | ORAL | 0 refills | Status: DC

## 2023-12-22 MED ORDER — DOCUSATE SODIUM 100 MG PO CAPS: 100.0000 mg | ORAL_CAPSULE | Freq: Two times a day (BID) | ORAL | 0 refills | Status: DC

## 2023-12-22 MED ORDER — ACETAMINOPHEN 325 MG PO TABS: 325.0000 mg | ORAL_TABLET | Freq: Four times a day (QID) | ORAL | 0 refills | Status: DC | PRN

## 2023-12-22 NOTE — Progress Notes (Signed)
PT is still working with the patient.  When PT has completed therapy, the discharge can move forward.

## 2023-12-22 NOTE — TOC Transition Note (Signed)
Transition of Care [facility]) - Discharge Note   Patient Details  Name: Danny [name] MRN: [id number] Date of Birth: January 16, 1971  [facility] CM/SW Contact:  Epifanio Lesches, RN Phone Number: 12/22/2023, 10:12 AM   Clinical Narrative:    Patient will DC to: home Anticipated DC date: 12/22/2023 Family notified: yes Transport by: car        -  s/p R TKA 1/30 Per MD patient ready for DC today. RN, patient, patient's wife aware of DC. Order noted for home health PT services. Pt agreeable. Pt without provider preference. Referral made with [name] and accepted.  Pt without DME needs. Pt states already has CPM, RW @ home. Wife to assist with care once home. Pt without RX med concerns. Post hospital f/u noted on AVS. Wife to provide transportation to home.  RNCM will sign off for now as intervention is no longer needed. Please consult [place] again if new needs arise.   Final next level of care: Home w Home Health Services Barriers to Discharge: No Barriers Identified   Patient Goals and CMS Choice     Choice offered to / list presented to : Patient      Discharge Placement                       Discharge Plan and Services Additional resources added to the After Visit Summary for     Discharge Planning Services: CM Consult                      HH Arranged: PT [facility] Agency: [facility] Date [facility] Agency Contacted: 12/22/23 Time HH Agency Contacted: 1011 Representative spoke with at [facility] Agency: [name]  Social Drivers of Health (SDOH) Interventions SDOH Screenings   Food Insecurity: No Food Insecurity (12/21/2023)  Housing: Low Risk  (12/21/2023)  Transportation Needs: No Transportation Needs (12/21/2023)  Utilities: Not At Risk (12/21/2023)  Alcohol Screen: Low Risk  (03/17/2023)  Depression (PHQ2-9): Medium Risk (07/14/2023)  Financial Resource Strain: Low Risk  (03/17/2023)  Physical Activity: Unknown (03/17/2023)  Social Connections: Unknown  (03/17/2023)  Stress: No Stress Concern Present (03/17/2023)  Tobacco Use: High Risk (12/21/2023)     Readmission Risk Interventions     No data to display

## 2023-12-22 NOTE — Progress Notes (Signed)
Physical Therapy Treatment Patient Details Name: Danny [name] MRN: [id number] DOB: 1971/10/03 Today's Date: 12/22/2023   History of Present Illness 53 y.o. male [facility]23 for elective R TKA. PMHx: R knee arthroscopy, menisectomy, ACL reconstruction, anxiety/depression, arthritis    PT Comments  Pt on EOB upon arrival and agreeable to PT session. Worked on gait training and stair negotiation in today's session. Pt was able to increase gait distance to ~160 ft with supervision and RW. Pt continues to have decreased WB on R LE due to pain and hesitancy to fully WB. Pt was able to ascend/descend 3 steps, however, pt was unsteady and required MinA to maintain balance. Pt reported having difficulty with stair negotiation prior to surgery. Discussed that pt should use the ramp to enter his home until physical therapy is able to continue working on stair negotiation. Educated pt and wife on HEP and progressive mobility. Pt is progressing towards goals. Acute PT to follow.      If plan is discharge home, recommend the following: A little help with walking and/or transfers;Help with stairs or ramp for entrance;Assist for transportation   Can travel by private vehicle      Yes  Equipment Recommendations  None recommended by PT       Precautions / Restrictions Precautions Precautions: Fall Precaution Comments: pt mentioned MD visited this am and said he did not need the KI. No evidence of knee buckling with ambulation Restrictions Weight Bearing Restrictions Per Provider Order: Yes RLE Weight Bearing Per Provider Order: Weight bearing as tolerated     Mobility  Bed Mobility Overal bed mobility: Modified Independent     General bed mobility comments: seated EOB upon arrival    Transfers Overall transfer level: Needs assistance Equipment used: Rolling walker (2 wheels) Transfers: Sit to/from Stand Sit to Stand: Supervision  General transfer comment: supervision for safety     Ambulation/Gait Ambulation/Gait assistance: Supervision Gait Distance (Feet): 160 Feet Assistive device: Rolling walker (2 wheels) Gait Pattern/deviations: Step-to pattern, Decreased weight shift to right, Knee flexed in stance - right Gait velocity: decr     General Gait Details: decreased WB on R LE 2/2 pain, decreased knee flexion during swing phase.   Stairs Stairs: Yes Stairs assistance: Min assist Stair Management: One rail Right, Step to pattern, Forwards, Backwards Number of Stairs: 3 General stair comments: pt unsteady with stair negotiation with MinA for balance. Cues for sequencing technique. Pt had difficulty placing weight on R LE when lifting L LE for stair ascent    Balance Overall balance assessment: Needs assistance, Mild deficits observed, not formally tested Sitting-balance support: No upper extremity supported, Feet supported Sitting balance-Leahy Scale: Good     Standing balance support: Bilateral upper extremity supported, During functional activity, Reliant on assistive device for balance Standing balance-Leahy Scale: Fair Standing balance comment: able to stand with no UE support     Cognition Arousal: Alert Behavior During Therapy: WFL for tasks assessed/performed Overall Cognitive Status: Within Functional Limits for tasks assessed     Exercises Total Joint Exercises Ankle Circles/Pumps: AROM, Right, 5 reps, Seated Quad Sets: AROM, Right, 5 reps, Supine Heel Slides: AROM, Right, 5 reps, Supine Straight Leg Raises: AROM, Right, 5 reps, Supine Long Arc Quad: AROM, 5 reps, Right, Seated Knee Flexion: AROM, Right, 5 reps, Seated Goniometric ROM: 14-90    General Comments General comments (skin integrity, edema, etc.): VSS on RA      Pertinent Vitals/Pain Pain Assessment Pain Assessment: Faces Faces  Pain Scale: Hurts whole lot Pain Location: R LE Pain Descriptors / Indicators: Aching, Discomfort Pain Intervention(s): Limited activity  within patient's tolerance, Monitored during session, Premedicated before session, Repositioned     PT Goals (current goals can now be found in the care plan section) Acute Rehab PT Goals Patient Stated Goal: to get back to fishing PT Goal Formulation: With patient/family Time For Goal Achievement: 01/05/24 Potential to Achieve Goals: Good Progress towards PT goals: Progressing toward goals    Frequency    Min 1X/week       AM-PAC PT "6 Clicks" Mobility   Outcome Measure  Help needed turning from your back to your side while in a flat bed without using bedrails?: None Help needed moving from lying on your back to sitting on the side of a flat bed without using bedrails?: None Help needed moving to and from a bed to a chair (including a wheelchair)?: A Little Help needed standing up from a chair using your arms (e.g., wheelchair or bedside chair)?: A Little Help needed to walk in hospital room?: A Little Help needed climbing 3-5 steps with a railing? : A Little 6 Click Score: 20    End of Session Equipment Utilized During Treatment: Gait belt Activity Tolerance: Patient tolerated treatment well Patient left: in chair;with call bell/phone within reach;with family/visitor present Nurse Communication: Mobility status PT Visit Diagnosis: Other abnormalities of gait and mobility (R26.89);Muscle weakness (generalized) (M62.81)     Time: 1610-9604 PT Time Calculation (min) (ACUTE ONLY): 16 min  Charges:    $Therapeutic Activity: 8-22 mins PT General Charges $$ ACUTE PT VISIT: 1 Visit                    Hilton Cork, PT, DPT Secure Chat Preferred  Rehab Office [phone]    Arturo Morton Brion Aliment 12/22/2023, 1:11 PM

## 2023-12-22 NOTE — Plan of Care (Signed)
   Problem: Activity: Goal: Risk for activity intolerance will decrease Outcome: Progressing   Problem: Coping: Goal: Level of anxiety will decrease Outcome: Progressing   Problem: Pain Managment: Goal: General experience of comfort will improve and/or be controlled Outcome: Progressing

## 2023-12-22 NOTE — Evaluation (Signed)
Physical Therapy Evaluation Patient Details Name: Danny [name] MRN: [id number] DOB: 12-29-1970 Today's Date: 12/22/2023  History of Present Illness  53 y.o. male presents to [facility] 12/21/23 for elective R TKA. PMHx: R knee arthroscopy, menisectomy, ACL reconstruction, anxiety/depression, arthritis   Clinical Impression  Pt in bed upon arrival with wife present and agreeable to PT eval. Prior to admit, pt was independent with no AD. In today's session, pt was able to perform R LE SLR with no evidence of quad lag. Pt was able to stand and ambulate ~120 ft with supervision and RW. Pt had decreased WB on R LE due to pain. Pt presents to therapy session with decreased R LE strength/ROM, balance, and mobility. Pt would benefit from acute skilled PT to address functional impairments. Acute PT to follow.          If plan is discharge home, recommend the following: A little help with walking and/or transfers;Help with stairs or ramp for entrance;Assist for transportation   Can travel by private vehicle    Yes    Equipment Recommendations None recommended by PT     Functional Status Assessment Patient has had a recent decline in their functional status and demonstrates the ability to make significant improvements in function in a reasonable and predictable amount of time.     Precautions / Restrictions Precautions Precautions: Fall Precaution Comments: pt mentioned MD visited this am and said he did not need the KI. No evidence of knee buckling with ambulation Restrictions Weight Bearing Restrictions Per Provider Order: Yes RLE Weight Bearing Per Provider Order: Weight bearing as tolerated      Mobility  Bed Mobility Overal bed mobility: Modified Independent      General bed mobility comments: HOB elevated    Transfers Overall transfer level: Needs assistance Equipment used: Rolling walker (2 wheels) Transfers: Sit to/from Stand Sit to Stand: Supervision    General transfer  comment: supervision for safety    Ambulation/Gait Ambulation/Gait assistance: Supervision Gait Distance (Feet): 120 Feet Assistive device: Rolling walker (2 wheels) Gait Pattern/deviations: Step-to pattern, Decreased weight shift to right, Knee flexed in stance - right Gait velocity: decr     General Gait Details: decreased WB on R LE 2/2 pain, decreased knee flexion during swing phase. Steady with no LOB    Balance Overall balance assessment: Needs assistance, Mild deficits observed, not formally tested Sitting-balance support: No upper extremity supported, Feet supported Sitting balance-Leahy Scale: Good     Standing balance support: Bilateral upper extremity supported, During functional activity, Reliant on assistive device for balance Standing balance-Leahy Scale: Fair Standing balance comment: able to stand with no UE support       Pertinent Vitals/Pain Pain Assessment Pain Assessment: Faces Faces Pain Scale: Hurts whole lot Pain Location: R LE Pain Descriptors / Indicators: Aching, Discomfort Pain Intervention(s): Limited activity within patient's tolerance, Monitored during session, Repositioned    Home Living Family/patient expects to be discharged to:: Private residence Living Arrangements: Spouse/significant other Available Help at Discharge: Family;Available 24 hours/day Type of Home: House Home Access: Ramped entrance      Home Layout: One level Home Equipment: Pharmacist, hospital (2 wheels)      Prior Function Prior Level of Function : Independent/Modified Independent;Driving;Working/employed      Mobility Comments: ind with no AD ADLs Comments: independent     Extremity/Trunk Assessment   Upper Extremity Assessment Upper Extremity Assessment: Overall WFL for tasks assessed    Lower Extremity Assessment Lower Extremity Assessment: RLE deficits/detail  RLE Deficits / Details: able to SLR with no quad lag, alert to light touch     Cervical / Trunk Assessment Cervical / Trunk Assessment: Normal  Communication   Communication Communication: No apparent difficulties Cueing Techniques: Verbal cues  Cognition Arousal: Alert Behavior During Therapy: WFL for tasks assessed/performed Overall Cognitive Status: Within Functional Limits for tasks assessed       General Comments General comments (skin integrity, edema, etc.): VSS on RA, dressing dry and intact. Educated on total knee exercises and to avoid placing anything underneath R knee    Exercises Total Joint Exercises Ankle Circles/Pumps: AROM, Right, 5 reps, Seated Quad Sets: AROM, Right, 5 reps, Supine Heel Slides: AROM, Right, 5 reps, Supine Straight Leg Raises: AROM, Right, 5 reps, Supine Long Arc Quad: AROM, 5 reps, Right, Seated Knee Flexion: AROM, Right, 5 reps, Seated Goniometric ROM: 14-90   Assessment/Plan    PT Assessment Patient needs continued PT services  PT Problem List Decreased strength;Decreased range of motion;Decreased activity tolerance;Decreased balance;Decreased mobility;Decreased knowledge of use of DME       PT Treatment Interventions DME instruction;Gait training;Stair training;Functional mobility training;Therapeutic activities;Therapeutic exercise;Balance training;Neuromuscular re-education;Patient/family education    PT Goals (Current goals can be found in the Care Plan section)  Acute Rehab PT Goals Patient Stated Goal: to get back to fishing PT Goal Formulation: With patient/family Time For Goal Achievement: 01/05/24 Potential to Achieve Goals: Good    Frequency Min 1X/week        AM-PAC PT "6 Clicks" Mobility  Outcome Measure Help needed turning from your back to your side while in a flat bed without using bedrails?: None Help needed moving from lying on your back to sitting on the side of a flat bed without using bedrails?: None Help needed moving to and from a bed to a chair (including a wheelchair)?: A  Little Help needed standing up from a chair using your arms (e.g., wheelchair or bedside chair)?: A Little Help needed to walk in hospital room?: A Little Help needed climbing 3-5 steps with a railing? : A Little 6 Click Score: 20    End of Session Equipment Utilized During Treatment: Gait belt Activity Tolerance: Patient tolerated treatment well Patient left: in chair;with call bell/phone within reach;with family/visitor present Nurse Communication: Mobility status PT Visit Diagnosis: Other abnormalities of gait and mobility (R26.89);Muscle weakness (generalized) (M62.81)    Time: 7829-5621 PT Time Calculation (min) (ACUTE ONLY): 23 min   Charges:   PT Evaluation $PT Eval Low Complexity: 1 Low PT Treatments $Therapeutic Exercise: 8-22 mins PT General Charges $$ ACUTE PT VISIT: 1 Visit         Hilton Cork, PT, DPT Secure Chat Preferred  Rehab Office [phone]   Arturo Morton Brion Aliment 12/22/2023, 8:57 AM

## 2023-12-22 NOTE — Plan of Care (Signed)
Patient is discharging home with his wife.

## 2023-12-25 ENCOUNTER — Other Ambulatory Visit: Payer: Self-pay | Admitting: Surgical

## 2023-12-25 ENCOUNTER — Other Ambulatory Visit: Payer: Self-pay | Admitting: Family Medicine

## 2023-12-25 ENCOUNTER — Telehealth: Payer: Self-pay | Admitting: Surgical

## 2023-12-25 ENCOUNTER — Other Ambulatory Visit: Payer: Self-pay | Admitting: Orthopedic Surgery

## 2023-12-25 ENCOUNTER — Telehealth: Payer: Self-pay | Admitting: Orthopedic Surgery

## 2023-12-25 MED ORDER — OXYCODONE HCL 5 MG PO TABS: 5.0000 mg | ORAL_TABLET | ORAL | 0 refills | Status: DC | PRN

## 2023-12-25 NOTE — Telephone Encounter (Signed)
Name of Medication:  Oxycodone Name of Pharmacy:  [facility] Last Fill or Written Date and Quantity:  12/22/23, #30 Last Office Visit and Type:  07/14/23, CPE Next Office Visit and Type:  none Last Controlled Substance Agreement Date:  11/30/22 Last UDS:  11/30/22

## 2023-12-25 NOTE — Telephone Encounter (Signed)
Rx refill Oxycodone   [facility] - [place], [place] - [address]

## 2023-12-25 NOTE — Telephone Encounter (Signed)
Patient's wife Tammy called advised patient will run out of his pain medication today. Tammy said the pharmacy closes at 7:00 pm. The number to contact Tammy is [phone]

## 2023-12-25 NOTE — Telephone Encounter (Signed)
 Sent in

## 2023-12-26 ENCOUNTER — Encounter: Payer: Self-pay | Admitting: Family Medicine

## 2023-12-26 NOTE — Telephone Encounter (Signed)
 ERx

## 2023-12-26 NOTE — Telephone Encounter (Signed)
Denied Rx. This needs to come through ortho who just completed surgery for him.  Looks like ortho filled this yesterday.  Replied via mychart.

## 2023-12-27 NOTE — Discharge Summary (Signed)
Physician Discharge Summary      Patient ID: Danny [name] MRN: [id number] DOB/AGE: 1970/12/16 53 y.o.  Admit date: 12/21/2023 Discharge date: 12/22/2023  Admission Diagnoses:  Principal Problem:   S/P total knee replacement, right Active Problems:   Arthritis of right knee   Discharge Diagnoses:  Same  Surgeries: Procedure(s): RIGHT TOTAL KNEE ARTHROPLASTY on 12/21/2023   Consultants:   Discharged Condition: Stable  Hospital Course: Danny [name] is an 53 y.o. male who was admitted 12/21/2023 with a chief complaint of right knee pain, and found to have a diagnosis of right knee arthritis.  They were brought to the operating room on 12/21/2023 and underwent the above named procedures.  Pt awoke from anesthesia without complication and was transferred to the floor. On POD1, patient's pain was overall controlled.  He mobilize well with PT without difficulty significantly.  No red flag signs or symptoms throughout his stay.  Discharged home on POD 1..  Pt will f/u with Dr. August Saucer in clinic in ~2 weeks.   Antibiotics given:  Anti-infectives (From admission, onward)    Start     Dose/Rate Route Frequency Ordered Stop   12/21/23 1900  ceFAZolin (ANCEF) IVPB 2g/100 mL premix        2 g 200 mL/hr over 30 Minutes Intravenous Every 6 hours 12/21/23 1814 12/22/23 0851   12/21/23 1344  vancomycin (VANCOCIN) powder  Status:  Discontinued          As needed 12/21/23 1345 12/21/23 1655   12/21/23 1045  ceFAZolin (ANCEF) IVPB 2g/100 mL premix        2 g 200 mL/hr over 30 Minutes Intravenous On call to O.R. 12/21/23 1031 12/21/23 1321   12/21/23 0953  ceFAZolin (ANCEF) 2-4 GM/100ML-% IVPB       Note to Pharmacy: Susy Manor L: cabinet override      12/21/23 0953 12/21/23 1325     .  Recent vital signs:  Vitals:   12/22/23 0800 12/22/23 1331  BP: 122/68 135/73  Pulse: 90 82  Resp: 18 18  Temp: 98.8 F (37.1 C) 99.1 F (37.3 C)  SpO2: 99% 98%    Recent laboratory studies:   Results for orders placed or performed during the hospital encounter of 12/15/23  CBC   Collection Time: 12/15/23  2:25 PM  Result Value Ref Range   WBC 5.7 4.0 - 10.5 K/uL   RBC 5.07 4.22 - 5.81 MIL/uL   Hemoglobin 15.7 13.0 - 17.0 g/dL   HCT 78.4 69.6 - 29.5 %   MCV 91.1 80.0 - 100.0 fL   MCH 31.0 26.0 - 34.0 pg   MCHC 34.0 30.0 - 36.0 g/dL   RDW 28.4 13.2 - 44.0 %   Platelets 228 150 - 400 K/uL   nRBC 0.0 0.0 - 0.2 %  Basic metabolic panel   Collection Time: 12/15/23  2:25 PM  Result Value Ref Range   Sodium 139 135 - 145 mmol/L   Potassium 3.9 3.5 - 5.1 mmol/L   Chloride 105 98 - 111 mmol/L   CO2 26 22 - 32 mmol/L   Glucose, Bld 96 70 - 99 mg/dL   BUN 16 6 - 20 mg/dL   Creatinine, Ser 1.02 0.61 - 1.24 mg/dL   Calcium 9.1 8.9 - 72.5 mg/dL   GFR, Estimated >36 >64 mL/min   Anion gap 8 5 - 15  Urinalysis, w/ Reflex to Culture (Infection Suspected) -Urine, Clean Catch   Collection Time: 12/15/23  2:25 PM  Result Value Ref Range   Specimen Source URINE, CLEAN CATCH    Color, Urine YELLOW YELLOW   APPearance CLEAR CLEAR   Specific Gravity, Urine 1.020 1.005 - 1.030   pH 7.0 5.0 - 8.0   Glucose, UA NEGATIVE NEGATIVE mg/dL   Hgb urine dipstick NEGATIVE NEGATIVE   Bilirubin Urine NEGATIVE NEGATIVE   Ketones, ur 5 (A) NEGATIVE mg/dL   Protein, ur NEGATIVE NEGATIVE mg/dL   Nitrite NEGATIVE NEGATIVE   Leukocytes,Ua NEGATIVE NEGATIVE   RBC / HPF 0-5 0 - 5 RBC/hpf   WBC, UA 0-5 0 - 5 WBC/hpf   Bacteria, UA NONE SEEN NONE SEEN   Squamous Epithelial / HPF 0-5 0 - 5 /HPF  Surgical pcr screen   Collection Time: 12/15/23  2:26 PM   Specimen: Nasal Mucosa; Nasal Swab  Result Value Ref Range   MRSA, PCR NEGATIVE NEGATIVE   Staphylococcus aureus NEGATIVE NEGATIVE    Discharge Medications:   Allergies as of 12/22/2023   No Known Allergies      Medication List     STOP taking these medications    diclofenac 75 MG EC tablet Commonly known as: VOLTAREN   divalproex 250  MG 24 hr tablet Commonly known as: DEPAKOTE ER   fluticasone 50 MCG/ACT nasal spray Commonly known as: FLONASE   traMADol 50 MG tablet Commonly known as: ULTRAM       TAKE these medications    acetaminophen 325 MG tablet Commonly known as: TYLENOL Take 1-2 tablets (325-650 mg total) by mouth every 6 (six) hours as needed for mild pain (pain score 1-3) (or temp > 100.5).   aspirin 81 MG chewable tablet Chew 1 tablet (81 mg total) by mouth 2 (two) times daily.   citalopram 20 MG tablet Commonly known as: CELEXA TAKE 1 TABLET (20 MG TOTAL) BY MOUTH DAILY.   docusate sodium 100 MG capsule Commonly known as: COLACE Take 1 capsule (100 mg total) by mouth 2 (two) times daily.   lisdexamfetamine 40 MG capsule Commonly known as: Vyvanse Take 1 capsule (40 mg total) by mouth daily before breakfast. What changed: Another medication with the same name was removed. Continue taking this medication, and follow the directions you see here.   methocarbamol 500 MG tablet Commonly known as: ROBAXIN Take 1 tablet (500 mg total) by mouth every 6 (six) hours as needed for muscle spasms.   naproxen 250 MG tablet Commonly known as: NAPROSYN Take 1 tablet (250 mg total) by mouth 2 (two) times daily with a meal.        Diagnostic Studies: No results found.  Disposition: Discharge disposition: 01-Home or Self Care       Discharge Instructions     Call MD / Call 911   Complete by: As directed    If you experience chest pain or shortness of breath, CALL 911 and be transported to the hospital emergency room.  If you develope a fever above 101 F, pus (white drainage) or increased drainage or redness at the wound, or calf pain, call your surgeon's office.   Constipation Prevention   Complete by: As directed    Drink plenty of fluids.  Prune juice may be helpful.  You may use a stool softener, such as Colace (over the counter) 100 mg twice a day.  Use MiraLax (over the counter) for  constipation as needed.   Diet - low sodium heart healthy   Complete by: As directed    Discharge instructions  Complete by: As directed    .  Range of motion and CPM machine 1 hour 3 times a day Okay to shower dressing is waterproof Weightbearing as tolerated with walker Take 1 baby aspirin twice a day for DVT prophylaxis   Increase activity slowly as tolerated   Complete by: As directed    Post-operative opioid taper instructions:   Complete by: As directed    POST-OPERATIVE OPIOID TAPER INSTRUCTIONS: It is important to wean off of your opioid medication as soon as possible. If you do not need pain medication after your surgery it is ok to stop day one. Opioids include: Codeine, Hydrocodone(Norco, Vicodin), Oxycodone(Percocet, oxycontin) and hydromorphone amongst others.  Long term and even short term use of opiods can cause: Increased pain response Dependence Constipation Depression Respiratory depression And more.  Withdrawal symptoms can include Flu like symptoms Nausea, vomiting And more Techniques to manage these symptoms Hydrate well Eat regular healthy meals Stay active Use relaxation techniques(deep breathing, meditating, yoga) Do Not substitute Alcohol to help with tapering If you have been on opioids for Danny than two weeks and do not have pain than it is ok to stop all together.  Plan to wean off of opioids This plan should start within one week post op of your joint replacement. Maintain the same interval or time between taking each dose and first decrease the dose.  Cut the total daily intake of opioids by one tablet each day Next start to increase the time between doses. The last dose that should be eliminated is the evening dose.           Follow-up Information     Eustaquio Boyden, MD Follow up.   Specialty: Family Medicine Contact information: [address] [place] [address] [phone]         [facility] Follow up.   Why: home health services will be provided by [facility], start of care within 48 hours post discharge Contact information: [address] [place] [address] [phone]                  Signed: Karenann Cai 12/27/2023, 11:25 AM

## 2023-12-29 ENCOUNTER — Other Ambulatory Visit: Payer: Self-pay | Admitting: Surgical

## 2023-12-29 ENCOUNTER — Other Ambulatory Visit: Payer: Self-pay | Admitting: Orthopedic Surgery

## 2023-12-29 MED ORDER — OXYCODONE HCL 5 MG PO TABS: 5.0000 mg | ORAL_TABLET | ORAL | 0 refills | Status: DC | PRN

## 2023-12-29 NOTE — Telephone Encounter (Signed)
 sent

## 2023-12-30 ENCOUNTER — Other Ambulatory Visit: Payer: Self-pay | Admitting: Orthopedic Surgery

## 2024-01-01 MED ORDER — OXYCODONE HCL 5 MG PO TABS: 5.0000 mg | ORAL_TABLET | ORAL | 0 refills | Status: DC | PRN

## 2024-01-01 NOTE — Telephone Encounter (Signed)
 Refilled under a different msg

## 2024-01-05 ENCOUNTER — Ambulatory Visit (INDEPENDENT_AMBULATORY_CARE_PROVIDER_SITE_OTHER): Payer: 59 | Admitting: Orthopedic Surgery

## 2024-01-05 ENCOUNTER — Other Ambulatory Visit: Payer: Self-pay | Admitting: Orthopedic Surgery

## 2024-01-05 ENCOUNTER — Other Ambulatory Visit (INDEPENDENT_AMBULATORY_CARE_PROVIDER_SITE_OTHER): Payer: 59

## 2024-01-05 DIAGNOSIS — Z96651 Presence of right artificial knee joint: Secondary | ICD-10-CM

## 2024-01-05 MED ORDER — METHOCARBAMOL 500 MG PO TABS: 500.0000 mg | ORAL_TABLET | Freq: Four times a day (QID) | ORAL | 0 refills | Status: DC | PRN

## 2024-01-05 MED ORDER — OXYCODONE HCL 5 MG PO TABS: 5.0000 mg | ORAL_TABLET | ORAL | 0 refills | Status: DC | PRN

## 2024-01-05 NOTE — Progress Notes (Signed)
 Post-Op Visit Note   Patient: Danny [name]           Date of Birth: 1971/09/13           MRN: [id number] Visit Date: 01/05/2024 PCP: Eustaquio Boyden, MD   Assessment & Plan:  Chief Complaint:  Chief Complaint  Patient presents with   Right Knee - Routine Post Op    12/21/23 right TKA   Visit Diagnoses:  1. S/P total knee arthroplasty, right     Plan: Danny [name] is a patient who is now 2 weeks out right total knee replacement.  He has been doing well.  On examination has between 5 to 10 degrees of flexion contracture but can bend easily to about 90 degrees sitting into 100 otherwise.  He is spending time in the CPM machine.  Incision intact.  No calf tenderness negative Homans.  Continue aspirin.  Oxycodone Robaxin refilled.  Transition to home exercise program but see him back in 2 weeks to make sure he is doing well in terms of extension.  He is doing great with flexion.  Radiographs look good as well.  Follow-Up Instructions: No follow-ups on file.   Orders:  Orders Placed This Encounter  Procedures   XR Knee 1-2 Views Right   No orders of the defined types were placed in this encounter.   Imaging: No results found.  PMFS History: Patient Active Problem List   Diagnosis Date Noted   S/P total knee replacement, right 12/21/2023   Attention deficit hyperactivity disorder (ADHD), combined type, moderate 07/11/2022   Daytime somnolence 05/19/2020   Arthritis of right knee 05/24/2019   Family history of prostate cancer in father 02/03/2019   Neuropathy 02/06/2017   Hypertriglyceridemia 10/08/2016   Moderate alcohol use disorder (HCC) 07/15/2016   Obesity, Class I, BMI 30.0-34.9 (see actual BMI) 10/12/2015   Health maintenance examination 10/12/2015   OCD (obsessive compulsive disorder) 08/06/2015   Chronic congestion of paranasal sinus 08/06/2015   Mood disorder (HCC)    Past Medical History:  Diagnosis Date   Anxiety and depression    states controlled with meds    Arthritis    generalized   COVID-19 virus infection 09/2019   tested positive   Headache(784.0)    sinus headaches   Impaired glucose tolerance    denies diabetes- states glucose elevated x 1 in past   Multiple allergies    seansonal,environmental   Perianal fistula, posterior midline 02/06/2012   Pilonidal cyst     Family History  Problem Relation Age of Onset   Depression Mother    Prostate cancer Father 64   Colon polyps Father 51   Anxiety disorder Father    Heart disease Maternal Grandfather    Cancer Paternal Grandfather        lung   Colon cancer Neg Hx    Esophageal cancer Neg Hx    Rectal cancer Neg Hx    Stomach cancer Neg Hx     Past Surgical History:  Procedure Laterality Date   ANAL FISTULECTOMY  03/08/2012   EUA   ANTERIOR CRUCIATE LIGAMENT REPAIR  11/21/2006   right   COLONOSCOPY  12/2021   diverticulosis, int hem, rpt 10 yrs ([facility])   SHOULDER ARTHROSCOPY WITH ROTATOR CUFF REPAIR AND SUBACROMIAL DECOMPRESSION Right 12/10/2013   Procedure: SHOULDER ARTHROSCOPY WITH SUBACROMIAL DECOMPRESSION, DISTAL CALVICLE RESECTION, OPEN ROTATOR CUFF REPAIR.;  Surgeon: Valeria Batman, MD;  Location: [facility];  Service: Orthopedics;  Laterality: Right;  TOTAL KNEE ARTHROPLASTY Right 12/21/2023   Procedure: RIGHT TOTAL KNEE ARTHROPLASTY;  Surgeon: Cammy Copa, MD;  Location: [facility] OR;  Service: Orthopedics;  Laterality: Right;   VASECTOMY  11/21/1998   WISDOM TOOTH EXTRACTION     right  lower   Social History   Occupational History   Not on file  Tobacco Use   Smoking status: Former   Smokeless tobacco: Current    Types: Chew  Vaping Use   Vaping status: Never Used  Substance and Sexual Activity   Alcohol use: No    Alcohol/week: 0.0 standard drinks of alcohol    Comment: 12 pk week--Quit Dec 2017   Drug use: No   Sexual activity: Not on file

## 2024-01-06 ENCOUNTER — Encounter: Payer: Self-pay | Admitting: Orthopedic Surgery

## 2024-01-10 ENCOUNTER — Ambulatory Visit: Payer: 59 | Admitting: Orthopedic Surgery

## 2024-01-11 ENCOUNTER — Other Ambulatory Visit: Payer: Self-pay | Admitting: Family Medicine

## 2024-01-11 ENCOUNTER — Other Ambulatory Visit: Payer: Self-pay | Admitting: Orthopedic Surgery

## 2024-01-11 DIAGNOSIS — F902 Attention-deficit hyperactivity disorder, combined type: Secondary | ICD-10-CM

## 2024-01-11 MED ORDER — OXYCODONE HCL 5 MG PO TABS: 5.0000 mg | ORAL_TABLET | ORAL | 0 refills | Status: DC | PRN

## 2024-01-11 NOTE — Telephone Encounter (Signed)
 Name of Medication:  Vyvanse Name of Pharmacy:  [facility] Last Fill or Written Date and Quantity:  12/13/23, #30 Last Office Visit and Type:  07/14/23, CPE Next Office Visit and Type:  none Last Controlled Substance Agreement Date:  08/31/23 Last UDS:  11/30/22

## 2024-01-12 ENCOUNTER — Other Ambulatory Visit: Payer: Self-pay

## 2024-01-12 MED ORDER — LISDEXAMFETAMINE DIMESYLATE 40 MG PO CAPS
40.0000 mg | ORAL_CAPSULE | Freq: Every day | ORAL | 0 refills | Status: DC
Start: 2024-01-12 — End: 2024-02-16
  Filled 2024-01-12 – 2024-01-22 (×2): qty 30, 30d supply, fill #0

## 2024-01-12 NOTE — Telephone Encounter (Signed)
 ERx

## 2024-01-15 ENCOUNTER — Telehealth: Payer: Self-pay

## 2024-01-15 NOTE — Telephone Encounter (Signed)
 Copied from CRM [id number]. Topic: Clinical - Prescription Issue >> Jan 15, 2024  3:17 PM Gurney Maxin H wrote: Reason for CRM: Patient is calling stating he needs a hand written prescription for his lisdexamfetamine (VYVANSE) 40 MG capsule, patient states Dr. Reece Agar has previously given him a written prescription for 3 months so that he can take the prescription to any pharmacy that has the medication. Medication shows discontinued in profile, please reach out to patient, thanks.  Justinian [phone]   Script was sent to [facility] pharmacy on 2/21. I have called patient he will see if ready. If any questions he will call back

## 2024-01-16 ENCOUNTER — Other Ambulatory Visit: Payer: Self-pay | Admitting: Orthopedic Surgery

## 2024-01-16 MED ORDER — OXYCODONE HCL 5 MG PO TABS: 5.0000 mg | ORAL_TABLET | Freq: Four times a day (QID) | ORAL | 0 refills | Status: DC | PRN

## 2024-01-16 MED ORDER — METHOCARBAMOL 500 MG PO TABS: 500.0000 mg | ORAL_TABLET | Freq: Four times a day (QID) | ORAL | 0 refills | Status: DC | PRN

## 2024-01-18 ENCOUNTER — Ambulatory Visit: Payer: Self-pay | Admitting: Family Medicine

## 2024-01-18 NOTE — Telephone Encounter (Signed)
 Spoke with pt relaying Dr Timoteo Expose message. Pt verbalizes understanding and agrees to go to UC but wasn't sure which one yet. States he's having minor knee pain and occasional swelling but no redness and/or warm to touch.

## 2024-01-18 NOTE — Telephone Encounter (Addendum)
 Knee replacement surgery was 4 wks ago.  Agree with need for evaluation today for high fever with cough/congestion - if doesn't want ER would at least offer UCC if no appts available today, would ensure not having any new knee symptoms (pain, redness, warmth, swelling).

## 2024-01-18 NOTE — Telephone Encounter (Signed)
  Chief Complaint: Fever s/p knee replacement four weeks ago Symptoms: temporal temperature of 104, cough, congestion Frequency: today Pertinent Negatives: Patient denies CP Disposition: [x] ED /[] Urgent Care (no appt availability in office) / [] Appointment(In office/virtual)/ []  [facility]/ [] Home Care/ [] Refused Recommended Disposition /[] [facility]/ []  Follow-up with PCP Additional Notes: patient's mother called for patient-states patient has a temporal temperature of 104. Patient is 4 weeks post op from knee replacement. Patient endorses cough and congestion as well. Per protocol, patient is recommended to the emergency department. Educated on the importance of patient being checked out with a fever and been four weeks post surgery. Patient's mother verbalized understanding and stated she will call patient's wife and discuss. All questions answered.     Copied from CRM [id number]. Topic: Clinical - Red Word Triage >> Jan 18, 2024 10:53 AM Denese Killings wrote: Red Word that prompted transfer to Nurse Triage: Patient mom stated that Patient had a knee replacement 4 week ago and is now running a fever of 104. Reason for Disposition  Patient sounds very sick or weak to the triager  (Exception: Mild weakness and hasn't taken fever medicine.)  Answer Assessment - Initial Assessment Questions 1. TEMPERATURE: "What is the most recent temperature?"  "How was it measured?"      104-temporal 2. ONSET: "When did the fever start?"      Started today 3. CHILLS: "Do you have chills?" If yes: "How bad are they?"  (e.g., none, mild, moderate, severe)   - NONE: no chills   - MILD: feeling cold   - MODERATE: feeling very cold, some shivering (feels better under a thick blanket)   - SEVERE: feeling extremely cold with shaking chills (general body shaking, rigors; even under a thick blanket)      None 4. OTHER SYMPTOMS: "Do you have any other symptoms besides the fever?"  (e.g., abdomen  pain, cough, diarrhea, earache, headache, sore throat, urination pain)     cough 5. CAUSE: If there are no symptoms, ask: "What do you think is causing the fever?"      Knee replacement 4 weeks ago 6. CONTACTS: "Does anyone else in the family have an infection?"     No 7. TREATMENT: "What have you done so far to treat this fever?" (e.g., medications)     Tylenol 8. IMMUNOCOMPROMISE: "Do you have of the following: diabetes, HIV positive, splenectomy, cancer chemotherapy, chronic steroid treatment, transplant patient, etc."     No 10. TRAVEL: "Have you traveled out of the country in the last month?" (e.g., travel history, exposures)       No  Protocols used: [facility]

## 2024-01-22 ENCOUNTER — Ambulatory Visit (INDEPENDENT_AMBULATORY_CARE_PROVIDER_SITE_OTHER): Payer: 59 | Admitting: Orthopedic Surgery

## 2024-01-22 ENCOUNTER — Encounter: Payer: Self-pay | Admitting: Orthopedic Surgery

## 2024-01-22 ENCOUNTER — Other Ambulatory Visit: Payer: Self-pay | Admitting: Surgical

## 2024-01-22 ENCOUNTER — Other Ambulatory Visit: Payer: Self-pay

## 2024-01-22 DIAGNOSIS — Z96651 Presence of right artificial knee joint: Secondary | ICD-10-CM

## 2024-01-22 MED ORDER — OXYCODONE HCL 5 MG PO TABS: 5.0000 mg | ORAL_TABLET | Freq: Three times a day (TID) | ORAL | 0 refills | Status: DC | PRN

## 2024-01-22 NOTE — Progress Notes (Unsigned)
 Post-Op Visit Note   Patient: Danny [name]           Date of Birth: [birth year]           MRN: [id number] Visit Date: 01/22/2024 PCP: Eustaquio Boyden, MD   Assessment & Plan:  Chief Complaint:  Chief Complaint  Patient presents with   Right Knee - Routine Post Op     12/21/23 right TKA      Visit Diagnoses:  1. S/P total knee arthroplasty, right     Plan: Danny [name] is a 53 year old patient underwent right total knee replacement 12/21/2023.  Patient states he still limping but feeling somewhat better.  Has driven a few times.  Has CPM machine until tomorrow night.  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  Does not really do much in terms of physical work.  On examination he has range of motion of 5 to 90 degrees.  Taking oxycodone for pain.  But cutting back.  Plan at this time is to aim for being off narcotics by 4 weeks from now.  May transition to Norco followed by tramadol over the next several weeks.  Start physical therapy here 3 times a week for 6 weeks to improve flexion.  Follow-Up Instructions: Return in about 4 weeks (around 02/19/2024).   Orders:  No orders of the defined types were placed in this encounter.  No orders of the defined types were placed in this encounter.   Imaging: No results found.  PMFS History: Patient Active Problem List   Diagnosis Date Noted   S/P total knee replacement, right 12/21/2023   Attention deficit hyperactivity disorder (ADHD), combined type, moderate 07/11/2022   Daytime somnolence 05/19/2020   Arthritis of right knee 05/24/2019   Family history of prostate cancer in father 02/03/2019   Neuropathy 02/06/2017   Hypertriglyceridemia 10/08/2016   Moderate alcohol use disorder (HCC) 07/15/2016   Obesity, Class I, BMI 30.0-34.9 (see actual BMI) 10/12/2015   Health maintenance examination 10/12/2015   OCD (obsessive compulsive disorder) 08/06/2015   Chronic congestion of paranasal sinus  08/06/2015   Mood disorder (HCC)    Past Medical History:  Diagnosis Date   Anxiety and depression    states controlled with meds   Arthritis    generalized   COVID-19 virus infection 09/2019   tested positive   Headache(784.0)    sinus headaches   Impaired glucose tolerance    denies diabetes- states glucose elevated x 1 in past   Multiple allergies    seansonal,environmental   Perianal fistula, posterior midline 02/06/2012   Pilonidal cyst     Family History  Problem Relation Age of Onset   Depression Mother    Prostate cancer Father 53   Colon polyps Father 35   Anxiety disorder Father    Heart disease Maternal Grandfather    Cancer Paternal Grandfather        lung   Colon cancer Neg Hx    Esophageal cancer Neg Hx    Rectal cancer Neg Hx    Stomach cancer Neg Hx     Past Surgical History:  Procedure Laterality Date   ANAL FISTULECTOMY  03/08/2012   EUA   ANTERIOR CRUCIATE LIGAMENT REPAIR  11/21/2006   right   COLONOSCOPY  12/2021   diverticulosis, int hem, rpt 10 yrs (Armbruster)   SHOULDER ARTHROSCOPY WITH ROTATOR CUFF REPAIR AND SUBACROMIAL DECOMPRESSION Right 12/10/2013   Procedure: SHOULDER ARTHROSCOPY WITH SUBACROMIAL  DECOMPRESSION, DISTAL CALVICLE RESECTION, OPEN ROTATOR CUFF REPAIR.;  Surgeon: Valeria Batman, MD;  Location: [facility];  Service: Orthopedics;  Laterality: Right;   TOTAL KNEE ARTHROPLASTY Right 12/21/2023   Procedure: RIGHT TOTAL KNEE ARTHROPLASTY;  Surgeon: Cammy Copa, MD;  Location: [facility] OR;  Service: Orthopedics;  Laterality: Right;   VASECTOMY  11/21/1998   WISDOM TOOTH EXTRACTION     right  lower   Social History   Occupational History   Not on file  Tobacco Use   Smoking status: Former   Smokeless tobacco: Current    Types: Chew  Vaping Use   Vaping status: Never Used  Substance and Sexual Activity   Alcohol use: No    Alcohol/week: 0.0 standard drinks of alcohol    Comment: 12 pk week--Quit Dec 2017    Drug use: No   Sexual activity: Not on file

## 2024-01-23 ENCOUNTER — Other Ambulatory Visit: Payer: Self-pay

## 2024-01-23 DIAGNOSIS — Z96651 Presence of right artificial knee joint: Secondary | ICD-10-CM

## 2024-01-29 ENCOUNTER — Other Ambulatory Visit: Payer: Self-pay | Admitting: Surgical

## 2024-01-29 MED ORDER — OXYCODONE HCL 5 MG PO TABS: 5.0000 mg | ORAL_TABLET | Freq: Three times a day (TID) | ORAL | 0 refills | Status: DC | PRN

## 2024-01-31 ENCOUNTER — Ambulatory Visit: Admitting: Rehabilitative and Restorative Service Providers"

## 2024-02-02 ENCOUNTER — Encounter: Admitting: Rehabilitative and Restorative Service Providers"

## 2024-02-02 ENCOUNTER — Telehealth: Payer: Self-pay

## 2024-02-02 NOTE — Telephone Encounter (Signed)
 We need to go with no more pain meds when he finishes this last batch of oxycodone.  Could try some tramadol at that point.  He is going back on Vivitrol which would make withdrawal worse.

## 2024-02-02 NOTE — Telephone Encounter (Signed)
 Megan with [employer] [facility] would like a call back from Dr. August Saucer or Franky Macho.  Cb# [phone].  Please advise.  Thank you

## 2024-02-02 NOTE — Telephone Encounter (Signed)
Dr Dean called 

## 2024-02-05 ENCOUNTER — Encounter: Payer: Self-pay | Admitting: Physical Therapy

## 2024-02-05 ENCOUNTER — Ambulatory Visit (INDEPENDENT_AMBULATORY_CARE_PROVIDER_SITE_OTHER): Admitting: Physical Therapy

## 2024-02-05 DIAGNOSIS — R2689 Other abnormalities of gait and mobility: Secondary | ICD-10-CM | POA: Diagnosis not present

## 2024-02-05 DIAGNOSIS — G8929 Other chronic pain: Secondary | ICD-10-CM

## 2024-02-05 DIAGNOSIS — M25561 Pain in right knee: Secondary | ICD-10-CM | POA: Diagnosis not present

## 2024-02-05 DIAGNOSIS — R600 Localized edema: Secondary | ICD-10-CM

## 2024-02-05 DIAGNOSIS — M25661 Stiffness of right knee, not elsewhere classified: Secondary | ICD-10-CM | POA: Diagnosis not present

## 2024-02-05 NOTE — Therapy (Addendum)
 OUTPATIENT PHYSICAL THERAPY LOWER EXTREMITY EVALUATION   Patient Name: Danny [name] MRN: [id number] DOB:1970/12/23, 53 y.o., male Today's Date: 02/05/2024  END OF SESSION:  PT End of Session - 02/05/24 0932     Visit Number 1    Number of Visits 12    Date for PT Re-Evaluation 04/01/24    Authorization Type [employer]    Progress Note Due on Visit 10    PT Start Time 0854    PT Stop Time 0926    PT Time Calculation (min) 32 min    Activity Tolerance Patient tolerated treatment well;Patient limited by lethargy   took sleep aid and is still feeling effect   Behavior During Therapy [facility] for tasks assessed/performed             Past Medical History:  Diagnosis Date   Anxiety and depression    states controlled with meds   Arthritis    generalized   COVID-19 virus infection 09/2019   tested positive   Headache(784.0)    sinus headaches   Impaired glucose tolerance    denies diabetes- states glucose elevated x 1 in past   Multiple allergies    seansonal,environmental   Perianal fistula, posterior midline 02/06/2012   Pilonidal cyst    Past Surgical History:  Procedure Laterality Date   ANAL FISTULECTOMY  03/08/2012   EUA   ANTERIOR CRUCIATE LIGAMENT REPAIR  11/21/2006   right   COLONOSCOPY  12/2021   diverticulosis, int hem, rpt 10 yrs (Armbruster)   SHOULDER ARTHROSCOPY WITH ROTATOR CUFF REPAIR AND SUBACROMIAL DECOMPRESSION Right 12/10/2013   Procedure: SHOULDER ARTHROSCOPY WITH SUBACROMIAL DECOMPRESSION, DISTAL CALVICLE RESECTION, OPEN ROTATOR CUFF REPAIR.;  Surgeon: Valeria Batman, MD;  Location: [facility];  Service: Orthopedics;  Laterality: Right;   TOTAL KNEE ARTHROPLASTY Right 12/21/2023   Procedure: RIGHT TOTAL KNEE ARTHROPLASTY;  Surgeon: Cammy Copa, MD;  Location: [facility] OR;  Service: Orthopedics;  Laterality: Right;   VASECTOMY  11/21/1998   WISDOM TOOTH EXTRACTION     right  lower   Patient Active Problem List    Diagnosis Date Noted   S/P total knee replacement, right 12/21/2023   Attention deficit hyperactivity disorder (ADHD), combined type, moderate 07/11/2022   Daytime somnolence 05/19/2020   Arthritis of right knee 05/24/2019   Family history of prostate cancer in father 02/03/2019   Neuropathy 02/06/2017   Hypertriglyceridemia 10/08/2016   Moderate alcohol use disorder (HCC) 07/15/2016   Obesity, Class I, BMI 30.0-34.9 (see actual BMI) 10/12/2015   Health maintenance examination 10/12/2015   OCD (obsessive compulsive disorder) 08/06/2015   Chronic congestion of paranasal sinus 08/06/2015   Mood disorder (HCC)     PCP: Eustaquio Boyden, MD   REFERRING PROVIDER: Cammy Copa, MD   REFERRING DIAG: [phone] (ICD-10-CM) - S/P total knee arthroplasty, right   THERAPY DIAG:  Chronic pain of right knee  Localized edema  Other abnormalities of gait and mobility  Stiffness of right knee, not elsewhere classified  Rationale for Evaluation and Treatment: Rehabilitation  ONSET DATE: 12/21/2023 R TKA  SUBJECTIVE:   SUBJECTIVE STATEMENT: S/p R TKA c/o pain and limping. Had been using CPM machine up until last week. Went back to work last week but still not driving since he is still taking pain meds. Had to take ice with him to work as the pain and swelling started to increase. Patient states he took sleep aid and is still feeling sleepy from it, at times  closing eyes during conversation. Trying to cut back on oxycodone use and is asking what he could take in substitution.   PERTINENT HISTORY: Neuropathy, anxiety and depression, R RTC repair (2015), daytime somnolence, hypertriglyceridemia  PAIN:  NPRS scale: 4/10 currently, 6-7/10 at worst Pain location: Rt knee sometimes all the way to foot Pain description: achy "tooth ache Aggravating factors: sitting too long, walking Relieving factors: ice, meds, elevation  PRECAUTIONS: None  WEIGHT BEARING RESTRICTIONS: No  FALLS:   Has patient fallen in last 6 months? Yes. 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   PLOF: Independent  PATIENT GOALS: "getting knee back better than it was before the surgery"  Next MD visit: 02/19/2024 Dr. August Saucer   OBJECTIVE:   DIAGNOSTIC FINDINGS:  AP lateral radiographs right knee reviewed.  Total knee prosthesis in good  position alignment with no complicating features.   PATIENT SURVEYS:  Patient-Specific Activity Scoring Scheme  "0" represents "unable to perform." "10" represents "able to perform at prior level. 0 1 2 3 4 5 6 7 8 9  10 (Date and Score)   Activity Eval     1. Hunting  5    2. Golfing   5    3. Fishing  5   4.    5.    Score 15/30    Total score = sum of the activity scores/number of activities Minimum detectable change (90%CI) for average score = 2 points Minimum detectable change (90%CI) for single activity score = 3 points  COGNITION: Overall cognitive status: WFL    SENSATION: WFL  EDEMA:  Not measured  MUSCLE LENGTH: Not tested  POSTURE:  rounded shoulders  PALPATION: Denies tenderness  LOWER EXTREMITY ROM:   ROM Right eval Left eval  Hip flexion    Hip extension    Hip abduction    Hip adduction    Hip internal rotation    Hip external rotation    Knee flexion  Seated: A: 80 (attempts to hike hip for more ROM) Supine: A: 94 P: 98   Knee extension Missing 6 deg in seated LAQ   Ankle dorsiflexion    Ankle plantarflexion    Ankle inversion    Ankle eversion     (Blank rows = not tested)  LOWER EXTREMITY MMT:  MMT Right eval Left eval  Hip flexion 5 5  Hip extension     Hip abduction    Hip adduction    Hip internal rotation    Hip external rotation    Knee flexion 5 5  Knee extension 4- 5  Ankle dorsiflexion 4 5  Ankle plantarflexion    Ankle inversion    Ankle eversion     (Blank rows = not tested)  LOWER EXTREMITY SPECIAL TESTS:  None tested  FUNCTIONAL TESTS:  30STS: 9 reps with RUE staggered in front of L to decrease amount of knee flexion, heavy sway to L side during rise and lower from chair, no UE use  GAIT: Distance walked: clinical distances Assistive device utilized: None Level of assistance: Modified independence Comments: antalgic. Decreased stance time on RLE, decreased knee flexion of RLE throughout gait cycle.  TODAY'S TREATMENT                                                                          DATE: 3/17/025 Therex:    HEP instruction/performance c cues for techniques, handout provided.  Trial set performed of each for comprehension and symptom assessment.  See below for exercise list  PATIENT EDUCATION:  Education details: HEP, POC Person educated: Patient Education method: Explanation, Demonstration, Verbal cues, and Handouts Education comprehension: verbalized understanding, returned demonstration, and verbal cues required  HOME EXERCISE PROGRAM: Access Code: [email or web address] URL: [email or web address] Date: 02/05/2024 Prepared by: Ivery Quale  Exercises - seated knee flexion stretch with knee extension strengthening  - 3 x daily - 6 x weekly - 1 sets - 10 reps - 5 sec hold - Seated Long Arc Quad  - 3 x daily - 6 x weekly - 1 sets - 10 reps - Sit to Stand Without Arm Support  - 3 x daily - 6 x weekly - 3 sets - 5 reps - Seated Straight Leg Raise with Quad Contraction  - 3 x daily - 6 x weekly - 1 sets - 10 reps - Supine Heel Slide with  Strap  - 3 x daily - 6 x weekly - 1 sets - 10 reps - 5 hold  ASSESSMENT:  CLINICAL IMPRESSION: Patient is a 53 y.o. who comes to clinic with complaints of Rt knee pain with mobility, strength and movement coordination deficits that impair their ability to perform usual daily and recreational functional activities without increase difficulty/symptoms at this time.  Patient to benefit from skilled PT services to address impairments and limitations to improve to previous level of function without restriction secondary to condition.   OBJECTIVE IMPAIRMENTS: Abnormal gait, decreased activity tolerance, decreased balance, decreased coordination, decreased endurance, decreased mobility, difficulty walking, decreased ROM, decreased strength, increased edema, increased fascial restrictions, and pain.   ACTIVITY LIMITATIONS: carrying, lifting, bending, sitting, standing, squatting, sleeping, stairs, and locomotion level  PARTICIPATION LIMITATIONS: meal prep, cleaning, laundry, driving, shopping, community activity, occupation, and yard work  PERSONAL FACTORS: Fitness, Profession, Time since onset of injury/illness/exacerbation, and 3+ comorbidities: see above  are also affecting patient's functional outcome.   REHAB POTENTIAL: Good  CLINICAL DECISION MAKING: Stable/uncomplicated  EVALUATION COMPLEXITY: Low   GOALS: Goals reviewed with patient? Yes  SHORT TERM GOALS: (target date for Short term goals are 3 weeks 02/26/2024)   1.  Patient will demonstrate independent use of home exercise program to maintain progress from in clinic treatments.  Goal status: New  LONG TERM GOALS: (target dates for all long term goals are 8 weeks  04/01/2024 )   1. Patient will demonstrate/report pain at worst less than or equal to 2/10 to facilitate minimal limitation in daily activity secondary to pain symptoms.  Goal status: New   2. Patient will demonstrate independent use of home exercise program to  facilitate ability to maintain/progress functional gains from skilled physical therapy services.  Goal status: New   3. Patient will demonstrate Patient specific functional scale avg > or = 8/10 to indicate reduced disability due to condition.   Goal status: New   4.  Patient will demonstrate RLE MMT 5/5 throughout to faciltiate usual transfers,  stairs, squatting at PLOF for daily life.   Goal status: New   5.  Patient will demonstrate proper form with STS with even weight distribution without RLE staggered in front  Goal status: New    6.  Patient will demonstrate up and down a flight of stairs with single hand rail with reciprocal gait pattern.   Goal Status: New   PLAN:  PT FREQUENCY: 1-3x/week  PT DURATION: 8 weeks  PLANNED INTERVENTIONS: Can include 16109- PT Re-evaluation, 97110-Therapeutic exercises, 97530- Therapeutic activity, 97112- Neuromuscular re-education, 97535- Self Care, 97140- Manual therapy, [phone]- Gait training, [phone]- Orthotic Fit/training, [phone]- Canalith repositioning, [id number]- Aquatic Therapy, [phone]- Electrical stimulation (unattended), [phone]- Electrical stimulation (manual), [id number] Physical performance testing, 97016- Vasopneumatic device, [id number]- Ultrasound, [id number]- Traction (mechanical), [id number]- Ionotophoresis 4mg /ml Dexamethasone, Patient/Family education, Balance training, Stair training, Taping, Dry Needling, Joint mobilization, Joint manipulation, Spinal manipulation, Spinal mobilization, Scar mobilization, Vestibular training, Visual/preceptual remediation/compensation, DME instructions, Cryotherapy, and Moist heat.  All performed as medically necessary.  All included unless contraindicated  PLAN FOR NEXT SESSION: Review HEP knowledge/results. Continue flexion ROM and quad strengthening.   Aloise Copus, Arbor Cohen, Student-PT 02/05/2024, 10:06 AM  During this treatment session, this physical therapist was present, participating in and directing the treatment.    This note has been reviewed and this clinician agrees with the information provided.   Ivery Quale, PT, DPT 02/05/24 10:14 AM

## 2024-02-15 ENCOUNTER — Other Ambulatory Visit: Payer: Self-pay | Admitting: Family Medicine

## 2024-02-15 ENCOUNTER — Other Ambulatory Visit: Payer: Self-pay

## 2024-02-15 DIAGNOSIS — F902 Attention-deficit hyperactivity disorder, combined type: Secondary | ICD-10-CM

## 2024-02-15 NOTE — Telephone Encounter (Signed)
 Name of Medication:  Vyvanse Name of Pharmacy:  [facility] Last Fill or Written Date and Quantity:  01/22/24, #30 Last Office Visit and Type:  07/14/23, CPE Next Office Visit and Type:  none Last Controlled Substance Agreement Date:  08/31/23 Last UDS:  11/30/22

## 2024-02-15 NOTE — Telephone Encounter (Signed)
 Duplicate request (see other 02/15/24 refill note).

## 2024-02-16 ENCOUNTER — Other Ambulatory Visit: Payer: Self-pay

## 2024-02-16 MED FILL — Lisdexamfetamine Dimesylate Cap 40 MG: ORAL | 30 days supply | Qty: 30 | Fill #0 | Status: AC

## 2024-02-16 NOTE — Telephone Encounter (Signed)
 ERx

## 2024-02-18 ENCOUNTER — Other Ambulatory Visit: Payer: Self-pay

## 2024-02-19 ENCOUNTER — Ambulatory Visit (INDEPENDENT_AMBULATORY_CARE_PROVIDER_SITE_OTHER): Admitting: Orthopedic Surgery

## 2024-02-19 ENCOUNTER — Encounter: Admitting: Physical Therapy

## 2024-02-19 ENCOUNTER — Other Ambulatory Visit: Payer: Self-pay

## 2024-02-19 ENCOUNTER — Encounter: Payer: Self-pay | Admitting: Orthopedic Surgery

## 2024-02-19 DIAGNOSIS — Z96651 Presence of right artificial knee joint: Secondary | ICD-10-CM

## 2024-02-19 NOTE — Progress Notes (Unsigned)
 Post-Op Visit Note   Patient: Danny [name]           Date of Birth: [birth year]           MRN: [id number] Visit Date: 02/19/2024 PCP: Eustaquio Boyden, MD   Assessment & Plan:  Chief Complaint:  Chief Complaint  Patient presents with   Right Knee - Routine Post Op    right total knee replacement 12/21/2023   Visit Diagnoses:  1. S/P total knee arthroplasty, right     Plan: [name] is now 8 weeks out right total knee replacement.  Does report some continued pain and stiffness.  Physical therapy has not really yet started.  On exam his range of motion is 5-100.  Taking Aleve and Tylenol.  Does not do physical work.  He was able to go fishing and was [place] a boat in [place].  He does use a brace at times.  Plan at this time is to really focus on flexion.  Overall he is has diminished in the knee but still present to some degree.  I think he should work on patellar mobilization and we will aim for flexion to 110 on his final clinical recheck in 6 weeks.  Follow-Up Instructions: Return in about 6 weeks (around 04/01/2024).   Orders:  No orders of the defined types were placed in this encounter.  No orders of the defined types were placed in this encounter.   Imaging: No results found.  PMFS History: Patient Active Problem List   Diagnosis Date Noted   S/P total knee replacement, right 12/21/2023   Attention deficit hyperactivity disorder (ADHD), combined type, moderate 07/11/2022   Daytime somnolence 05/19/2020   Arthritis of right knee 05/24/2019   Family history of prostate cancer in father 02/03/2019   Neuropathy 02/06/2017   Hypertriglyceridemia 10/08/2016   Moderate alcohol use disorder (HCC) 07/15/2016   Obesity, Class I, BMI 30.0-34.9 (see actual BMI) 10/12/2015   Health maintenance examination 10/12/2015   OCD (obsessive compulsive disorder) 08/06/2015   Chronic congestion of paranasal sinus 08/06/2015   Mood disorder (HCC)    Past Medical History:  Diagnosis Date    Anxiety and depression    states controlled with meds   Arthritis    generalized   COVID-19 virus infection 09/2019   tested positive   Headache(784.0)    sinus headaches   Impaired glucose tolerance    denies diabetes- states glucose elevated x 1 in past   Multiple allergies    seansonal,environmental   Perianal fistula, posterior midline 02/06/2012   Pilonidal cyst     Family History  Problem Relation Age of Onset   Depression Mother    Prostate cancer Father 63   Colon polyps Father 31   Anxiety disorder Father    Heart disease Maternal Grandfather    Cancer Paternal Grandfather        lung   Colon cancer Neg Hx    Esophageal cancer Neg Hx    Rectal cancer Neg Hx    Stomach cancer Neg Hx     Past Surgical History:  Procedure Laterality Date   ANAL FISTULECTOMY  03/08/2012   EUA   ANTERIOR CRUCIATE LIGAMENT REPAIR  11/21/2006   right   COLONOSCOPY  12/2021   diverticulosis, int hem, rpt 10 yrs (Armbruster)   SHOULDER ARTHROSCOPY WITH ROTATOR CUFF REPAIR AND SUBACROMIAL DECOMPRESSION Right 12/10/2013   Procedure: SHOULDER ARTHROSCOPY WITH SUBACROMIAL DECOMPRESSION, DISTAL CALVICLE RESECTION, OPEN ROTATOR CUFF REPAIR.;  Surgeon: Theron Arista  Kathi Der, MD;  Location: [facility];  Service: Orthopedics;  Laterality: Right;   TOTAL KNEE ARTHROPLASTY Right 12/21/2023   Procedure: RIGHT TOTAL KNEE ARTHROPLASTY;  Surgeon: Cammy Copa, MD;  Location: [facility] OR;  Service: Orthopedics;  Laterality: Right;   VASECTOMY  11/21/1998   WISDOM TOOTH EXTRACTION     right  lower   Social History   Occupational History   Not on file  Tobacco Use   Smoking status: Former   Smokeless tobacco: Current    Types: Chew  Vaping Use   Vaping status: Never Used  Substance and Sexual Activity   Alcohol use: No    Alcohol/week: 0.0 standard drinks of alcohol    Comment: 12 pk week--Quit Dec 2017   Drug use: No   Sexual activity: Not on file

## 2024-02-21 ENCOUNTER — Encounter: Admitting: Physical Therapy

## 2024-02-26 ENCOUNTER — Encounter: Admitting: Physical Therapy

## 2024-02-28 ENCOUNTER — Encounter: Admitting: Physical Therapy

## 2024-03-04 ENCOUNTER — Encounter: Admitting: Physical Therapy

## 2024-03-06 ENCOUNTER — Encounter: Admitting: Physical Therapy

## 2024-03-13 ENCOUNTER — Other Ambulatory Visit: Payer: Self-pay

## 2024-03-19 ENCOUNTER — Other Ambulatory Visit: Payer: Self-pay | Admitting: Family Medicine

## 2024-03-19 DIAGNOSIS — F902 Attention-deficit hyperactivity disorder, combined type: Secondary | ICD-10-CM

## 2024-03-20 ENCOUNTER — Telehealth: Payer: Self-pay | Admitting: Family Medicine

## 2024-03-20 ENCOUNTER — Other Ambulatory Visit: Payer: Self-pay

## 2024-03-20 MED ORDER — LISDEXAMFETAMINE DIMESYLATE 40 MG PO CAPS
40.0000 mg | ORAL_CAPSULE | Freq: Every day | ORAL | 0 refills | Status: DC
  Filled 2024-03-20: qty 30, 30d supply, fill #0

## 2024-03-20 NOTE — Telephone Encounter (Signed)
 Pt called requesting an update on his Vyvanse  40mg  refill. RN called the pharmacy. Pharmacy states they are processing the refill currently. RN called the pt back to give him an update. Pt's VMB is full and this RN was unable to leave a voicemail.

## 2024-03-20 NOTE — Telephone Encounter (Signed)
 ERx

## 2024-03-20 NOTE — Telephone Encounter (Signed)
 Name of Medication:  Vyvanse  Name of Pharmacy:  [facility] Last Fill or Written Date and Quantity:  02/19/24, #30 Last Office Visit and Type:  07/14/23, CPE Next Office Visit and Type:  none Last Controlled Substance Agreement Date:  08/31/23 Last UDS:  11/30/22

## 2024-03-20 NOTE — Telephone Encounter (Signed)
 Copied from CRM [id number]. Topic: Clinical - Medication Refill >> Mar 20, 2024  2:00 PM Alyse July wrote: Most Recent Primary Care Visit:  Provider: Claire Crick  Department: [facility]  Visit Type: PHYSICAL  Date: 07/14/2023  Medication: lisdexamfetamine (VYVANSE ) 40 MG capsule  Has the patient contacted their pharmacy? Yes (Agent: If no, request that the patient contact the pharmacy for the refill. If patient does not wish to contact the pharmacy document the reason why and proceed with request.) (Agent: If yes, when and what did the pharmacy advise?)  Is this the correct pharmacy for this prescription? Yes If no, delete pharmacy and type the correct one.  This is the patient's preferred pharmacy:   [facility] [address] [place] [address] Phone: [phone] Fax: [phone]   Has the prescription been filled recently? No  Is the patient out of the medication? Yes  Has the patient been seen for an appointment in the last year OR does the patient have an upcoming appointment? Yes  Can we respond through MyChart? Yes  Agent: Please be advised that Rx refills may take up to 3 business days. We ask that you follow-up with your pharmacy.

## 2024-04-19 ENCOUNTER — Other Ambulatory Visit: Payer: Self-pay | Admitting: Family Medicine

## 2024-04-19 ENCOUNTER — Other Ambulatory Visit: Payer: Self-pay

## 2024-04-19 DIAGNOSIS — F902 Attention-deficit hyperactivity disorder, combined type: Secondary | ICD-10-CM

## 2024-04-19 NOTE — Telephone Encounter (Addendum)
 Name of Medication:  Vyvanse  Name of Pharmacy:  [facility] Last Fill or Written Date and Quantity:  03/21/24, #30 Last Office Visit and Type:  07/14/23, CPE Next Office Visit and Type:  none Last Controlled Substance Agreement Date:  11/30/22 Last UDS:  11/30/22

## 2024-04-22 ENCOUNTER — Other Ambulatory Visit: Payer: Self-pay

## 2024-04-22 MED FILL — Lisdexamfetamine Dimesylate Cap 40 MG: ORAL | 30 days supply | Qty: 30 | Fill #0 | Status: CN

## 2024-04-22 NOTE — Telephone Encounter (Signed)
 Patient is calling in regarding his medication he was needing for the dr to approve the refill his wife is in the area and was [place] go pick the medication up if the dr can go ahead and approve it

## 2024-04-22 NOTE — Telephone Encounter (Signed)
 Duplicate request (see 04/19/24 refill note).

## 2024-04-22 NOTE — Telephone Encounter (Signed)
 ERx

## 2024-04-23 ENCOUNTER — Other Ambulatory Visit: Payer: Self-pay

## 2024-04-23 ENCOUNTER — Ambulatory Visit (INDEPENDENT_AMBULATORY_CARE_PROVIDER_SITE_OTHER): Admitting: Internal Medicine

## 2024-04-23 ENCOUNTER — Other Ambulatory Visit (HOSPITAL_COMMUNITY): Payer: Self-pay

## 2024-04-23 ENCOUNTER — Ambulatory Visit: Admitting: Family Medicine

## 2024-04-23 ENCOUNTER — Encounter: Payer: Self-pay | Admitting: Internal Medicine

## 2024-04-23 VITALS — BP 136/78 | HR 76 | Temp 98.1°F | Ht 69.69 in | Wt 229.1 lb

## 2024-04-23 DIAGNOSIS — F1020 Alcohol dependence, uncomplicated: Secondary | ICD-10-CM | POA: Diagnosis not present

## 2024-04-23 DIAGNOSIS — F902 Attention-deficit hyperactivity disorder, combined type: Secondary | ICD-10-CM

## 2024-04-23 DIAGNOSIS — F39 Unspecified mood [affective] disorder: Secondary | ICD-10-CM

## 2024-04-23 MED ORDER — CITALOPRAM HYDROBROMIDE 20 MG PO TABS: 20.0000 mg | ORAL_TABLET | Freq: Every day | ORAL | 1 refills | Status: DC

## 2024-04-23 MED ORDER — LISDEXAMFETAMINE DIMESYLATE 60 MG PO CAPS
60.0000 mg | ORAL_CAPSULE | ORAL | 0 refills | Status: DC
  Filled 2024-04-23: qty 30, 30d supply, fill #0

## 2024-04-23 NOTE — Assessment & Plan Note (Signed)
 Satisfied with vyvanse --but wears off Will increase dose to 60mg  dailly

## 2024-04-23 NOTE — Assessment & Plan Note (Signed)
 Doing well on citalopram  20 Will refill

## 2024-04-23 NOTE — Progress Notes (Signed)
 Subjective:    Patient ID: Danny [name], male    DOB: 06/01/1971, 53 y.o.   MRN: [id number]  HPI Here for review of medications   Is doing okay He notes that the vyvanse  tends to wear off later in the day Still gets naltrexone  injections from psychiatrist Has been abstinent from alcohol No longer on topamax  No depressed mood on the citalopram  Not generally anxious--did have symptoms from the alcohol though  Current Outpatient Medications on File Prior to Visit  Medication Sig Dispense Refill   citalopram  (CELEXA ) 20 MG tablet TAKE 1 TABLET (20 MG TOTAL) BY MOUTH DAILY. 90 tablet 2   lisdexamfetamine (VYVANSE ) 40 MG capsule Take 1 capsule (40 mg total) by mouth daily before breakfast. 30 capsule 0   No current facility-administered medications on file prior to visit.    No Known Allergies  Past Medical History:  Diagnosis Date   Anxiety and depression    states controlled with meds   Arthritis    generalized   COVID-19 virus infection 09/2019   tested positive   Headache(784.0)    sinus headaches   Impaired glucose tolerance    denies diabetes- states glucose elevated x 1 in past   Multiple allergies    seansonal,environmental   Perianal fistula, posterior midline 02/06/2012   Pilonidal cyst     Past Surgical History:  Procedure Laterality Date   ANAL FISTULECTOMY  03/08/2012   EUA   ANTERIOR CRUCIATE LIGAMENT REPAIR  11/21/2006   right   COLONOSCOPY  12/2021   diverticulosis, int hem, rpt 10 yrs ([facility])   SHOULDER ARTHROSCOPY WITH ROTATOR CUFF REPAIR AND SUBACROMIAL DECOMPRESSION Right 12/10/2013   Procedure: SHOULDER ARTHROSCOPY WITH SUBACROMIAL DECOMPRESSION, DISTAL CALVICLE RESECTION, OPEN ROTATOR CUFF REPAIR.;  Surgeon: Shirlee Dotter, MD;  Location: [facility];  Service: Orthopedics;  Laterality: Right;   TOTAL KNEE ARTHROPLASTY Right 12/21/2023   Procedure: RIGHT TOTAL KNEE ARTHROPLASTY;  Surgeon: Jasmine Mesi, MD;   Location: [facility] OR;  Service: Orthopedics;  Laterality: Right;   VASECTOMY  11/21/1998   WISDOM TOOTH EXTRACTION     right  lower    Family History  Problem Relation Age of Onset   Depression Mother    Prostate cancer Father 13   Colon polyps Father 92   Anxiety disorder Father    Heart disease Maternal Grandfather    Cancer Paternal Grandfather        lung   Colon cancer Neg Hx    Esophageal cancer Neg Hx    Rectal cancer Neg Hx    Stomach cancer Neg Hx     Social History   Socioeconomic History   Marital status: Married    Spouse name: Not on file   Number of children: Not on file   Years of education: Not on file   Highest education level: 12th grade  Occupational History   Not on file  Tobacco Use   Smoking status: Former   Smokeless tobacco: Current    Types: Chew  Vaping Use   Vaping status: Never Used  Substance and Sexual Activity   Alcohol use: No    Alcohol/week: 0.0 standard drinks of alcohol    Comment: 12 pk week--Quit Dec 2017   Drug use: No   Sexual activity: Not on file  Other Topics Concern   Not on file  Social History Narrative   From [place]   Married, 1992   2 kids (daughter Alyse July, son Jessee Mormon)  Works for [employer] (contracting)   Activity: stays somewhat active at work but no regular exercise   Diet: lots of milk, some water , some fruits/vegetables   Social Drivers of Corporate investment banker Strain: Low Risk  (03/17/2023)   Overall Financial Resource Strain (CARDIA)    Difficulty of Paying Living Expenses: Not hard at all  Food Insecurity: No Food Insecurity (12/21/2023)   Hunger Vital Sign    Worried About Running Out of Food in the Last Year: Never true    Ran Out of Food in the Last Year: Never true  Transportation Needs: No Transportation Needs (12/21/2023)   PRAPARE - Administrator, Civil Service (Medical): No    Lack of Transportation (Non-Medical): No  Physical Activity: Unknown (03/17/2023)   Exercise  Vital Sign    Days of Exercise per Week: 0 days    Minutes of Exercise per Session: Not on file  Stress: No Stress Concern Present (03/17/2023)   [employer] of Occupational Health - Occupational Stress Questionnaire    Feeling of Stress : Only a little  Social Connections: Unknown (03/17/2023)   Social Connection and Isolation Panel [NHANES]    Frequency of Communication with Friends and Family: Patient declined    Frequency of Social Gatherings with Friends and Family: Patient declined    Attends Religious Services: More than 4 times per year    Active Member of [employer] or Organizations: No    Attends Engineer, structural: Not on file    Marital Status: Married  Catering manager Violence: Not At Risk (12/21/2023)   Humiliation, Afraid, Rape, and Kick questionnaire    Fear of Current or Ex-Partner: No    Emotionally Abused: No    Physically Abused: No    Sexually Abused: No   Review of Systems TKR in January---doing well from that Sleeps well---but still has daytime somnolence. Does have sleep evaluation this month Appetite is fine Weight stable No regular exercise--but walks a lot with wife    Objective:   Physical Exam Constitutional:      Appearance: Normal appearance.  Neurological:     Mental Status: He is alert.  Psychiatric:        Mood and Affect: Mood normal.        Behavior: Behavior normal.            Assessment & Plan:

## 2024-04-23 NOTE — Assessment & Plan Note (Signed)
 Doing okay with monthly naltrexone  and gabapentin

## 2024-05-14 ENCOUNTER — Telehealth: Payer: Self-pay

## 2024-05-14 NOTE — Telephone Encounter (Signed)
 VOB has been submitted for Durolane, right knee

## 2024-05-21 ENCOUNTER — Other Ambulatory Visit: Payer: Self-pay | Admitting: Internal Medicine

## 2024-05-21 ENCOUNTER — Other Ambulatory Visit: Payer: Self-pay

## 2024-05-21 NOTE — Telephone Encounter (Signed)
 This is a duplicate request. CMAs do not have authority to delete controlled medications.

## 2024-05-21 NOTE — Telephone Encounter (Signed)
 Last written 04-23-24 #30 Last OV 04-23-24 Next OV 10-23-24 [facility]

## 2024-05-22 ENCOUNTER — Other Ambulatory Visit: Payer: Self-pay

## 2024-05-23 ENCOUNTER — Other Ambulatory Visit: Payer: Self-pay

## 2024-05-23 MED FILL — Lisdexamfetamine Dimesylate Cap 60 MG: ORAL | 30 days supply | Qty: 30 | Fill #0 | Status: AC

## 2024-05-23 NOTE — Telephone Encounter (Signed)
 ERx

## 2024-06-13 ENCOUNTER — Telehealth: Payer: Self-pay

## 2024-06-13 NOTE — Telephone Encounter (Signed)
 Patient scheduled.

## 2024-06-19 ENCOUNTER — Other Ambulatory Visit: Payer: Self-pay | Admitting: Internal Medicine

## 2024-06-20 ENCOUNTER — Other Ambulatory Visit: Payer: Self-pay | Admitting: Internal Medicine

## 2024-06-20 ENCOUNTER — Other Ambulatory Visit: Payer: Self-pay

## 2024-06-21 ENCOUNTER — Other Ambulatory Visit: Payer: Self-pay

## 2024-06-21 NOTE — Telephone Encounter (Signed)
 Refill request for   lisdexamfetamine (VYVANSE ) 60 MG capsule    LOV - 04/23/24 Next OV - 10/23/24 Last refill - 05/23/24 #30/0

## 2024-06-23 ENCOUNTER — Other Ambulatory Visit: Payer: Self-pay

## 2024-06-23 MED FILL — Lisdexamfetamine Dimesylate Cap 60 MG: ORAL | 30 days supply | Qty: 30 | Fill #0 | Status: AC

## 2024-06-23 NOTE — Telephone Encounter (Signed)
 ERx

## 2024-06-24 ENCOUNTER — Other Ambulatory Visit: Payer: Self-pay

## 2024-07-01 ENCOUNTER — Encounter: Payer: Self-pay | Admitting: Surgical

## 2024-07-01 ENCOUNTER — Ambulatory Visit: Admitting: Surgical

## 2024-07-01 ENCOUNTER — Other Ambulatory Visit (INDEPENDENT_AMBULATORY_CARE_PROVIDER_SITE_OTHER): Payer: Self-pay

## 2024-07-01 DIAGNOSIS — Z96651 Presence of right artificial knee joint: Secondary | ICD-10-CM

## 2024-07-01 DIAGNOSIS — M25561 Pain in right knee: Secondary | ICD-10-CM | POA: Diagnosis not present

## 2024-07-01 NOTE — Progress Notes (Signed)
 Post-Op Visit Note   Patient: Danny [name]           Date of Birth: Dec 03, 1970           MRN: [id number] Visit Date: 07/01/2024 PCP: Rilla Baller, MD   Assessment & Plan:  Chief Complaint:  Chief Complaint  Patient presents with   Right Knee - Pain   Visit Diagnoses:  1. Right knee pain, unspecified chronicity   2. S/P total knee arthroplasty, right     Plan: Patient is a 53 year old male who presents s/p right total knee arthroplasty on 12/21/2023.  Overall he feels he is doing better and better as he gets out from surgery but he does have some concerns.  He has mechanical clicking sensation in the knee at times.  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  Denies any other pain but also notes numbness primarily on the lateral aspect of the knee.  No fevers or chills.  The pain that wakes him up from sleep at night.  On exam, patient has range of motion from 0 degrees extension to 115 degrees of knee flexion of the right knee.  Incision is well-healed.  Small effusion present.  No sinus tract noted.  No cellulitis or skin changes.  No calf tenderness.  Negative Homans' sign.  Palpable DP pulse.  Intact ankle dorsiflexion and plantarflexion.  Able to perform straight leg raise with excellent quad strength.  Excellent hamstring strength.  No pain with hip range of motion.  Plan at this time is observation.  Symptoms are improving and all questions answered.  He understands that it is normal to have some mechanical clicking sensation after knee replacement.  Also understands that numbness on the lateral aspect of the incision is also normal and this may improve by 1 to 2 years postop.  Overall he is very satisfied with his outcome and I recommended that if he has any lingering concerns around 1 year postop, he may make an appointment and return.  Follow-Up Instructions: No  follow-ups on file.   Orders:  Orders Placed This Encounter  Procedures   XR Knee 1-2 Views Right   No orders of the defined types were placed in this encounter.   Imaging: No results found.  PMFS History: Patient Active Problem List   Diagnosis Date Noted   S/P total knee replacement, right 12/21/2023   Attention deficit hyperactivity disorder (ADHD), combined type, moderate 07/11/2022   Daytime somnolence 05/19/2020   Arthritis of right knee 05/24/2019   Family history of prostate cancer in father 02/03/2019   Neuropathy 02/06/2017   Hypertriglyceridemia 10/08/2016   Moderate alcohol use disorder (HCC) 07/15/2016   Obesity, Class I, BMI 30.0-34.9 (see actual BMI) 10/12/2015   Health maintenance examination 10/12/2015   OCD (obsessive compulsive disorder) 08/06/2015   Chronic congestion of paranasal sinus 08/06/2015   Mood disorder (HCC)    Past Medical History:  Diagnosis Date   Anxiety and depression    states controlled with meds   Arthritis    generalized   COVID-19 virus infection 09/2019   tested positive   Headache(784.0)    sinus headaches   Impaired glucose tolerance    denies diabetes- states glucose elevated x 1 in past   Multiple allergies    seansonal,environmental   Perianal fistula, posterior midline 02/06/2012   Pilonidal cyst  Family History  Problem Relation Age of Onset   Depression Mother    Prostate cancer Father 34   Colon polyps Father 55   Anxiety disorder Father    Heart disease Maternal Grandfather    Cancer Paternal Grandfather        lung   Colon cancer Neg Hx    Esophageal cancer Neg Hx    Rectal cancer Neg Hx    Stomach cancer Neg Hx     Past Surgical History:  Procedure Laterality Date   ANAL FISTULECTOMY  03/08/2012   EUA   ANTERIOR CRUCIATE LIGAMENT REPAIR  11/21/2006   right   COLONOSCOPY  12/2021   diverticulosis, int hem, rpt 10 yrs ([facility])   SHOULDER ARTHROSCOPY WITH ROTATOR CUFF REPAIR AND SUBACROMIAL  DECOMPRESSION Right 12/10/2013   Procedure: SHOULDER ARTHROSCOPY WITH SUBACROMIAL DECOMPRESSION, DISTAL CALVICLE RESECTION, OPEN ROTATOR CUFF REPAIR.;  Surgeon: Maude LELON Right, MD;  Location: [facility];  Service: Orthopedics;  Laterality: Right;   TOTAL KNEE ARTHROPLASTY Right 12/21/2023   Procedure: RIGHT TOTAL KNEE ARTHROPLASTY;  Surgeon: Addie Cordella Hamilton, MD;  Location: [facility] OR;  Service: Orthopedics;  Laterality: Right;   VASECTOMY  11/21/1998   WISDOM TOOTH EXTRACTION     right  lower   Social History   Occupational History   Not on file  Tobacco Use   Smoking status: Former   Smokeless tobacco: Current    Types: Chew  Vaping Use   Vaping status: Never Used  Substance and Sexual Activity   Alcohol use: No    Alcohol/week: 0.0 standard drinks of alcohol    Comment: 12 pk week--Quit Dec 2017   Drug use: No   Sexual activity: Not on file

## 2024-07-25 ENCOUNTER — Other Ambulatory Visit: Payer: Self-pay | Admitting: Internal Medicine

## 2024-07-25 ENCOUNTER — Other Ambulatory Visit: Payer: Self-pay | Admitting: Family Medicine

## 2024-07-25 ENCOUNTER — Other Ambulatory Visit: Payer: Self-pay

## 2024-07-25 DIAGNOSIS — F902 Attention-deficit hyperactivity disorder, combined type: Secondary | ICD-10-CM

## 2024-07-25 MED FILL — Lisdexamfetamine Dimesylate Cap 60 MG: ORAL | 30 days supply | Qty: 30 | Fill #0 | Status: AC

## 2024-07-25 NOTE — Telephone Encounter (Signed)
 Name of Medication:  Vyvanse  Name of Pharmacy:  [facility] Last Fill or Written Date and Quantity:  06/24/24, #30 Last Office Visit and Type:  04/23/24, mood f/u (w/ Dr Jimmy) Next Office Visit and Type:  10/23/24, CPE Last Controlled Substance Agreement Date:  11/30/22 Last UDS:  11/30/22

## 2024-07-25 NOTE — Telephone Encounter (Signed)
 ERx

## 2024-07-26 ENCOUNTER — Other Ambulatory Visit: Payer: Self-pay

## 2024-08-19 ENCOUNTER — Ambulatory Visit (INDEPENDENT_AMBULATORY_CARE_PROVIDER_SITE_OTHER): Admitting: Family Medicine

## 2024-08-19 ENCOUNTER — Other Ambulatory Visit: Payer: Self-pay

## 2024-08-19 ENCOUNTER — Encounter: Payer: Self-pay | Admitting: Family Medicine

## 2024-08-19 VITALS — BP 128/72 | HR 80 | Temp 98.4°F | Ht 69.69 in | Wt 231.5 lb

## 2024-08-19 DIAGNOSIS — F39 Unspecified mood [affective] disorder: Secondary | ICD-10-CM

## 2024-08-19 DIAGNOSIS — J019 Acute sinusitis, unspecified: Secondary | ICD-10-CM | POA: Diagnosis not present

## 2024-08-19 DIAGNOSIS — F1020 Alcohol dependence, uncomplicated: Secondary | ICD-10-CM | POA: Diagnosis not present

## 2024-08-19 DIAGNOSIS — F902 Attention-deficit hyperactivity disorder, combined type: Secondary | ICD-10-CM | POA: Diagnosis not present

## 2024-08-19 MED ORDER — AMOXICILLIN-POT CLAVULANATE 875-125 MG PO TABS: 1.0000 | ORAL_TABLET | Freq: Two times a day (BID) | ORAL | 0 refills | Status: AC

## 2024-08-19 MED ORDER — AMPHETAMINE-DEXTROAMPHET ER 30 MG PO CP24
30.0000 mg | ORAL_CAPSULE | Freq: Every day | ORAL | 0 refills | Status: DC
  Filled 2024-08-19: qty 30, 30d supply, fill #0

## 2024-08-19 NOTE — Assessment & Plan Note (Signed)
 He is no longer on gabapentin, naltrexone  - feels it wasn't helpful.  Continue to encourage limiting alcohol intake.

## 2024-08-19 NOTE — Assessment & Plan Note (Signed)
Stable period on celexa 20mg daily - continue.  

## 2024-08-19 NOTE — Assessment & Plan Note (Signed)
 Will treat for bacterial sinusitis given duration and progression of symptoms. Rx augmentin  10d course. See pt instructions for further supportive measures reviewed. Update if not improving with treatment. Pt agrees with plan.

## 2024-08-19 NOTE — Patient Instructions (Addendum)
 You likely do have a sinus infection. Take medicine as prescribed: augmentin  10 day course.  Push fluids and plenty of rest. Nasal saline irrigation or neti pot to help drain sinuses. Continue flonase . May use plain mucinex  (or fast acting guaifenesin ) with plenty of fluid to help mobilize mucous. Please let us  know if fever >101.5, trouble opening/closing mouth, difficulty swallowing, or worsening instead of improving as expected.   For ADHD - may try adderall XR 30mg  daily in place of Vyvanse , update us  with effect.

## 2024-08-19 NOTE — Assessment & Plan Note (Signed)
 Longstanding on vyvanse  60mg  daily - this dose was increased on 04/2024.  Notes loss of effect of vyvanse , requests trial of Adderall XR which is reasonable. Start adderall XR 30mg  daily. Reassess at CPE in 3 months.

## 2024-08-19 NOTE — Progress Notes (Signed)
 Ph: [phone] Fax: [phone]   Patient ID: Danny [name], male    DOB: Mar 31, 1971, 53 y.o.   MRN: [id number]  This visit was conducted in person.  BP 128/72   Pulse 80   Temp 98.4 F (36.9 C) (Oral)   Ht 5' 9.69 (1.77 m)   Wt 231 lb 8 oz (105 kg)   SpO2 98%   BMI 33.51 kg/m   BP Readings from Last 3 Encounters:  08/19/24 128/72  04/23/24 136/78  12/22/23 135/73    CC: HA, sinus pressure  Subjective:   HPI: Danny [name] is a 53 y.o. male presenting on 08/19/2024 for Sinus Problem (C/o severe sinus pressure/pain and HA. Sxs started about 2 wks ago. H/o sinus problem about the same time every yr. Tried saline rinse and Tylenol  Sinus. )   2+ wk h/o frontal sinus pressure headache, pain, cough productive of mucous, head congestion. + PNDrainage, mild ST, tooth pain.   Tried nasal saline rinse, flonase , tylenol  sinus treatment OTC with limited benefit. Limited afrin nasal spray.   No fevers/chills, dyspnea, ear pain.   ADHD - feels vyvanse  60mg  not as effective as previously - dose was increased 04/2024. Takes at 6:30am. Notes it wears off around lunch time.  Alcohol - now off gabapentin, naltrexone . Endorses seldom alcohol intake.  To be new grandfather 10/2023!   Alcohol - endorses only drinking a few beers on weekends, had 2 this past Friday but not more.      Relevant past medical, surgical, family and social history reviewed and updated as indicated. Interim medical history since our last visit reviewed. Allergies and medications reviewed and updated. Outpatient Medications Prior to Visit  Medication Sig Dispense Refill   citalopram  (CELEXA ) 20 MG tablet Take 1 tablet (20 mg total) by mouth daily. 90 tablet 1   lisdexamfetamine (VYVANSE ) 60 MG capsule Take 1 capsule (60 mg total) by mouth every morning. 30 capsule 0   gabapentin (NEURONTIN) 300 MG capsule Take 300 mg by mouth 4 (four) times daily.     No facility-administered medications prior to visit.      Per HPI unless specifically indicated in ROS section below Review of Systems  Objective:  BP 128/72   Pulse 80   Temp 98.4 F (36.9 C) (Oral)   Ht 5' 9.69 (1.77 m)   Wt 231 lb 8 oz (105 kg)   SpO2 98%   BMI 33.51 kg/m   Wt Readings from Last 3 Encounters:  08/19/24 231 lb 8 oz (105 kg)  04/23/24 229 lb 2 oz (103.9 kg)  12/21/23 233 lb 11 oz (106 kg)      Physical Exam Vitals and nursing note reviewed.  Constitutional:      Appearance: Normal appearance. He is not ill-appearing.  HENT:     Head: Normocephalic and atraumatic.     Right Ear: Hearing, tympanic membrane, ear canal and external ear normal. There is no impacted cerumen.     Left Ear: Hearing, ear canal and external ear normal. There is no impacted cerumen. Tympanic membrane is erythematous.     Ears:     Comments: L TM erythema without pain    Nose: No mucosal edema.     Right Turbinates: Not enlarged or swollen.     Left Turbinates: Not enlarged or swollen.     Right Sinus: Maxillary sinus tenderness and frontal sinus tenderness present.     Left Sinus: Maxillary sinus tenderness and frontal sinus tenderness present.  Mouth/Throat:     Mouth: Mucous membranes are moist.     Pharynx: Oropharynx is clear. Posterior oropharyngeal erythema present. No oropharyngeal exudate.  Eyes:     Extraocular Movements: Extraocular movements intact.     Conjunctiva/sclera: Conjunctivae normal.     Pupils: Pupils are equal, round, and reactive to light.  Cardiovascular:     Rate and Rhythm: Normal rate and regular rhythm.     Pulses: Normal pulses.     Heart sounds: Normal heart sounds. No murmur heard. Pulmonary:     Effort: Pulmonary effort is normal. No respiratory distress.     Breath sounds: Normal breath sounds. No wheezing, rhonchi or rales.  Musculoskeletal:     Cervical back: Normal range of motion and neck supple. No rigidity.     Right lower leg: No edema.     Left lower leg: No edema.  Lymphadenopathy:      Cervical: No cervical adenopathy.  Skin:    General: Skin is warm and dry.     Findings: No rash.  Neurological:     Mental Status: He is alert.  Psychiatric:        Mood and Affect: Mood normal.        Behavior: Behavior normal.        Assessment & Plan:   Problem List Items Addressed This Visit     Mood disorder   Stable period on celexa  20mg  daily - continue.      Moderate alcohol use disorder (HCC)   He is no longer on gabapentin, naltrexone  - feels it wasn't helpful.  Continue to encourage limiting alcohol intake.       Acute sinusitis - Primary   Will treat for bacterial sinusitis given duration and progression of symptoms. Rx augmentin  10d course. See pt instructions for further supportive measures reviewed. Update if not improving with treatment. Pt agrees with plan.       Relevant Medications   amoxicillin -clavulanate (AUGMENTIN ) 875-125 MG tablet   Attention deficit hyperactivity disorder (ADHD), combined type, moderate   Longstanding on vyvanse  60mg  daily - this dose was increased on 04/2024.  Notes loss of effect of vyvanse , requests trial of Adderall XR which is reasonable. Start adderall XR 30mg  daily. Reassess at CPE in 3 months.         Meds ordered this encounter  Medications   amoxicillin -clavulanate (AUGMENTIN ) 875-125 MG tablet    Sig: Take 1 tablet by mouth 2 (two) times daily for 10 days.    Dispense:  20 tablet    Refill:  0   amphetamine-dextroamphetamine (ADDERALL XR) 30 MG 24 hr capsule    Sig: Take 1 capsule (30 mg total) by mouth daily.    Dispense:  30 capsule    Refill:  0    To replace Vyvanse     No orders of the defined types were placed in this encounter.   Patient Instructions  You likely do have a sinus infection. Take medicine as prescribed: augmentin  10 day course.  Push fluids and plenty of rest. Nasal saline irrigation or neti pot to help drain sinuses. Continue flonase . May use plain mucinex  (or fast acting  guaifenesin ) with plenty of fluid to help mobilize mucous. Please let us  know if fever >101.5, trouble opening/closing mouth, difficulty swallowing, or worsening instead of improving as expected.   For ADHD - may try adderall XR 30mg  daily in place of Vyvanse , update us  with effect.   Follow up plan: Return if symptoms worsen or  fail to improve.  Anton Blas, MD

## 2024-08-30 ENCOUNTER — Other Ambulatory Visit: Payer: Self-pay | Admitting: Family Medicine

## 2024-08-30 ENCOUNTER — Ambulatory Visit: Payer: Self-pay

## 2024-08-30 MED ORDER — DOXYCYCLINE HYCLATE 100 MG PO TABS: 100.0000 mg | ORAL_TABLET | Freq: Two times a day (BID) | ORAL | 0 refills | Status: DC

## 2024-08-30 NOTE — Telephone Encounter (Signed)
 Patient was seen on 9/29 for sinus concerns. States on Augmentin  for 10 days. Patient states he still has a couple of pills left. Patient reports symptoms are still lingering-not worsening. Patient reports continued sinus pain and pressure behind his eyes and his forehead. Patient is asking if he is needing another medication to help clear up his symptoms. Discussed with patient being reevaluated in the office but patient refused. Patient is asking if provider can follow up with him. Patient is aware that due to the time, unable to say that office will be able to address the note. Patient states if he doesn't hear anything he will go to urgent care.   FYI Only or Action Required?: Action required by provider: clinical question for provider.  Patient was last seen in primary care on 08/19/2024 by Rilla Baller, MD.  Called Nurse Triage reporting Facial Pain.  Symptoms began several days ago.  Interventions attempted: Prescription medications: Augmentin  and Rest, hydration, or home remedies.  Symptoms are: unchanged.  Triage Disposition: See PCP When Office is Open (Within 3 Days)  Patient/caregiver understands and will follow disposition?: No, wishes to speak with PCP  Copied from CRM [id number]. Topic: Clinical - Medical Advice >> Aug 30, 2024  2:19 PM [place] E wrote: Reason for CRM: Patient was seen by PCP for a sinus infection and was prescribed Augmentin , but patient is still having lingering symptoms (not worsening), so questioning if he would need a stronger medication/dosage to clear it up. Please call [phone] to discuss. Reason for Disposition  [1] Sinus congestion (pressure, fullness) AND [2] present > 10 days  Answer Assessment - Initial Assessment Questions 1. LOCATION: Where does it hurt?      Behind his eyes and forehead 2. ONSET: When did the sinus pain start?  (e.g., hours, days)      Started 08/15/2024 3. SEVERITY: How bad is the pain?   (Scale 0-10; or  none, mild, moderate or severe)     4-5 out of 10 4. RECURRENT SYMPTOM: Have you ever had sinus problems before? If Yes, ask: When was the last time? and What happened that time?      yes 5. NASAL CONGESTION: Is the nose blocked? If Yes, ask: Can you open it or must you breathe through your mouth?     Yes-having to breath out of his mouth 6. NASAL DISCHARGE: Do you have discharge from your nose? If so ask, What color?     Yes-yellow 7. FEVER: Do you have a fever? If Yes, ask: What is it, how was it measured, and when did it start?      no 8. OTHER SYMPTOMS: Do you have any other symptoms? (e.g., sore throat, cough, earache, difficulty breathing)     no  Protocols used: Sinus Pain or Congestion-A-AH

## 2024-08-30 NOTE — Telephone Encounter (Signed)
 Spoke with patient. Recently treated with 10d augmentin .  Still having sinus pressure and headache.  Tylenol  sinus helps headache. No fevers, he is coughing up colored mucous.  No chest congestion.   ERx doxycycline 10d course.

## 2024-08-30 NOTE — Progress Notes (Signed)
 ERx doxycycline 10d course - see phone note.

## 2024-09-17 ENCOUNTER — Other Ambulatory Visit: Payer: Self-pay | Admitting: Family Medicine

## 2024-09-19 ENCOUNTER — Other Ambulatory Visit: Payer: Self-pay | Admitting: Family Medicine

## 2024-09-19 ENCOUNTER — Other Ambulatory Visit: Payer: Self-pay

## 2024-09-20 ENCOUNTER — Other Ambulatory Visit: Payer: Self-pay

## 2024-09-20 MED FILL — Amphetamine-Dextroamphetamine Cap ER 24HR 30 MG: ORAL | 30 days supply | Qty: 30 | Fill #0 | Status: AC

## 2024-09-20 NOTE — Telephone Encounter (Signed)
 ERx Last filled 08/19/2024

## 2024-09-23 ENCOUNTER — Encounter: Payer: Self-pay | Admitting: Radiology

## 2024-10-05 ENCOUNTER — Other Ambulatory Visit: Payer: Self-pay | Admitting: Family Medicine

## 2024-10-05 DIAGNOSIS — E781 Pure hyperglyceridemia: Secondary | ICD-10-CM

## 2024-10-05 DIAGNOSIS — Z125 Encounter for screening for malignant neoplasm of prostate: Secondary | ICD-10-CM

## 2024-10-05 DIAGNOSIS — F1020 Alcohol dependence, uncomplicated: Secondary | ICD-10-CM

## 2024-10-11 ENCOUNTER — Other Ambulatory Visit (INDEPENDENT_AMBULATORY_CARE_PROVIDER_SITE_OTHER)

## 2024-10-11 DIAGNOSIS — E781 Pure hyperglyceridemia: Secondary | ICD-10-CM | POA: Diagnosis not present

## 2024-10-11 DIAGNOSIS — F1020 Alcohol dependence, uncomplicated: Secondary | ICD-10-CM | POA: Diagnosis not present

## 2024-10-11 DIAGNOSIS — Z125 Encounter for screening for malignant neoplasm of prostate: Secondary | ICD-10-CM

## 2024-10-11 LAB — CBC WITH DIFFERENTIAL/PLATELET
Basophils Absolute: 0.1 K/uL (ref 0.0–0.1)
Basophils Relative: 1.3 % (ref 0.0–3.0)
Eosinophils Absolute: 0.2 K/uL (ref 0.0–0.7)
Eosinophils Relative: 3.4 % (ref 0.0–5.0)
HCT: 43.8 % (ref 39.0–52.0)
Hemoglobin: 15.2 g/dL (ref 13.0–17.0)
Lymphocytes Relative: 30.5 % (ref 12.0–46.0)
Lymphs Abs: 1.4 K/uL (ref 0.7–4.0)
MCHC: 34.7 g/dL (ref 30.0–36.0)
MCV: 88.9 fl (ref 78.0–100.0)
Monocytes Absolute: 0.6 K/uL (ref 0.1–1.0)
Monocytes Relative: 13.2 % — ABNORMAL HIGH (ref 3.0–12.0)
Neutro Abs: 2.4 K/uL (ref 1.4–7.7)
Neutrophils Relative %: 51.6 % (ref 43.0–77.0)
Platelets: 221 K/uL (ref 150.0–400.0)
RBC: 4.93 Mil/uL (ref 4.22–5.81)
RDW: 12.7 % (ref 11.5–15.5)
WBC: 4.6 K/uL (ref 4.0–10.5)

## 2024-10-11 LAB — COMPREHENSIVE METABOLIC PANEL WITH GFR
ALT: 13 U/L (ref 0–53)
AST: 15 U/L (ref 0–37)
Albumin: 4.2 g/dL (ref 3.5–5.2)
Alkaline Phosphatase: 69 U/L (ref 39–117)
BUN: 17 mg/dL (ref 6–23)
CO2: 28 meq/L (ref 19–32)
Calcium: 9.1 mg/dL (ref 8.4–10.5)
Chloride: 103 meq/L (ref 96–112)
Creatinine, Ser: 0.75 mg/dL (ref 0.40–1.50)
GFR: 103.17 mL/min (ref >60.00)
Glucose, Bld: 104 mg/dL — ABNORMAL HIGH (ref 70–99)
Potassium: 4.6 meq/L (ref 3.5–5.1)
Sodium: 138 meq/L (ref 135–145)
Total Bilirubin: 0.5 mg/dL (ref 0.2–1.2)
Total Protein: 6.5 g/dL (ref 6.0–8.3)

## 2024-10-11 LAB — LIPID PANEL
Cholesterol: 192 mg/dL (ref 0–200)
HDL: 47.1 mg/dL (ref >39.00)
LDL Cholesterol: 114 mg/dL — ABNORMAL HIGH (ref 0–99)
NonHDL: 145.22
Total CHOL/HDL Ratio: 4
Triglycerides: 154 mg/dL — ABNORMAL HIGH (ref 0.0–149.0)
VLDL: 30.8 mg/dL (ref 0.0–40.0)

## 2024-10-11 LAB — PSA: PSA: 0.5 ng/mL (ref 0.10–4.00)

## 2024-10-11 LAB — FOLATE: Folate: 10.1 ng/mL (ref >5.9)

## 2024-10-11 LAB — VITAMIN B12: Vitamin B-12: 306 pg/mL (ref 211–911)

## 2024-10-14 ENCOUNTER — Ambulatory Visit: Payer: Self-pay | Admitting: Family Medicine

## 2024-10-15 LAB — VITAMIN B1: Vitamin B1 (Thiamine): 12 nmol/L (ref 8–30)

## 2024-10-19 ENCOUNTER — Other Ambulatory Visit: Payer: Self-pay | Admitting: Family Medicine

## 2024-10-21 ENCOUNTER — Other Ambulatory Visit: Payer: Self-pay

## 2024-10-21 ENCOUNTER — Other Ambulatory Visit: Payer: Self-pay | Admitting: Family Medicine

## 2024-10-21 DIAGNOSIS — F902 Attention-deficit hyperactivity disorder, combined type: Secondary | ICD-10-CM

## 2024-10-21 NOTE — Telephone Encounter (Signed)
 Name of Medication:  Adderall XR  Name of Pharmacy:  [facility] Last Fill or Written Date and Quantity:  09/20/24, #30 Last Office Visit and Type:  08/19/24 Next Office Visit and Type:  12/03/24, CPE Last Controlled Substance Agreement Date:  11/30/22 Last UDS:  11/30/22

## 2024-10-22 ENCOUNTER — Other Ambulatory Visit: Payer: Self-pay

## 2024-10-22 MED FILL — Amphetamine-Dextroamphetamine Cap ER 24HR 30 MG: ORAL | 30 days supply | Qty: 30 | Fill #0 | Status: AC

## 2024-10-22 NOTE — Telephone Encounter (Signed)
 ERx

## 2024-10-23 ENCOUNTER — Encounter: Admitting: Family Medicine

## 2024-11-21 ENCOUNTER — Other Ambulatory Visit: Payer: Self-pay | Admitting: Family Medicine

## 2024-11-21 DIAGNOSIS — F902 Attention-deficit hyperactivity disorder, combined type: Secondary | ICD-10-CM

## 2024-11-22 ENCOUNTER — Other Ambulatory Visit: Payer: Self-pay

## 2024-11-22 MED ORDER — AMPHETAMINE-DEXTROAMPHET ER 30 MG PO CP24
30.0000 mg | ORAL_CAPSULE | Freq: Every day | ORAL | 0 refills | Status: DC
  Filled 2024-11-22: qty 30, 30d supply, fill #0

## 2024-11-22 NOTE — Telephone Encounter (Signed)
 ERx

## 2024-11-22 NOTE — Telephone Encounter (Signed)
 Requesting: Adderall XR  Contract: No UDS:  Last Visit: 08/19/2024 Next Visit: 12/03/2024 Last Refill: 10/22/24  Please Advise

## 2024-11-23 ENCOUNTER — Other Ambulatory Visit: Payer: Self-pay

## 2024-11-25 ENCOUNTER — Other Ambulatory Visit: Payer: Self-pay

## 2024-11-27 ENCOUNTER — Other Ambulatory Visit: Payer: Self-pay

## 2024-11-29 ENCOUNTER — Other Ambulatory Visit: Payer: Self-pay

## 2024-12-03 ENCOUNTER — Encounter: Payer: Self-pay | Admitting: Family Medicine

## 2024-12-03 ENCOUNTER — Other Ambulatory Visit: Payer: Self-pay

## 2024-12-03 ENCOUNTER — Ambulatory Visit: Admitting: Family Medicine

## 2024-12-03 VITALS — BP 150/86 | HR 74 | Temp 97.8°F | Ht 69.68 in | Wt 238.0 lb

## 2024-12-03 DIAGNOSIS — Z0000 Encounter for general adult medical examination without abnormal findings: Secondary | ICD-10-CM

## 2024-12-03 DIAGNOSIS — F39 Unspecified mood [affective] disorder: Secondary | ICD-10-CM

## 2024-12-03 DIAGNOSIS — E538 Deficiency of other specified B group vitamins: Secondary | ICD-10-CM | POA: Diagnosis not present

## 2024-12-03 DIAGNOSIS — R030 Elevated blood-pressure reading, without diagnosis of hypertension: Secondary | ICD-10-CM

## 2024-12-03 DIAGNOSIS — E781 Pure hyperglyceridemia: Secondary | ICD-10-CM

## 2024-12-03 DIAGNOSIS — F1091 Alcohol use, unspecified, in remission: Secondary | ICD-10-CM | POA: Diagnosis not present

## 2024-12-03 DIAGNOSIS — F902 Attention-deficit hyperactivity disorder, combined type: Secondary | ICD-10-CM

## 2024-12-03 DIAGNOSIS — J019 Acute sinusitis, unspecified: Secondary | ICD-10-CM | POA: Diagnosis not present

## 2024-12-03 DIAGNOSIS — E66811 Obesity, class 1: Secondary | ICD-10-CM

## 2024-12-03 MED ORDER — CITALOPRAM HYDROBROMIDE 20 MG PO TABS: 20.0000 mg | ORAL_TABLET | Freq: Every day | ORAL | 3 refills | Status: AC

## 2024-12-03 MED ORDER — AMPHETAMINE-DEXTROAMPHET ER 30 MG PO CP24
30.0000 mg | ORAL_CAPSULE | ORAL | 0 refills | Status: AC
  Filled 2024-12-03: qty 30, 30d supply, fill #0

## 2024-12-03 MED ORDER — AMPHETAMINE-DEXTROAMPHET ER 30 MG PO CP24: 30.0000 mg | ORAL_CAPSULE | ORAL | 0 refills | Status: AC

## 2024-12-03 MED ORDER — DOXYCYCLINE HYCLATE 100 MG PO TABS: 100.0000 mg | ORAL_TABLET | Freq: Two times a day (BID) | ORAL | 0 refills | Status: AC

## 2024-12-03 MED ORDER — AMPHETAMINE-DEXTROAMPHET ER 30 MG PO CP24: 30.0000 mg | ORAL_CAPSULE | Freq: Every day | ORAL | 0 refills | Status: AC

## 2024-12-03 NOTE — Assessment & Plan Note (Signed)
 Preventative protocols reviewed and updated unless pt declined. Discussed healthy diet and lifestyle.

## 2024-12-03 NOTE — Progress Notes (Unsigned)
 " Ph: [phone] Fax: [phone]   Patient ID: Danny [name], male    DOB: 04/27/1971, 54 y.o.   MRN: [id number]  This visit was conducted in person.  BP (!) 152/86 (BP Location: Left Arm, Patient Position: Sitting, Cuff Size: Normal)   Pulse 74   Temp 97.8 F (36.6 C) (Oral)   Ht 5' 9.68 (1.77 m)   Wt 238 lb (108 kg)   SpO2 95%   BMI 34.46 kg/m   BP Readings from Last 3 Encounters:  12/03/24 (!) 152/86  08/19/24 128/72  04/23/24 136/78   CC: CPE  Subjective:   HPI: Danny [name] is a 54 y.o. male presenting on 12/03/2024 for Medical Management of Chronic Issues (Pt has had hacking cough, sore throat, headache, runny nose, since Sunday 1/11/Wants to make sure he is not contagious so he can see grandson//Pt has no other concerns at this time)   9d h/o productive cough that is clearing up. Has been managing with tylenol  sinus. Sore throat, tooth pain, head > chest congestion. Coughing and blowing out colored mucous for the past 5 days No fevers/chills, ear pain.  64mo old grandson - has been avoiding.   Alcohol abuse history - managed with naltrexone  PRN. Last year went through intensive outpatient treatment program at [facility].  Sees therapist Ashtyn Hicks, as well as newly established with psychiatrist Dr Harlene [facility]). Receiving monthly naltrexone  shot. Now off topamax.    Preventative: Colonoscopy 12/2021 - diverticulosis, int hem, rpt 10 yrs ([facility]) Prostate cancer screening - yearly screen. Fmhx prostate cancer (father age 58). No nocturia or weak stream.  Lung cancer screening - not eligible  Flu shot - declines COVID vaccine - declines  Tdap 2010. Tdap 06/2022 Prevnar-20 - discussed to consider Shingrix - discussed, declines  Seat belt use discussed.  Sunscreen use discussed. No changing moles on skin. Sees dermatologist Q42mo.  Sleep - averages 8 hours of sleep at night  Ex smoker. Quit 2011, some chewing.  H/o alcohol abuse - largely  abstinent since 09/2019, completed 6 wk intensive outpatient program 2023 Dentist q6 mo  Eye exam yearly    From [place] Married, 1992 Lives with wife and 2 kids (daughter Lum, son Hosey)  Occ: Works for [employer] (contracting) Activity: no regular exercise, walking some, yard work  Diet: good water  and milk, some fruits/vegetables      Relevant past medical, surgical, family and social history reviewed and updated as indicated. Interim medical history since our last visit reviewed. Allergies and medications reviewed and updated. Outpatient Medications Prior to Visit  Medication Sig Dispense Refill   amphetamine -dextroamphetamine  (ADDERALL XR) 30 MG 24 hr capsule Take 1 capsule (30 mg total) by mouth daily. 30 capsule 0   citalopram  (CELEXA ) 20 MG tablet Take 1 tablet (20 mg total) by mouth daily. 90 tablet 1   doxycycline  (VIBRA -TABS) 100 MG tablet Take 1 tablet (100 mg total) by mouth 2 (two) times daily. (Patient not taking: Reported on 12/03/2024) 20 tablet 0   lisdexamfetamine  (VYVANSE ) 60 MG capsule Take 1 capsule (60 mg total) by mouth every morning. (Patient not taking: Reported on 12/03/2024) 30 capsule 0   risperiDONE (RISPERDAL) 1 MG tablet Take 1 mg by mouth daily. (Patient not taking: Reported on 12/03/2024)     No facility-administered medications prior to visit.     Per HPI unless specifically indicated in ROS section below Review of Systems  Constitutional:  Negative for activity change, appetite change,  chills, fatigue, fever and unexpected weight change.  HENT:  Positive for congestion, sinus pressure and sinus pain. Negative for hearing loss.   Eyes:  Negative for visual disturbance.  Respiratory:  Positive for cough. Negative for chest tightness, shortness of breath and wheezing.   Cardiovascular:  Negative for chest pain, palpitations and leg swelling.  Gastrointestinal:  Negative for abdominal distention, abdominal pain, blood in stool, constipation,  diarrhea, nausea and vomiting.  Genitourinary:  Negative for difficulty urinating and hematuria.  Musculoskeletal:  Negative for arthralgias, myalgias and neck pain.  Skin:  Negative for rash.  Neurological:  Positive for headaches (sinus pressure). Negative for dizziness, seizures and syncope.  Hematological:  Negative for adenopathy. Does not bruise/bleed easily.  Psychiatric/Behavioral:  Negative for dysphoric mood. The patient is not nervous/anxious.     Objective:  BP (!) 152/86 (BP Location: Left Arm, Patient Position: Sitting, Cuff Size: Normal)   Pulse 74   Temp 97.8 F (36.6 C) (Oral)   Ht 5' 9.68 (1.77 m)   Wt 238 lb (108 kg)   SpO2 95%   BMI 34.46 kg/m   Wt Readings from Last 3 Encounters:  12/03/24 238 lb (108 kg)  08/19/24 231 lb 8 oz (105 kg)  04/23/24 229 lb 2 oz (103.9 kg)      Physical Exam Vitals and nursing note reviewed.  Constitutional:      General: He is not in acute distress.    Appearance: Normal appearance. He is well-developed. He is not ill-appearing.  HENT:     Head: Normocephalic and atraumatic.     Right Ear: Hearing, tympanic membrane, ear canal and external ear normal.     Left Ear: Hearing, tympanic membrane, ear canal and external ear normal.     Mouth/Throat:     Mouth: Mucous membranes are moist.     Pharynx: Oropharynx is clear. No oropharyngeal exudate or posterior oropharyngeal erythema.  Eyes:     General: No scleral icterus.    Extraocular Movements: Extraocular movements intact.     Conjunctiva/sclera: Conjunctivae normal.     Pupils: Pupils are equal, round, and reactive to light.  Neck:     Thyroid : No thyroid  mass or thyromegaly.  Cardiovascular:     Rate and Rhythm: Normal rate and regular rhythm.     Pulses: Normal pulses.          Radial pulses are 2+ on the right side and 2+ on the left side.     Heart sounds: Normal heart sounds. No murmur heard. Pulmonary:     Effort: Pulmonary effort is normal. No respiratory  distress.     Breath sounds: Normal breath sounds. No wheezing, rhonchi or rales.  Abdominal:     General: Bowel sounds are normal. There is no distension.     Palpations: Abdomen is soft. There is no mass.     Tenderness: There is no abdominal tenderness. There is no guarding or rebound.     Hernia: No hernia is present.  Musculoskeletal:        General: Normal range of motion.     Cervical back: Normal range of motion and neck supple.     Right lower leg: No edema.     Left lower leg: No edema.  Lymphadenopathy:     Cervical: No cervical adenopathy.  Skin:    General: Skin is warm and dry.     Findings: No rash.  Neurological:     General: No focal deficit present.  Mental Status: He is alert and oriented to person, place, and time.  Psychiatric:        Mood and Affect: Mood normal.        Behavior: Behavior normal.        Thought Content: Thought content normal.        Judgment: Judgment normal.       Results for orders placed or performed in visit on 10/11/24  Folate   Collection Time: 10/11/24  7:27 AM  Result Value Ref Range   Folate 10.1 >5.9 ng/mL  Vitamin B1   Collection Time: 10/11/24  7:27 AM  Result Value Ref Range   Vitamin B1 (Thiamine) 12 8 - 30 nmol/L  Vitamin B12   Collection Time: 10/11/24  7:27 AM  Result Value Ref Range   Vitamin B-12 306 211 - 911 pg/mL  PSA   Collection Time: 10/11/24  7:27 AM  Result Value Ref Range   PSA 0.50 0.10 - 4.00 ng/mL  CBC with Differential/Platelet   Collection Time: 10/11/24  7:27 AM  Result Value Ref Range   WBC 4.6 4.0 - 10.5 K/uL   RBC 4.93 4.22 - 5.81 Mil/uL   Hemoglobin 15.2 13.0 - 17.0 g/dL   HCT 56.1 60.9 - 47.9 %   MCV 88.9 78.0 - 100.0 fl   MCHC 34.7 30.0 - 36.0 g/dL   RDW 87.2 88.4 - 84.4 %   Platelets 221.0 150.0 - 400.0 K/uL   Neutrophils Relative % 51.6 43.0 - 77.0 %   Lymphocytes Relative 30.5 12.0 - 46.0 %   Monocytes Relative 13.2 (H) 3.0 - 12.0 %   Eosinophils Relative 3.4 0.0 - 5.0 %    Basophils Relative 1.3 0.0 - 3.0 %   Neutro Abs 2.4 1.4 - 7.7 K/uL   Lymphs Abs 1.4 0.7 - 4.0 K/uL   Monocytes Absolute 0.6 0.1 - 1.0 K/uL   Eosinophils Absolute 0.2 0.0 - 0.7 K/uL   Basophils Absolute 0.1 0.0 - 0.1 K/uL  Comprehensive metabolic panel with GFR   Collection Time: 10/11/24  7:27 AM  Result Value Ref Range   Sodium 138 135 - 145 mEq/L   Potassium 4.6 3.5 - 5.1 mEq/L   Chloride 103 96 - 112 mEq/L   CO2 28 19 - 32 mEq/L   Glucose, Bld 104 (H) 70 - 99 mg/dL   BUN 17 6 - 23 mg/dL   Creatinine, Ser 9.24 0.40 - 1.50 mg/dL   Total Bilirubin 0.5 0.2 - 1.2 mg/dL   Alkaline Phosphatase 69 39 - 117 U/L   AST 15 0 - 37 U/L   ALT 13 0 - 53 U/L   Total Protein 6.5 6.0 - 8.3 g/dL   Albumin  4.2 3.5 - 5.2 g/dL   GFR 896.82 >39.99 mL/min   Calcium 9.1 8.4 - 10.5 mg/dL  Lipid panel   Collection Time: 10/11/24  7:27 AM  Result Value Ref Range   Cholesterol 192 0 - 200 mg/dL   Triglycerides 845.9 (H) 0.0 - 149.0 mg/dL   HDL 52.89 >60.99 mg/dL   VLDL 69.1 0.0 - 59.9 mg/dL   LDL Cholesterol 885 (H) 0 - 99 mg/dL   Total CHOL/HDL Ratio 4    NonHDL 145.22     Assessment & Plan:   Problem List Items Addressed This Visit     Mood disorder   Attention deficit hyperactivity disorder (ADHD), combined type, moderate     No orders of the defined types were placed in this encounter.  No orders of the defined types were placed in this encounter.   There are no Patient Instructions on file for this visit.  Follow up plan: No follow-ups on file.  Anton Blas, MD   "

## 2024-12-03 NOTE — Assessment & Plan Note (Signed)
 Chronic not on medication. Reviewed diet choices to improve cholesterol control.  Maternal grandfather with heart disease (CF) at age 54 No strong fmhx CAD/MI or stroke.  The 10-year ASCVD risk score (Arnett DK, et al., 2019) is: 6.3%   Values used to calculate the score:     Age: 52 years     Clinically relevant sex: Male     Is Non-Hispanic African American: No     Diabetic: No     Tobacco smoker: No     Systolic Blood Pressure: 152 mmHg     Is BP treated: No     HDL Cholesterol: 47.1 mg/dL     Total Cholesterol: 192 mg/dL

## 2024-12-03 NOTE — Patient Instructions (Addendum)
 Update controlled substance agreement today  Consider flu shot, shingles shots, and pneumonia shot.  Consider heart CT to screen for heart disease ($100) Avoid any over the counter medicine with sinus in the name as that can raise blood pressures. You have a sinus infection. Take antibiotic doxycycline  for 10 days Push fluids and plenty of rest. Nasal saline irrigation or neti pot to help drain sinuses. May use plain mucinex  with plenty of fluid to help mobilize mucous. Please let us  know if fever >101.5, trouble opening/closing mouth, difficulty swallowing, or worsening instead of improving as expected.   Work on [employer] [facility] changes to improve cholesterol control.  Return as needed or in 1 year for next physical

## 2024-12-05 DIAGNOSIS — E538 Deficiency of other specified B group vitamins: Secondary | ICD-10-CM | POA: Insufficient documentation

## 2024-12-05 DIAGNOSIS — R030 Elevated blood-pressure reading, without diagnosis of hypertension: Secondary | ICD-10-CM | POA: Insufficient documentation

## 2024-12-05 NOTE — Assessment & Plan Note (Signed)
 Chronic, stable period on adderall XR 30mg  daily - refilled today  Update controlled substance agreement today.

## 2024-12-05 NOTE — Assessment & Plan Note (Addendum)
 Low normal levels - consider starting OTC b12 replacement

## 2024-12-05 NOTE — Assessment & Plan Note (Addendum)
 Completed intensive outpatient rehab though [facility] 2023 Continues monthly naltrexone  injections.

## 2024-12-05 NOTE — Assessment & Plan Note (Signed)
 Elevated BP readings noted today - in setting of current sinusits illness and recent OTC cold remedy use.  Rec avoid decongestant use. Rec work on weight loss Rec low sodium/salt in diet  Rec monitoring BP readings at home, let me know if consistently >140/90 to consider medication.

## 2024-12-05 NOTE — Assessment & Plan Note (Signed)
 Reviewed weight gain noted Encourage healthy diet and lifestyle choices to effect sustainable weight loss

## 2024-12-05 NOTE — Assessment & Plan Note (Signed)
 Overall stable period on celexa  20mg  daily.  ?alcohol-induced mood disorder.  He continues monthly naltrexone  shots but is no longer taking topamax.

## 2024-12-05 NOTE — Assessment & Plan Note (Signed)
 Story/exam suspicious for acute bacterial sinusitis due to symptoms duration/progression. Rx doxycycline  10d course.  Further supportive measures as per instructions

## 2025-12-01 ENCOUNTER — Other Ambulatory Visit

## 2025-12-08 ENCOUNTER — Encounter: Admitting: Family Medicine
